# Patient Record
Sex: Male | Born: 1960
Health system: Southern US, Community
[De-identification: ages and names within clinical notes are randomized; demographics above are authoritative.]

## PROBLEM LIST (undated history)

## (undated) DIAGNOSIS — I10 Essential (primary) hypertension: Secondary | ICD-10-CM

## (undated) DIAGNOSIS — C4490 Unspecified malignant neoplasm of skin, unspecified: Secondary | ICD-10-CM

## (undated) DIAGNOSIS — E669 Obesity, unspecified: Secondary | ICD-10-CM

## (undated) DIAGNOSIS — J302 Other seasonal allergic rhinitis: Secondary | ICD-10-CM

## (undated) DIAGNOSIS — I519 Heart disease, unspecified: Secondary | ICD-10-CM

## (undated) DIAGNOSIS — I4819 Other persistent atrial fibrillation: Secondary | ICD-10-CM

## (undated) DIAGNOSIS — I209 Angina pectoris, unspecified: Secondary | ICD-10-CM

## (undated) DIAGNOSIS — F32A Depression, unspecified: Secondary | ICD-10-CM

## (undated) DIAGNOSIS — F329 Major depressive disorder, single episode, unspecified: Secondary | ICD-10-CM

## (undated) HISTORY — DX: Essential (primary) hypertension: I10

## (undated) HISTORY — DX: Obesity, unspecified: E66.9

## (undated) HISTORY — PX: WISDOM TOOTH EXTRACTION: SHX21

## (undated) HISTORY — DX: Depression, unspecified: F32.A

## (undated) HISTORY — PX: COLONOSCOPY: SHX174

## (undated) HISTORY — DX: Major depressive disorder, single episode, unspecified: F32.9

---

## 2011-07-20 ENCOUNTER — Encounter: Payer: Self-pay | Admitting: Internal Medicine

## 2011-07-21 ENCOUNTER — Encounter: Payer: Self-pay | Admitting: Family Medicine

## 2011-07-21 ENCOUNTER — Ambulatory Visit (INDEPENDENT_AMBULATORY_CARE_PROVIDER_SITE_OTHER): Payer: BC Managed Care – PPO | Admitting: Family Medicine

## 2011-07-21 VITALS — BP 124/88 | HR 76 | Temp 100.0°F | Wt 176.0 lb

## 2011-07-21 DIAGNOSIS — J069 Acute upper respiratory infection, unspecified: Secondary | ICD-10-CM

## 2011-07-21 DIAGNOSIS — Z8659 Personal history of other mental and behavioral disorders: Secondary | ICD-10-CM

## 2011-07-21 NOTE — Progress Notes (Signed)
  Subjective:    Patient ID: Daniel Romero, male    DOB: [birth year], 51 y.o.   MRN: [id number]  HPI He has a four-day history started with cough followed by sneezing, myalgias and fatigue. No sore throat, earache. He has a new job. He did take half day off of work. He can use on meds as listed in the chart. He is being followed by Dr. Emerson Monte. He does plan to start back into counseling. He recognizes the need to not just rely on medications to help deal with some of his life stresses. Review of Systems     Objective:   Physical Exam alert and in no distress. Tympanic membranes and canals are normal. Throat is clear. Tonsils are normal. Neck is supple without adenopathy or thyromegaly. Cardiac exam shows a regular sinus rhythm without murmurs or gallops. Lungs are clear to auscultation.        Assessment & Plan:   1. Acute URI   2. H/O dysthymia    supportive care for the URI. 50% of the time was spent discussing stress and stress management. He recognizes the need to be on medications and is now willing to put some effort into changing how he response to stressful situations

## 2011-07-21 NOTE — Patient Instructions (Signed)
Drink plenty of fluids. Treat your symptoms.Upper Respiratory Infection, Adult An upper respiratory infection (URI) is also known as the common cold. It is often caused by a type of germ (virus). Colds are easily spread (contagious). You can pass it to others by kissing, coughing, sneezing, or drinking out of the same glass. Usually, you get better in 1 or 2 weeks.  HOME CARE   Only take medicine as told by your doctor.   Use a warm mist humidifier or breathe in steam from a hot shower.   Drink enough water and fluids to keep your pee (urine) clear or pale yellow.   Get plenty of rest.   Return to work when your temperature is back to normal or as told by your doctor. You may use a face mask and wash your hands to stop your cold from spreading.  GET HELP RIGHT AWAY IF:   After the first few days, you feel you are getting worse.   You have questions about your medicine.   You have chills, shortness of breath, or brown or red spit (mucus).   You have yellow or brown snot (nasal discharge) or pain in the face, especially when you bend forward.   You have a fever, puffy (swollen) neck, pain when you swallow, or white spots in the back of your throat.   You have a bad headache, ear pain, sinus pain, or chest pain.   You have a high-pitched whistling sound when you breathe in and out (wheezing).   You have a lasting cough or cough up blood.   You have sore muscles or a stiff neck.  MAKE SURE YOU:   Understand these instructions.   Will watch your condition.   Will get help right away if you are not doing well or get worse.  Document Released: 08/24/2007 Document Revised: 02/24/2011 Document Reviewed: 07/12/2010 [facility] Patient Information 2012 [place], [place].

## 2012-04-11 ENCOUNTER — Encounter: Payer: BC Managed Care – PPO | Admitting: Family Medicine

## 2012-04-13 ENCOUNTER — Encounter: Payer: Self-pay | Admitting: Family Medicine

## 2012-04-13 ENCOUNTER — Ambulatory Visit (INDEPENDENT_AMBULATORY_CARE_PROVIDER_SITE_OTHER): Payer: BC Managed Care – PPO | Admitting: Family Medicine

## 2012-04-13 VITALS — BP 120/78 | HR 60 | Ht 71.0 in | Wt 213.0 lb

## 2012-04-13 DIAGNOSIS — F3289 Other specified depressive episodes: Secondary | ICD-10-CM

## 2012-04-13 DIAGNOSIS — F329 Major depressive disorder, single episode, unspecified: Secondary | ICD-10-CM

## 2012-04-13 DIAGNOSIS — F32A Depression, unspecified: Secondary | ICD-10-CM

## 2012-04-13 DIAGNOSIS — Z0000 Encounter for general adult medical examination without abnormal findings: Secondary | ICD-10-CM

## 2012-04-13 DIAGNOSIS — Z23 Encounter for immunization: Secondary | ICD-10-CM

## 2012-04-13 DIAGNOSIS — L989 Disorder of the skin and subcutaneous tissue, unspecified: Secondary | ICD-10-CM

## 2012-04-13 LAB — COMPREHENSIVE METABOLIC PANEL
ALT: 28 U/L (ref 0–53)
Albumin: 4.6 g/dL (ref 3.5–5.2)
Alkaline Phosphatase: 65 U/L (ref 39–117)
Glucose, Bld: 88 mg/dL (ref 70–99)
Potassium: 4.1 mEq/L (ref 3.5–5.3)
Sodium: 141 mEq/L (ref 135–145)
Total Bilirubin: 0.6 mg/dL (ref 0.3–1.2)
Total Protein: 6.8 g/dL (ref 6.0–8.3)

## 2012-04-13 LAB — CBC WITH DIFFERENTIAL/PLATELET
Basophils Relative: 0 % (ref 0–1)
Eosinophils Absolute: 0.3 10*3/uL (ref 0.0–0.7)
Hemoglobin: 16.3 g/dL (ref 13.0–17.0)
Lymphs Abs: 1.7 10*3/uL (ref 0.7–4.0)
MCHC: 35.3 g/dL (ref 30.0–36.0)
Monocytes Relative: 8 % (ref 3–12)
Neutro Abs: 4.1 10*3/uL (ref 1.7–7.7)
Neutrophils Relative %: 61 % (ref 43–77)
Platelets: 303 10*3/uL (ref 150–400)
RBC: 5.4 MIL/uL (ref 4.22–5.81)

## 2012-04-13 LAB — HEMOCCULT GUIAC POC 1CARD (OFFICE)

## 2012-04-13 LAB — LIPID PANEL
LDL Cholesterol: 121 mg/dL — ABNORMAL HIGH (ref 0–99)
Total CHOL/HDL Ratio: 3.7 Ratio
VLDL: 20 mg/dL (ref 0–40)

## 2012-04-13 NOTE — Progress Notes (Signed)
Subjective:    Patient ID: Daniel Romero, male    DOB: [birth year], 52 y.o.   MRN: [id number]  HPI He is here for complete examination. He does have a several year history of intermittent left medial knee pain that occurs monthly. No popping, locking, grinding or effusion. He also complains of a 5 year history of a pinching feeling in the left lower abdominal is worse with physical activity especially bending. He continues on Prozac and Neurontin and is followed by Dr. Loralie Champagne office the this seems to be going quite well. He also has several lesions that he would like me to evaluate. Otherwise he has no particular concerns or complaints. His work is going well. He apparently is dating someone on a regular basis. His family history is noncontributory.   Review of Systems  Constitutional: Negative.   HENT: Negative.   Eyes: Negative.   Respiratory: Negative.   Cardiovascular: Negative.   Gastrointestinal: Negative.   Genitourinary: Negative.   Musculoskeletal: Negative.   Skin: Negative.   Neurological: Negative.   Hematological: Negative.   Psychiatric/Behavioral: Negative.        Objective:   Physical Exam BP 120/78  Pulse 60  Ht 5\' 11"  (1.803 m)  Wt 213 lb (96.616 kg)  BMI 29.71 kg/m2  SpO2 98%  General Appearance:    Alert, cooperative, no distress, appears stated age  Head:    Normocephalic, without obvious abnormality, atraumatic  Eyes:    PERRL, conjunctiva/corneas clear, EOM's intact, fundi    benign  Ears:    Normal TM's and external ear canals  Nose:   Nares normal, mucosa normal, no drainage or sinus   tenderness  Throat:   Lips, mucosa, and tongue normal; teeth and gums normal  Neck:   Supple, no lymphadenopathy;  thyroid:  no   enlargement/tenderness/nodules; no carotid   bruit or JVD  Back:    Spine nontender, no curvature, ROM normal, no CVA     tenderness  Lungs:     Clear to auscultation bilaterally without wheezes, rales or     ronchi; respirations  unlabored  Chest Wall:    No tenderness or deformity   Heart:    Regular rate and rhythm, S1 and S2 normal, no murmur, rub   or gallop  Breast Exam:    No chest wall tenderness, masses or gynecomastia  Abdomen:     Soft, non-tender, nondistended, normoactive bowel sounds,    no masses, no hepatosplenomegaly  Genitalia:    Normal male external genitalia without lesions.  Testicles without masses.  No inguinal hernias.  Rectal:    Normal sphincter tone, no masses or tenderness; guaiac negative stool.  Prostate smooth, no nodules, not enlarged.  Extremities:   No clubbing, cyanosis or edema left knee exam shows negative McMurray's testing, anterior drawer. No tenderness over the medial joint line. No effusion noted.   Pulses:   2+ and symmetric all extremities  Skin:   Skin color, texture, turgor normal, no rashes, 0.5 cm lesion noted behind his right ear that is slightly raised.   Lymph nodes:   Cervical, supraclavicular, and axillary nodes normal  Neurologic:   CNII-XII intact, normal strength, sensation and gait; reflexes 2+ and symmetric throughout          Psych:   Normal mood, affect, hygiene and grooming.    Microscopic examination showed no red cells.       Assessment & Plan:   1. Routine general medical examination at a health care  facility  POCT Urinalysis Dipstick, Tdap vaccine greater than or equal to 7yo IM, Lipid panel, CBC with Differential, Comprehensive metabolic panel, Hemoccult - 1 Card (office)  2. Skin lesion    3. Depression      he will return here for lesion excision. Discussed the knee pain with him. Recommend conservative care since is causing him very little difficulty. He will continue to be followed by psychiatry.

## 2012-04-16 NOTE — Progress Notes (Signed)
Quick Note:  CALLED PT CELL # TO INFORM HIM LABS OK LEFT MESSAGE ______

## 2012-04-20 ENCOUNTER — Ambulatory Visit (INDEPENDENT_AMBULATORY_CARE_PROVIDER_SITE_OTHER): Payer: BC Managed Care – PPO | Admitting: Family Medicine

## 2012-04-20 DIAGNOSIS — L989 Disorder of the skin and subcutaneous tissue, unspecified: Secondary | ICD-10-CM

## 2012-04-20 NOTE — Progress Notes (Signed)
  Subjective:    Patient ID: Daniel Romero, male    DOB: 1960-07-11, 52 y.o.   MRN: [id number]  HPI He has a 0.5 cm raised lesions in the right posterior auricular area that he would like removed.   Review of Systems     Objective:   Physical Exam 0.5 cm raised dry a lesion is noted in the right auricular area. It was injected with Xylocaine and epinephrine. A 2 mm wedge biopsy was used and the lesion was removed without difficulty.       Assessment & Plan:   1. Skin lesion    the lesion was sent for pathologic evaluation.

## 2012-04-27 ENCOUNTER — Telehealth: Payer: Self-pay

## 2012-04-27 NOTE — Telephone Encounter (Signed)
PT INFORMED PLACE WAS PRE-CANCEROUS AND TO KEEP A WATCH ON ANY NEW PLACES TO POP UP PT VERBALIZED UNDERSTANDING

## 2012-05-02 ENCOUNTER — Encounter: Payer: Self-pay | Admitting: Family Medicine

## 2013-03-29 ENCOUNTER — Ambulatory Visit (INDEPENDENT_AMBULATORY_CARE_PROVIDER_SITE_OTHER): Payer: 59 | Admitting: Family Medicine

## 2013-03-29 ENCOUNTER — Encounter: Payer: Self-pay | Admitting: Family Medicine

## 2013-03-29 VITALS — BP 158/110 | HR 68 | Wt 203.0 lb

## 2013-03-29 DIAGNOSIS — J209 Acute bronchitis, unspecified: Secondary | ICD-10-CM

## 2013-03-29 DIAGNOSIS — IMO0001 Reserved for inherently not codable concepts without codable children: Secondary | ICD-10-CM

## 2013-03-29 DIAGNOSIS — R030 Elevated blood-pressure reading, without diagnosis of hypertension: Secondary | ICD-10-CM

## 2013-03-29 MED ORDER — AMOXICILLIN 875 MG PO TABS: 875.0000 mg | ORAL_TABLET | Freq: Two times a day (BID) | ORAL | Status: DC

## 2013-03-29 NOTE — Patient Instructions (Signed)
To call the antibiotic and if not totally back to normal when you finish the medical

## 2013-03-29 NOTE — Progress Notes (Signed)
   Subjective:    Patient ID: Daniel Romero, male    DOB: 1960-11-11, 53 y.o.   MRN: [id number]  HPI One month ago he developed difficulty with cough and chest congestion. Approximately 3 weeks ago he developed fever, chills, myalgias, headache and worsening cough and congestion. No sore throat, earache, change in appetite. Over the last week his cough and congestion have essentially remained unchanged. He does not smoke. He continues on medications listed in the chart.   Review of Systems     Objective:   Physical Exam alert and in no distress. Tympanic membranes and canals are normal. Throat is clear. Tonsils are normal. Neck is supple without adenopathy or thyromegaly. Cardiac exam shows a regular sinus rhythm without murmurs or gallops. Lungs shows scattered rhonchi. Review of his record indicates previous blood pressures have been normal.        Assessment & Plan:  Acute bronchitis - Plan: amoxicillin (AMOXIL) 875 MG tablet  Elevated blood pressure  he will take all the antibiotic and call me if not better. I will also have him return here in one month for blood pressure recheck.

## 2013-04-29 ENCOUNTER — Encounter: Payer: Self-pay | Admitting: Family Medicine

## 2013-04-29 ENCOUNTER — Ambulatory Visit (INDEPENDENT_AMBULATORY_CARE_PROVIDER_SITE_OTHER): Payer: 59 | Admitting: Family Medicine

## 2013-04-29 VITALS — BP 150/100 | HR 60 | Wt 209.0 lb

## 2013-04-29 DIAGNOSIS — I10 Essential (primary) hypertension: Secondary | ICD-10-CM

## 2013-04-29 DIAGNOSIS — F17200 Nicotine dependence, unspecified, uncomplicated: Secondary | ICD-10-CM

## 2013-04-29 MED ORDER — LISINOPRIL-HYDROCHLOROTHIAZIDE 10-12.5 MG PO TABS: 1.0000 | ORAL_TABLET | Freq: Every day | ORAL | Status: DC

## 2013-04-29 NOTE — Progress Notes (Signed)
   Subjective:    Patient ID: Daniel Romero, male    DOB: [birth year], 53 y.o.   MRN: [id number]  HPI He is here for recheck on his blood pressure. He also indicates that he smokes and has not mentioned this in the past.   Review of Systems     Objective:   Physical Exam Alert and in no distress. Blood pressure is recorded.       Assessment & Plan:  Hypertension - Plan: lisinopril-hydrochlorothiazide (PRINZIDE,ZESTORETIC) 10-12.5 MG per tablet  Current smoker  discussed treatment of his hypertension in regard to medications, diet, exercise, smoking, other medications. He will make an attempt to make diet and exercise changes. I will place him on blood pressure medication. Discussed the fact that if he does make lifestyle changes and gets his weight down and gets into a regular exercise program but we could consider stopping the medication. Very low, spent concerning his smoking. Recheck here one month.

## 2013-05-27 ENCOUNTER — Ambulatory Visit: Payer: 59 | Admitting: Medical

## 2013-06-03 ENCOUNTER — Ambulatory Visit: Payer: 59 | Admitting: Medical

## 2013-06-03 ENCOUNTER — Encounter: Payer: Self-pay | Admitting: Family Medicine

## 2013-06-03 ENCOUNTER — Ambulatory Visit (INDEPENDENT_AMBULATORY_CARE_PROVIDER_SITE_OTHER): Payer: 59 | Admitting: Family Medicine

## 2013-06-03 VITALS — BP 114/84 | HR 60 | Wt 211.0 lb

## 2013-06-03 DIAGNOSIS — Z87891 Personal history of nicotine dependence: Secondary | ICD-10-CM

## 2013-06-03 DIAGNOSIS — I10 Essential (primary) hypertension: Secondary | ICD-10-CM

## 2013-06-03 DIAGNOSIS — F1011 Alcohol abuse, in remission: Secondary | ICD-10-CM

## 2013-06-03 MED ORDER — LISINOPRIL-HYDROCHLOROTHIAZIDE 10-12.5 MG PO TABS: 1.0000 | ORAL_TABLET | Freq: Every day | ORAL | Status: DC

## 2013-06-03 NOTE — Progress Notes (Signed)
   Subjective:    Patient ID: Daniel Romero, male    DOB: 03/30/1960, 53 y.o.   MRN: [id number]  HPI He is here for a recheck after starting on lisinopril. He has no cough or swelling. Also of note is he recently quit smoking. He has also been taking Prozac and Neurontin given to him to Dr. Tomasita Crumble [facility]'s office. The gentleman he is seeing Dr. apparently has retired and he would like to potentially come off this medication. He was not involved in any kind of counseling while there. Apparently the depression was work related and he states that this is doing much better now. In the past he had been in counseling with Josph Macho May.   Review of Systems     Objective:   Physical Exam Alert and in no distress. Blood pressure is recorded.       Assessment & Plan:  Hypertension - Plan: lisinopril-hydrochlorothiazide (PRINZIDE,ZESTORETIC) 10-12.5 MG per tablet  History of alcohol abuse  Former smoker  his blood pressure medication will be renewed. Discussed counseling with him in regard to handling stress and stress management. We discussed how he is handling his alcohol and apparently he does go to [employer]. He also is using similar techniques on quitting smoking and finds them useful. I encouraged him to get back involved in counseling to help deal with other stress-related issues explaining that stress will, at some point down the Road and now sometimes when how to handle it better. May consider stopping his medications at a later date.

## 2013-06-03 NOTE — Patient Instructions (Signed)
Followup with Fred May

## 2014-06-16 ENCOUNTER — Other Ambulatory Visit: Payer: Self-pay | Admitting: Family Medicine

## 2014-07-04 ENCOUNTER — Encounter: Payer: Self-pay | Admitting: Medical

## 2014-07-04 ENCOUNTER — Ambulatory Visit (INDEPENDENT_AMBULATORY_CARE_PROVIDER_SITE_OTHER): Payer: 59 | Admitting: Medical

## 2014-07-04 VITALS — BP 110/80 | HR 52 | Temp 98.2°F | Resp 15 | Wt 190.0 lb

## 2014-07-04 DIAGNOSIS — S39012A Strain of muscle, fascia and tendon of lower back, initial encounter: Secondary | ICD-10-CM | POA: Diagnosis not present

## 2014-07-04 DIAGNOSIS — M62830 Muscle spasm of back: Secondary | ICD-10-CM

## 2014-07-04 MED ORDER — CYCLOBENZAPRINE HCL 10 MG PO TABS: 10.0000 mg | ORAL_TABLET | Freq: Every day | ORAL | Status: DC

## 2014-07-04 NOTE — Progress Notes (Signed)
  Subjective:    Daniel Romero is a 54 y.o. male who presents for evaluation of low back pain. The patient has had recurrent self limited episodes of low back pain in the past. Symptoms have been present for 1 week and are unchanged.  Onset was related to / precipitated by lifting a heavy object and was moving a 200 lb wasing machine on a hand truck down steps last week when he felt pain and knew he had pullsed his back. The pain is located in the right lumbar area and does not radiate. The pain is described as aching, soreness and stiffness and occurs all day.   Symptoms are exacerbated by exercise, extension, flexion, lifting and sitting. Symptoms are improved by NSAIDs. He has also tried nothing which provided no symptom relief. He has no other symptoms associated with the back pain. The patient has no "red flag" history indicative of complicated back pain.  The following portions of the patient's history were reviewed and updated as appropriate: allergies, current medications, past family history, past medical history, past social history, past surgical history and problem list.  Review of Systems As in subjective     Objective:    Filed Vitals:   07/04/14 1213  BP: 110/80  Pulse: 52  Temp: 98.2 F (36.8 C)  Resp: 15    General appearance: alert, no distress, WD/WN Neck: supple, no lymphadenopathy, no thyromegaly, no masses, normal ROM Abdomen: +bs, soft, non tender, non distended, no masses, no hepatomegaly, no splenomegaly, no bruits Back: tender right low back paraspinal with +spasm, flexion and extension limited due to pain, otherwise no scoliosis, no other tendnerss, -SLR MSK: UE nontender, normal ROM, LE nontender, normal ROM, no obvious deformity Extremities: no edema, no cyanosis, no clubbing Pulses: 2+ symmetric, upper and lower extremities     Assessment:   Encounter Diagnoses  Name Primary?  . Back strain, initial encounter Yes  . Muscle spasm of back      Plan:     Natural history and expected course discussed. Questions answered. Neurosurgeon distributed. Proper lifting, bending technique discussed. Stretching exercises discussed. Regular aerobic and trunk strengthening exercises discussed. Short (3-4 day) period of relative rest recommended until acute symptoms improve. Heat to affected area as needed for local pain relief.  Medications prescribed: NSAIDs - 2 Aleve OTC BID for the next 3-5 days Muscle relaxants per medication orders.  Note given for work for today  Follow up: prn

## 2014-12-11 ENCOUNTER — Ambulatory Visit (INDEPENDENT_AMBULATORY_CARE_PROVIDER_SITE_OTHER): Payer: 59 | Admitting: Family Medicine

## 2014-12-11 ENCOUNTER — Encounter: Payer: Self-pay | Admitting: Family Medicine

## 2014-12-11 VITALS — BP 112/60 | HR 55 | Ht 70.5 in | Wt 171.0 lb

## 2014-12-11 DIAGNOSIS — Z0000 Encounter for general adult medical examination without abnormal findings: Secondary | ICD-10-CM

## 2014-12-11 DIAGNOSIS — Z1211 Encounter for screening for malignant neoplasm of colon: Secondary | ICD-10-CM

## 2014-12-11 DIAGNOSIS — I10 Essential (primary) hypertension: Secondary | ICD-10-CM | POA: Diagnosis not present

## 2014-12-11 DIAGNOSIS — N509 Disorder of male genital organs, unspecified: Secondary | ICD-10-CM | POA: Diagnosis not present

## 2014-12-11 DIAGNOSIS — Z209 Contact with and (suspected) exposure to unspecified communicable disease: Secondary | ICD-10-CM

## 2014-12-11 DIAGNOSIS — Z87891 Personal history of nicotine dependence: Secondary | ICD-10-CM | POA: Diagnosis not present

## 2014-12-11 DIAGNOSIS — F1010 Alcohol abuse, uncomplicated: Secondary | ICD-10-CM | POA: Diagnosis not present

## 2014-12-11 DIAGNOSIS — F1011 Alcohol abuse, in remission: Secondary | ICD-10-CM

## 2014-12-11 DIAGNOSIS — M722 Plantar fascial fibromatosis: Secondary | ICD-10-CM | POA: Diagnosis not present

## 2014-12-11 LAB — COMPREHENSIVE METABOLIC PANEL
ALBUMIN: 4.7 g/dL (ref 3.6–5.1)
ALK PHOS: 53 U/L (ref 40–115)
ALT: 18 U/L (ref 9–46)
AST: 19 U/L (ref 10–35)
BILIRUBIN TOTAL: 0.8 mg/dL (ref 0.2–1.2)
BUN: 14 mg/dL (ref 7–25)
CO2: 25 mmol/L (ref 20–31)
Calcium: 9.3 mg/dL (ref 8.6–10.3)
Chloride: 101 mmol/L (ref 98–110)
Creat: 0.71 mg/dL (ref 0.70–1.33)
Glucose, Bld: 72 mg/dL (ref 65–99)
Potassium: 3.8 mmol/L (ref 3.5–5.3)
SODIUM: 140 mmol/L (ref 135–146)
TOTAL PROTEIN: 6.8 g/dL (ref 6.1–8.1)

## 2014-12-11 LAB — CBC WITH DIFFERENTIAL/PLATELET
BASOS PCT: 0 % (ref 0–1)
Basophils Absolute: 0 10*3/uL (ref 0.0–0.1)
Eosinophils Absolute: 0.3 10*3/uL (ref 0.0–0.7)
Eosinophils Relative: 3 % (ref 0–5)
HCT: 45.7 % (ref 39.0–52.0)
HEMOGLOBIN: 15.9 g/dL (ref 13.0–17.0)
Lymphocytes Relative: 20 % (ref 12–46)
Lymphs Abs: 1.7 10*3/uL (ref 0.7–4.0)
MCH: 30 pg (ref 26.0–34.0)
MCHC: 34.8 g/dL (ref 30.0–36.0)
MCV: 86.2 fL (ref 78.0–100.0)
MPV: 9.2 fL (ref 8.6–12.4)
Monocytes Absolute: 0.4 10*3/uL (ref 0.1–1.0)
Monocytes Relative: 5 % (ref 3–12)
NEUTROS ABS: 6.1 10*3/uL (ref 1.7–7.7)
NEUTROS PCT: 72 % (ref 43–77)
Platelets: 273 10*3/uL (ref 150–400)
RBC: 5.3 MIL/uL (ref 4.22–5.81)
RDW: 13.4 % (ref 11.5–15.5)
WBC: 8.5 10*3/uL (ref 4.0–10.5)

## 2014-12-11 LAB — POCT URINALYSIS DIPSTICK
BILIRUBIN UA: NEGATIVE
Blood, UA: POSITIVE
Glucose, UA: NEGATIVE
Ketones, UA: 0.5
LEUKOCYTES UA: NEGATIVE
Nitrite, UA: NEGATIVE
PROTEIN UA: NEGATIVE
Spec Grav, UA: >=1.03
Urobilinogen, UA: NEGATIVE
pH, UA: 6

## 2014-12-11 LAB — LIPID PANEL
CHOLESTEROL: 176 mg/dL (ref 125–200)
HDL: 76 mg/dL (ref >=40)
LDL Cholesterol: 86 mg/dL (ref <130)
TRIGLYCERIDES: 69 mg/dL (ref <150)
Total CHOL/HDL Ratio: 2.3 Ratio (ref <=5.0)
VLDL: 14 mg/dL (ref <30)

## 2014-12-11 NOTE — Progress Notes (Signed)
Subjective:    Patient ID: Daniel Romero, male    DOB: [birth year], 54 y.o.   MRN: [id number]  HPI He is here for a complete examination. He is getting involved in a relationship with a woman and would like to be tested for STD to be safe. He apparently has not been sexually active recently. He does have a history of hypertension and a previous history of smoking and alcohol abuse. He has not had any alcohol in over 5 years. Does complain of plantar fasciitis and has had this for over a year. He has been intermittently doing exercises for this. He has had no chest pain, shortness breath, GI issues. Family and social history as well as health maintenance and immunizations were reviewed.   Review of Systems  All other systems reviewed and are negative.      Objective:   Physical Exam BP 112/60 mmHg  Pulse 55  Ht 5' 10.5" (1.791 m)  Wt 171 lb (77.565 kg)  BMI 24.18 kg/m2  SpO2 99%  General Appearance:    Alert, cooperative, no distress, appears stated age  Head:    Normocephalic, without obvious abnormality, atraumatic  Eyes:    PERRL, conjunctiva/corneas clear, EOM's intact, fundi    benign  Ears:    Normal TM's and external ear canals  Nose:   Nares normal, mucosa normal, no drainage or sinus   tenderness  Throat:   Lips, mucosa, and tongue normal; teeth and gums normal  Neck:   Supple, no lymphadenopathy;  thyroid:  no   enlargement/tenderness/nodules; no carotid   bruit or JVD  Back:    Spine nontender, no curvature, ROM normal, no CVA     tenderness  Lungs:     Clear to auscultation bilaterally without wheezes, rales or     ronchi; respirations unlabored  Chest Wall:    No tenderness or deformity   Heart:    Regular rate and rhythm, S1 and S2 normal, no murmur, rub   or gallop  Breast Exam:    No chest wall tenderness, masses or gynecomastia  Abdomen:     Soft, non-tender, nondistended, normoactive bowel sounds,    no masses, no hepatosplenomegaly  Genitalia:    Normal male  external genitalia without lesions.  Testicles are normal however a round one and half centimeter movable nontranalucent  lesion is noted superior to the testing on the left.  No inguinal hernias.  Rectal:    Deferred  Extremities:   No clubbing, cyanosis or edema.Left foot exam shows good motion. Tender to palpation over the calcaneal spur.  Pulses:   2+ and symmetric all extremities  Skin:   Skin color, texture, turgor normal, no rashes or lesions  Lymph nodes:   Cervical, supraclavicular, and axillary nodes normal  Neurologic:   CNII-XII intact, normal strength, sensation and gait; reflexes 2+ and symmetric throughout          Psych:   Normal mood, affect, hygiene and grooming.          Assessment & Plan:  Routine general medical examination at a health care facility - Plan: POCT Urinalysis Dipstick, CBC with Differential/Platelet, Comprehensive metabolic panel, Lipid panel  Essential hypertension - Plan: CBC with Differential/Platelet, Comprehensive metabolic panel  History of alcohol abuse - Plan: CBC with Differential/Platelet, Comprehensive metabolic panel  Former smoker  Special screening for malignant neoplasms, colon - Plan: Ambulatory referral to Gastroenterology  Plantar fasciitis of left foot  Contact with or exposure to communicable disease -  Plan: HIV antibody, RPR  Scrotal lesion - Plan: US Scrotum Instructions given for proper care of the plantar fasciitis. Encouraged him to do this regularly for at least one month. Also discussed STD testing and again recommended condoms for any sexual activity.

## 2014-12-11 NOTE — Patient Instructions (Signed)
Plantar Fasciitis  Plantar fasciitis is a common condition that causes foot pain. It is soreness (inflammation) of the band of tough fibrous tissue on the bottom of the foot that runs from the heel bone (calcaneus) to the ball of the foot. The cause of this soreness may be from excessive standing, poor fitting shoes, running on hard surfaces, being overweight, having an abnormal walk, or overuse (this is common in runners) of the painful foot or feet. It is also common in aerobic exercise dancers and ballet dancers.  SYMPTOMS   Most people with plantar fasciitis complain of:   Severe pain in the morning on the bottom of their foot especially when taking the first steps out of bed. This pain recedes after a few minutes of walking.   Severe pain is experienced also during walking following a long period of inactivity.   Pain is worse when walking barefoot or up stairs  DIAGNOSIS    Your caregiver will diagnose this condition by examining and feeling your foot.   Special tests such as X-rays of your foot, are usually not needed.  PREVENTION    Consult a sports medicine professional before beginning a new exercise program.   Walking programs offer a good workout. With walking there is a lower chance of overuse injuries common to runners. There is less impact and less jarring of the joints.   Begin all new exercise programs slowly. If problems or pain develop, decrease the amount of time or distance until you are at a comfortable level.   Wear good shoes and replace them regularly.   Stretch your foot and the heel cords at the back of the ankle (Achilles tendon) both before and after exercise.   Run or exercise on even surfaces that are not hard. For example, asphalt is better than pavement.   Do not run barefoot on hard surfaces.   If using a treadmill, vary the incline.   Do not continue to workout if you have foot or joint problems. Seek professional help if they do not improve.  HOME CARE INSTRUCTIONS     Avoid activities that cause you pain until you recover.   Use ice or cold packs on the problem or painful areas after working out.   Only take over-the-counter or prescription medicines for pain, discomfort, or fever as directed by your caregiver.   Soft shoe inserts or athletic shoes with air or gel sole cushions may be helpful.   If problems continue or become more severe, consult a sports medicine caregiver or your own health care Duong Haydel. Cortisone is a potent anti-inflammatory medication that may be injected into the painful area. You can discuss this treatment with your caregiver.  MAKE SURE YOU:    Understand these instructions.   Will watch your condition.   Will get help right away if you are not doing well or get worse.  Document Released: 11/30/2000 Document Revised: 05/30/2011 Document Reviewed: 01/30/2008  [employer] Patient Information 2015 [employer]. This information is not intended to replace advice given to you by your health care Deneen Slager. Make sure you discuss any questions you have with your health care Raymir Frommelt.

## 2014-12-12 ENCOUNTER — Encounter: Payer: Self-pay | Admitting: Internal Medicine

## 2014-12-12 LAB — RPR

## 2014-12-12 LAB — HIV ANTIBODY (ROUTINE TESTING W REFLEX): HIV 1&2 Ab, 4th Generation: NONREACTIVE

## 2014-12-15 ENCOUNTER — Ambulatory Visit (HOSPITAL_COMMUNITY)
Admission: RE | Admit: 2014-12-15 | Discharge: 2014-12-15 | Disposition: A | Discharge status: Home / Self Care | Payer: 59 | Source: Ambulatory Visit | Attending: Family Medicine | Admitting: Family Medicine

## 2014-12-15 DIAGNOSIS — N509 Disorder of male genital organs, unspecified: Secondary | ICD-10-CM

## 2014-12-15 DIAGNOSIS — N503 Cyst of epididymis: Secondary | ICD-10-CM | POA: Insufficient documentation

## 2014-12-15 DIAGNOSIS — N492 Inflammatory disorders of scrotum: Secondary | ICD-10-CM | POA: Insufficient documentation

## 2014-12-15 DIAGNOSIS — N433 Hydrocele, unspecified: Secondary | ICD-10-CM | POA: Insufficient documentation

## 2014-12-15 DIAGNOSIS — I861 Scrotal varices: Secondary | ICD-10-CM | POA: Diagnosis not present

## 2015-01-22 ENCOUNTER — Ambulatory Visit (AMBULATORY_SURGERY_CENTER): Payer: Self-pay | Admitting: *Deleted

## 2015-01-22 VITALS — Ht 71.0 in | Wt 174.0 lb

## 2015-01-22 DIAGNOSIS — Z1211 Encounter for screening for malignant neoplasm of colon: Secondary | ICD-10-CM

## 2015-01-22 NOTE — Progress Notes (Signed)
No egg or soy allergy No  past sedation No diet pills No home 02 use   emmi video declined

## 2015-02-05 ENCOUNTER — Encounter: Payer: Self-pay | Admitting: Internal Medicine

## 2015-02-05 ENCOUNTER — Ambulatory Visit (AMBULATORY_SURGERY_CENTER): Payer: 59 | Admitting: Internal Medicine

## 2015-02-05 VITALS — BP 127/86 | HR 57 | Temp 97.8°F | Resp 27 | Ht 71.0 in | Wt 174.0 lb

## 2015-02-05 DIAGNOSIS — Z1211 Encounter for screening for malignant neoplasm of colon: Secondary | ICD-10-CM

## 2015-02-05 MED ORDER — SODIUM CHLORIDE 0.9 % IV SOLN: 500.0000 mL | INTRAVENOUS | Status: DC

## 2015-02-05 NOTE — Progress Notes (Signed)
Patient awakening,vss,report to rn 

## 2015-02-05 NOTE — Patient Instructions (Addendum)
   Your colonoscopy was normal!  Next routine colonoscopy/screening test in 10 years - 2026  I appreciate the opportunity to care for you. Gatha Mayer, MD, FACG  YOU HAD AN ENDOSCOPIC PROCEDURE TODAY AT [facility]:   Refer to the procedure report that was given to you for any specific questions about what was found during the examination.  If the procedure report does not answer your questions, please call your gastroenterologist to clarify.  If you requested that your care partner not be given the details of your procedure findings, then the procedure report has been included in a sealed envelope for you to review at your convenience later.  YOU SHOULD EXPECT: Some feelings of bloating in the abdomen. Passage of more gas than usual.  Walking can help get rid of the air that was put into your GI tract during the procedure and reduce the bloating. If you had a lower endoscopy (such as a colonoscopy or flexible sigmoidoscopy) you may notice spotting of blood in your stool or on the toilet paper. If you underwent a bowel prep for your procedure, you may not have a normal bowel movement for a few days.  Please Note:  You might notice some irritation and congestion in your nose or some drainage.  This is from the oxygen used during your procedure.  There is no need for concern and it should clear up in a day or so.  SYMPTOMS TO REPORT IMMEDIATELY:   Following lower endoscopy (colonoscopy or flexible sigmoidoscopy):  Excessive amounts of blood in the stool  Significant tenderness or worsening of abdominal pains  Swelling of the abdomen that is new, acute  Fever of 100F or higher  For urgent or emergent issues, a gastroenterologist can be reached at any hour by calling [phone].   DIET: Your first meal following the procedure should be a small meal and then it is ok to progress to your normal diet. Heavy or fried foods are harder to digest and may make you feel  nauseous or bloated.  Likewise, meals heavy in dairy and vegetables can increase bloating.  Drink plenty of fluids but you should avoid alcoholic beverages for 24 hours.  ACTIVITY:  You should plan to take it easy for the rest of today and you should NOT DRIVE or use heavy machinery until tomorrow (because of the sedation medicines used during the test).    FOLLOW UP: Our staff will call the number listed on your records the next business day following your procedure to check on you and address any questions or concerns that you may have regarding the information given to you following your procedure. If we do not reach you, we will leave a message.  However, if you are feeling well and you are not experiencing any problems, there is no need to return our call.  We will assume that you have returned to your regular daily activities without incident.  SIGNATURES/CONFIDENTIALITY: You and/or your care partner have signed paperwork which will be entered into your electronic medical record.  These signatures attest to the fact that that the information above on your After Visit Summary has been reviewed and is understood.  Full responsibility of the confidentiality of this discharge information lies with you and/or your care-partner.  Please continue your normal medications

## 2015-02-05 NOTE — Op Note (Signed)
[place]  [address] [place], [id number]   COLONOSCOPY PROCEDURE REPORT  PATIENT: Daniel Romero, Daniel Romero  MR#: [id number] BIRTHDATE: [birth year] , 30  yrs. old GENDER: male ENDOSCOPIST: Gatha Mayer, MD, [facility] PROCEDURE DATE:  02/05/2015 PROCEDURE:   Colonoscopy, screening First Screening Colonoscopy - Avg.  risk and is 50 yrs.  old or older Yes.  Prior Negative Screening - Now for repeat screening. N/A  History of Adenoma - Now for follow-up colonoscopy & has been > or = to 3 yrs.  N/A  Polyps removed today? No Recommend repeat exam, <10 yrs? No ASA CLASS:   Class II INDICATIONS:Screening for colonic neoplasia and Colorectal Neoplasm Risk Assessment for this procedure is average risk. MEDICATIONS: Propofol 300 mg IV and Monitored anesthesia care  DESCRIPTION OF PROCEDURE:   After the risks benefits and alternatives of the procedure were thoroughly explained, informed consent was obtained.  The digital rectal exam revealed no abnormalities of the rectum, revealed the prostate was not enlarged, and revealed no prostatic nodules.   The LB [id number]-H190 [id number]  endoscope was introduced through the anus and advanced to the cecum, which was identified by both the appendix and ileocecal valve. No adverse events experienced.   The quality of the prep was (MiraLax was used) good.  The instrument was then slowly withdrawn as the colon was fully examined. Estimated blood loss is zero unless otherwise noted in this procedure report.      COLON FINDINGS: A normal appearing cecum, ileocecal valve, and appendiceal orifice were identified.  The ascending, transverse, descending, sigmoid colon, and rectum appeared unremarkable. Retroflexed views revealed no abnormalities. The time to cecum = 3.3 Withdrawal time = 9.2   The scope was withdrawn and the procedure completed. COMPLICATIONS: There were no immediate complications.  ENDOSCOPIC IMPRESSION: Normal colonoscopy - good prep  - first screening  RECOMMENDATIONS: Repeat colonoscopy 10 years.  eSigned:  Gatha Mayer, MD, [facility] 02/05/2015 3:09 PM   cc: The Patient and Jill Alexanders, MD

## 2015-02-06 ENCOUNTER — Telehealth: Payer: Self-pay | Admitting: *Deleted

## 2015-02-06 NOTE — Telephone Encounter (Signed)
  Follow up Call-  Call back number 02/05/2015  Post procedure Call Back phone  # [phone] cell  Permission to leave phone message Yes     Patient questions:  Do you have a fever, pain , or abdominal swelling? No. Pain Score  0 *  Have you tolerated food without any problems? Yes.    Have you been able to return to your normal activities? Yes.    Do you have any questions about your discharge instructions: Diet   No. Medications  No. Follow up visit  No.  Do you have questions or concerns about your Care? No.  Actions: * If pain score is 4 or above: No action needed, pain <4.

## 2015-06-24 ENCOUNTER — Telehealth: Payer: Self-pay | Admitting: Family Medicine

## 2015-06-24 MED ORDER — LISINOPRIL-HYDROCHLOROTHIAZIDE 10-12.5 MG PO TABS: 1.0000 | ORAL_TABLET | Freq: Every day | ORAL | Status: DC

## 2015-06-24 NOTE — Telephone Encounter (Signed)
done

## 2015-06-24 NOTE — Telephone Encounter (Signed)
Rcvd refill request for Lisinopril 10-12.5mg  #90

## 2015-12-09 ENCOUNTER — Other Ambulatory Visit: Payer: Self-pay | Admitting: Family Medicine

## 2016-05-03 ENCOUNTER — Other Ambulatory Visit: Payer: Self-pay | Admitting: Family Medicine

## 2016-06-24 ENCOUNTER — Other Ambulatory Visit: Payer: Self-pay | Admitting: Family Medicine

## 2016-07-04 ENCOUNTER — Encounter: Payer: Self-pay | Admitting: Family Medicine

## 2016-07-07 ENCOUNTER — Encounter: Payer: Self-pay | Admitting: Family Medicine

## 2016-07-21 ENCOUNTER — Ambulatory Visit (INDEPENDENT_AMBULATORY_CARE_PROVIDER_SITE_OTHER): Payer: 59 | Admitting: Family Medicine

## 2016-07-21 ENCOUNTER — Encounter: Payer: Self-pay | Admitting: Family Medicine

## 2016-07-21 VITALS — BP 128/80 | HR 76 | Ht 70.5 in | Wt 214.0 lb

## 2016-07-21 DIAGNOSIS — Z125 Encounter for screening for malignant neoplasm of prostate: Secondary | ICD-10-CM

## 2016-07-21 DIAGNOSIS — Z87898 Personal history of other specified conditions: Secondary | ICD-10-CM | POA: Diagnosis not present

## 2016-07-21 DIAGNOSIS — Z1159 Encounter for screening for other viral diseases: Secondary | ICD-10-CM | POA: Diagnosis not present

## 2016-07-21 DIAGNOSIS — I10 Essential (primary) hypertension: Secondary | ICD-10-CM | POA: Diagnosis not present

## 2016-07-21 DIAGNOSIS — Z0000 Encounter for general adult medical examination without abnormal findings: Secondary | ICD-10-CM | POA: Diagnosis not present

## 2016-07-21 DIAGNOSIS — F1011 Alcohol abuse, in remission: Secondary | ICD-10-CM

## 2016-07-21 DIAGNOSIS — Z87891 Personal history of nicotine dependence: Secondary | ICD-10-CM

## 2016-07-21 LAB — COMPREHENSIVE METABOLIC PANEL
ALT: 25 U/L (ref 9–46)
AST: 20 U/L (ref 10–35)
Albumin: 4.7 g/dL (ref 3.6–5.1)
Alkaline Phosphatase: 55 U/L (ref 40–115)
BILIRUBIN TOTAL: 0.7 mg/dL (ref 0.2–1.2)
BUN: 14 mg/dL (ref 7–25)
CHLORIDE: 103 mmol/L (ref 98–110)
CO2: 22 mmol/L (ref 20–31)
CREATININE: 1.02 mg/dL (ref 0.70–1.33)
Calcium: 9.7 mg/dL (ref 8.6–10.3)
GLUCOSE: 96 mg/dL (ref 65–99)
Potassium: 3.8 mmol/L (ref 3.5–5.3)
SODIUM: 139 mmol/L (ref 135–146)
Total Protein: 7.2 g/dL (ref 6.1–8.1)

## 2016-07-21 LAB — CBC WITH DIFFERENTIAL/PLATELET
Basophils Absolute: 0 cells/uL (ref 0–200)
Basophils Relative: 0 %
EOS PCT: 3 %
Eosinophils Absolute: 303 cells/uL (ref 15–500)
HCT: 48.1 % (ref 38.5–50.0)
Hemoglobin: 16.6 g/dL (ref 13.2–17.1)
LYMPHS ABS: 2424 {cells}/uL (ref 850–3900)
LYMPHS PCT: 24 %
MCH: 30.2 pg (ref 27.0–33.0)
MCHC: 34.5 g/dL (ref 32.0–36.0)
MCV: 87.6 fL (ref 80.0–100.0)
MONOS PCT: 6 %
MPV: 9.4 fL (ref 7.5–12.5)
Monocytes Absolute: 606 cells/uL (ref 200–950)
NEUTROS PCT: 67 %
Neutro Abs: 6767 cells/uL (ref 1500–7800)
PLATELETS: 311 10*3/uL (ref 140–400)
RBC: 5.49 MIL/uL (ref 4.20–5.80)
RDW: 13.4 % (ref 11.0–15.0)
WBC: 10.1 10*3/uL (ref 4.0–10.5)

## 2016-07-21 LAB — LIPID PANEL
CHOL/HDL RATIO: 3.5 ratio (ref <5.0)
Cholesterol: 205 mg/dL — ABNORMAL HIGH (ref <200)
HDL: 59 mg/dL (ref >40)
LDL CALC: 129 mg/dL — AB (ref <100)
TRIGLYCERIDES: 87 mg/dL (ref <150)
VLDL: 17 mg/dL (ref <30)

## 2016-07-21 MED ORDER — LISINOPRIL-HYDROCHLOROTHIAZIDE 10-12.5 MG PO TABS: ORAL_TABLET | ORAL | 3 refills | Status: DC

## 2016-07-21 NOTE — Progress Notes (Signed)
   Subjective:    Patient ID: Daniel Romero, male    DOB: 01/08/1961, 56 y.o.   MRN: [id number]  HPI He is here for complete examination. He continues on lisinopril/HCTZ and having no trouble with that. He has gained some weight since his last visit and does blame this on dietary changes. He was doing a modified Atkins diet but is no longer doing that. He has a previous history of alcohol abuse and smoking and has stayed free of these 2 problems quite well. He does go to [employer]. He is sexually active but is not married. Does complain of some slight left ankle discomfort. Family and social history as well as health maintenance and immunizations were reviewed.   Review of Systems  All other systems reviewed and are negative.      Objective:   Physical Exam BP 128/80   Pulse 76   Ht 5' 10.5" (1.791 m)   Wt 214 lb (97.1 kg)   SpO2 98%   BMI 30.27 kg/m   General Appearance:    Alert, cooperative, no distress, appears stated age  Head:    Normocephalic, without obvious abnormality, atraumatic  Eyes:    PERRL, conjunctiva/corneas clear, EOM's intact, fundi    benign  Ears:    Normal TM's and external ear canals  Nose:   Nares normal, mucosa normal, no drainage or sinus   tenderness  Throat:   Lips, mucosa, and tongue normal; teeth and gums normal  Neck:   Supple, no lymphadenopathy;  thyroid:  no   enlargement/tenderness/nodules; no carotid   bruit or JVD     Lungs:     Clear to auscultation bilaterally without wheezes, rales or     ronchi; respirations unlabored      Heart:    Regular rate and rhythm, S1 and S2 normal, no murmur, rub   or gallop     Abdomen:     Soft, non-tender, nondistended, normoactive bowel sounds,    no masses, no hepatosplenomegaly  Genitalia:    Normal male external genitalia without lesions.  Testicles without masses.  No inguinal hernias.  Rectal:   Deferred   Extremities:   No clubbing, cyanosis or edema  Pulses:   2+ and symmetric all extremities  Skin:    Skin color, texture, turgor normal, no rashes or lesions  Lymph nodes:   Cervical, supraclavicular, and axillary nodes normal  Neurologic:   CNII-XII intact, normal strength, sensation and gait; reflexes 2+ and symmetric throughout          Psych:   Normal mood, affect, hygiene and grooming.          Assessment & Plan:  Routine general medical examination at a health care facility - Plan: CBC with Differential/Platelet, Comprehensive metabolic panel, Lipid panel  Essential hypertension - Plan: lisinopril-hydrochlorothiazide (PRINZIDE,ZESTORETIC) 10-12.5 MG tablet, CBC with Differential/Platelet, Comprehensive metabolic panel  History of alcohol abuse  Former smoker  Need for hepatitis C screening test - Plan: Hepatitis C antibody He is stable on the above medications and diagnoses. I did recommend that he get back on his diet especially regard to cutting back on carbohydrates to get his weight down to a more appropriate level.

## 2016-07-22 LAB — HEPATITIS C ANTIBODY: HCV Ab: NEGATIVE

## 2016-07-22 LAB — PSA: PSA: 0.8 ng/mL (ref <=4.0)

## 2016-11-08 DIAGNOSIS — H35363 Drusen (degenerative) of macula, bilateral: Secondary | ICD-10-CM | POA: Diagnosis not present

## 2017-05-30 IMAGING — US US SCROTUM
1 series · 13 of 25 positions shown · non-contrast
Comparison: None in PACs

CLINICAL DATA: Left scrotal nodule

EXAM:
SCROTAL ULTRASOUND
DOPPLER ULTRASOUND OF THE TESTICLES
TECHNIQUE: Complete ultrasound examination of the testicles, epididymis, and
other scrotal structures was performed. Color and spectral Doppler
ultrasound were also utilized to evaluate blood flow to the
testicles.

[Series 1: us scrotum · 0.06mm/px · 67 acquisitions, 13 frames shown]
[im 1/67]
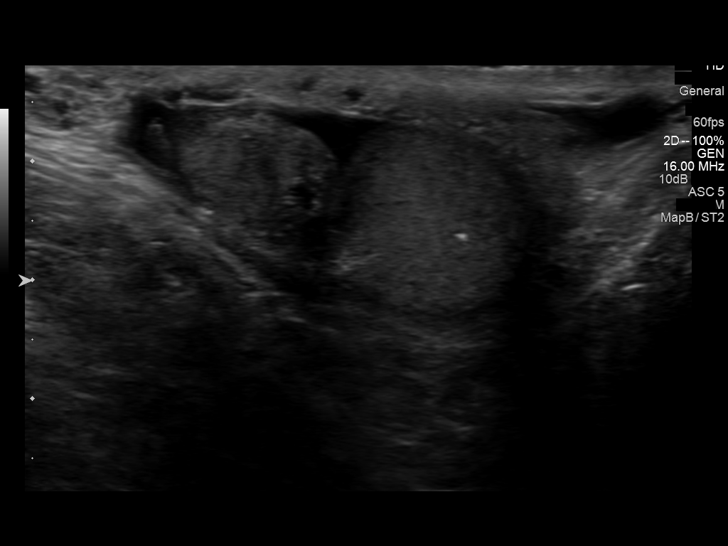
[im 6/67]
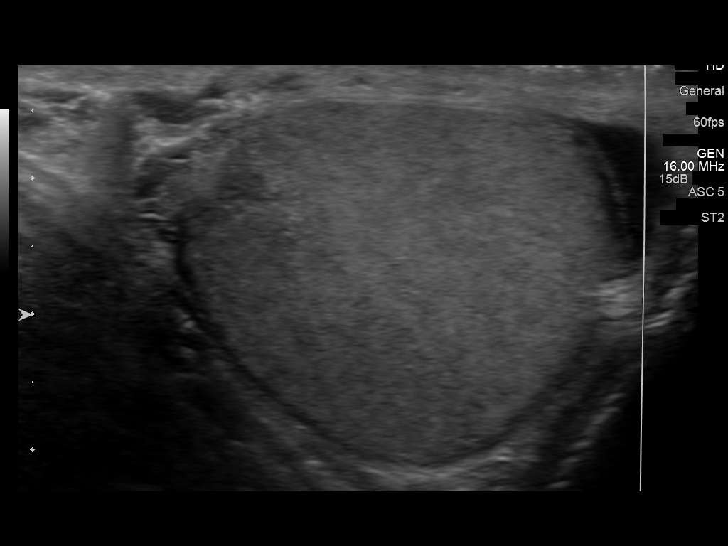
[im 12/67]
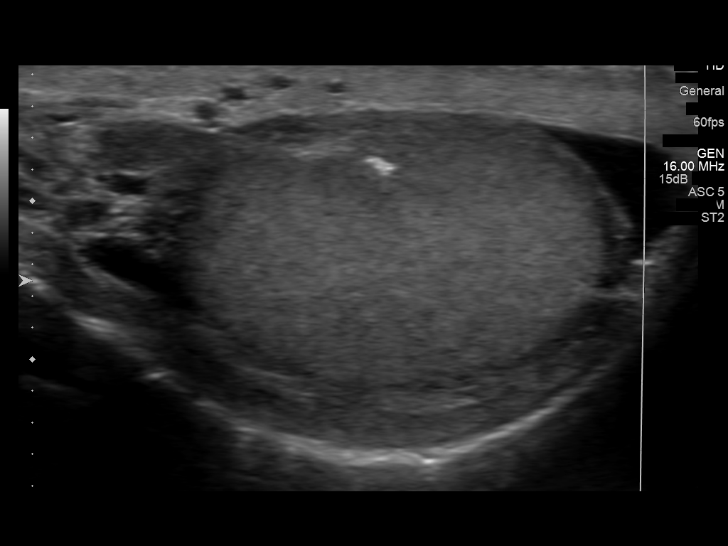
[im 17/67]
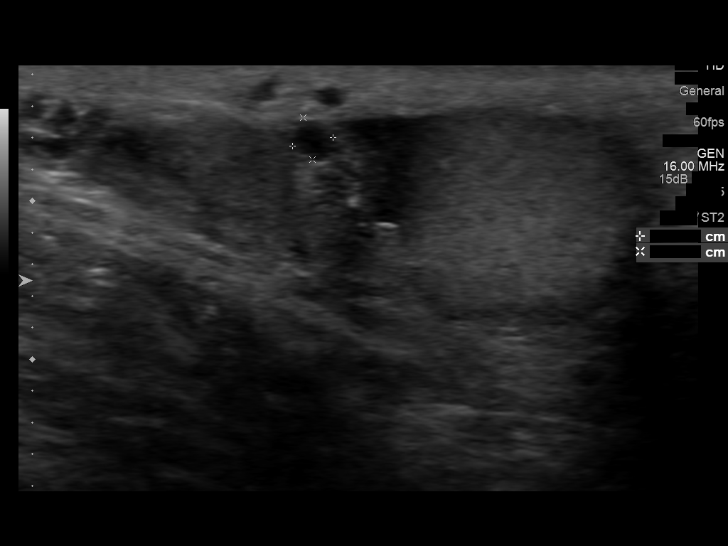
[im 23/67]
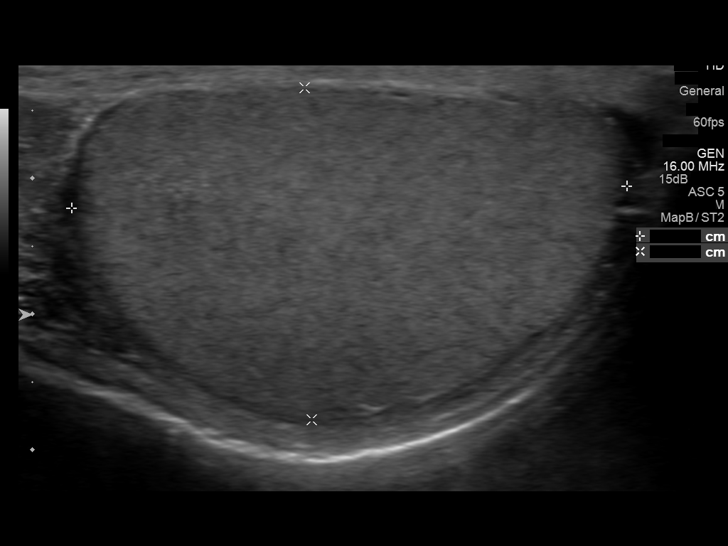
[im 28/67]
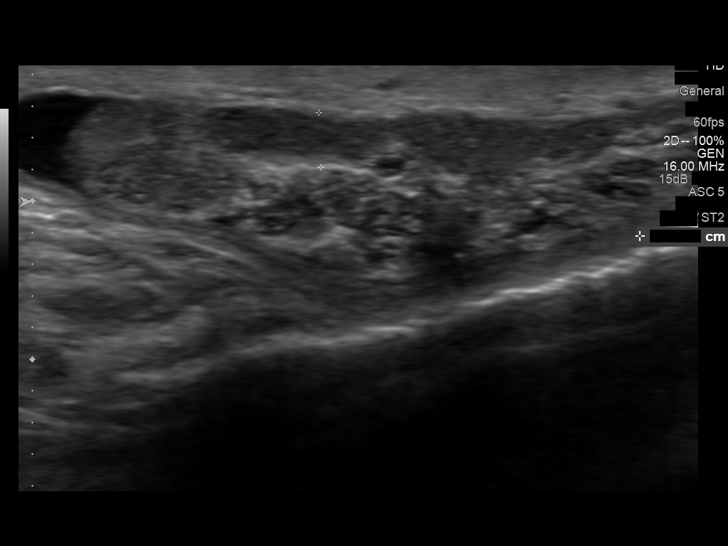
[im 34/67]
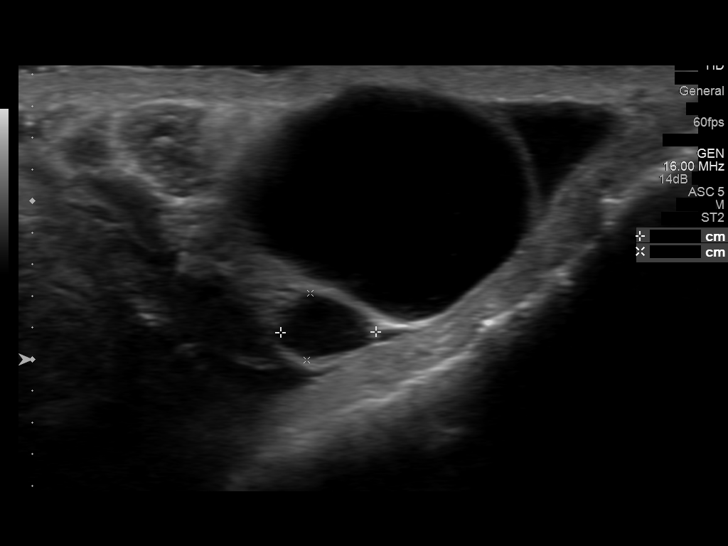
[im 39/67]
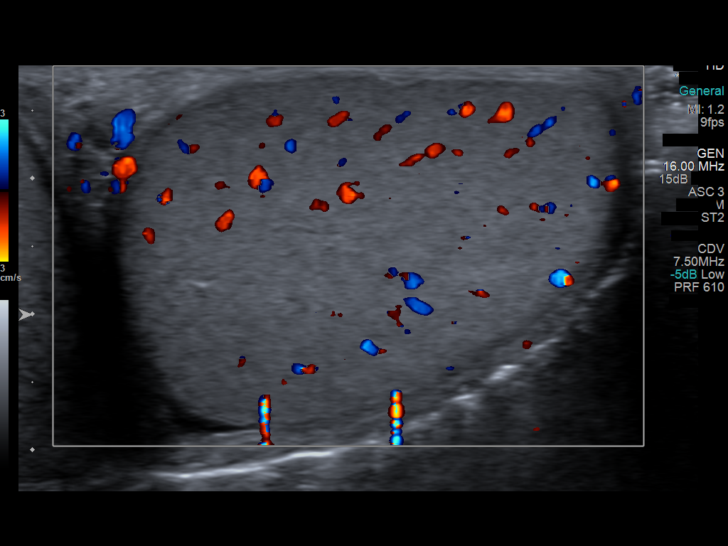
[im 45/67]
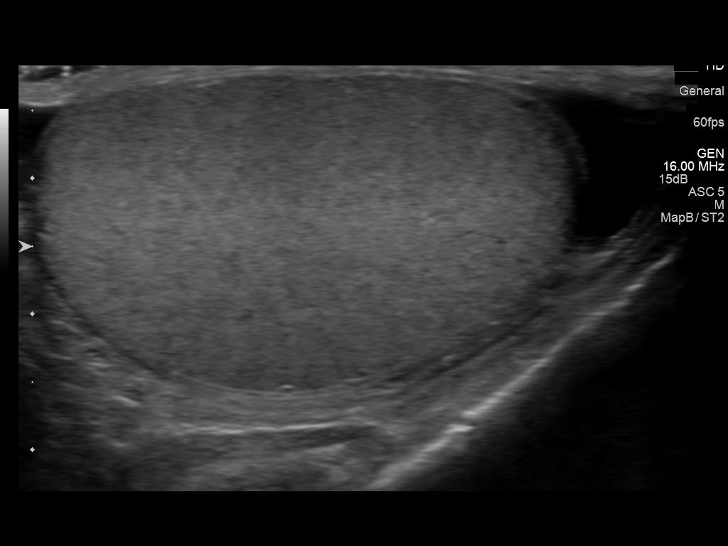
[im 50/67]
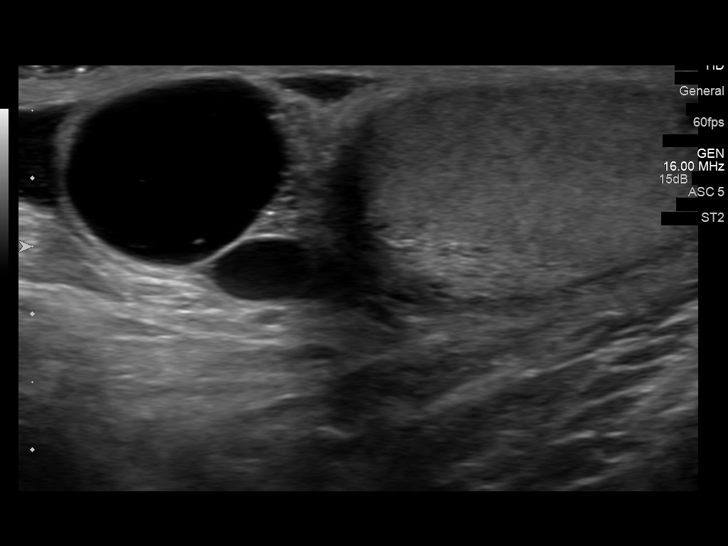
[im 56/67]
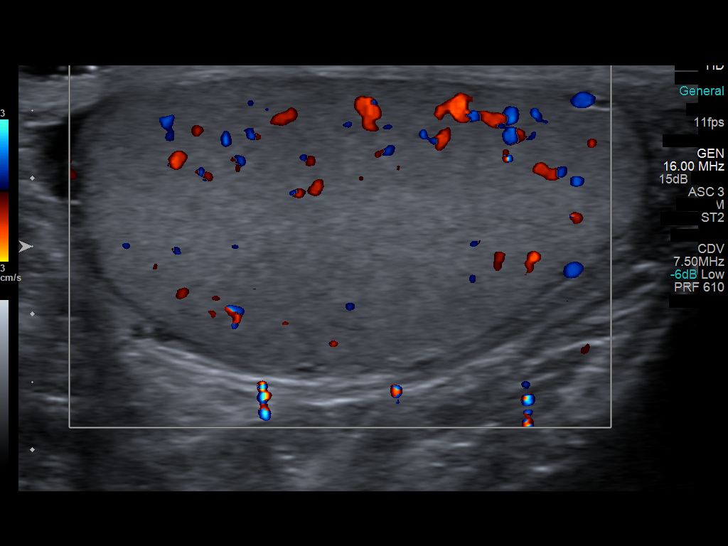
[im 61/67]
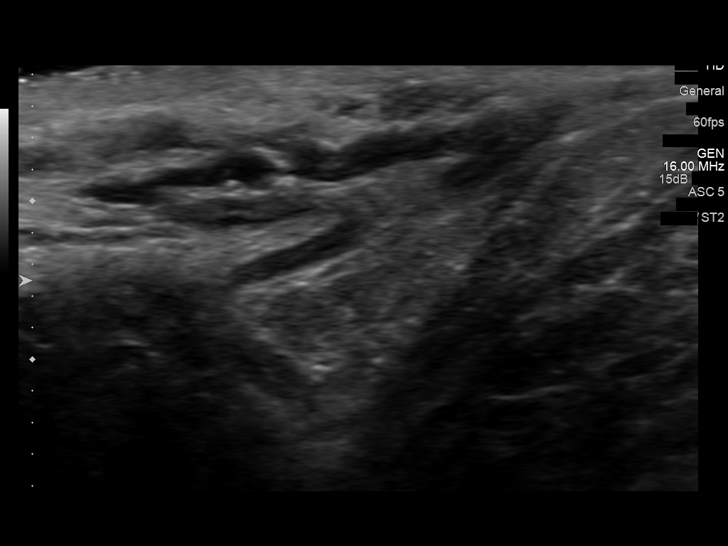
[im 67/67]
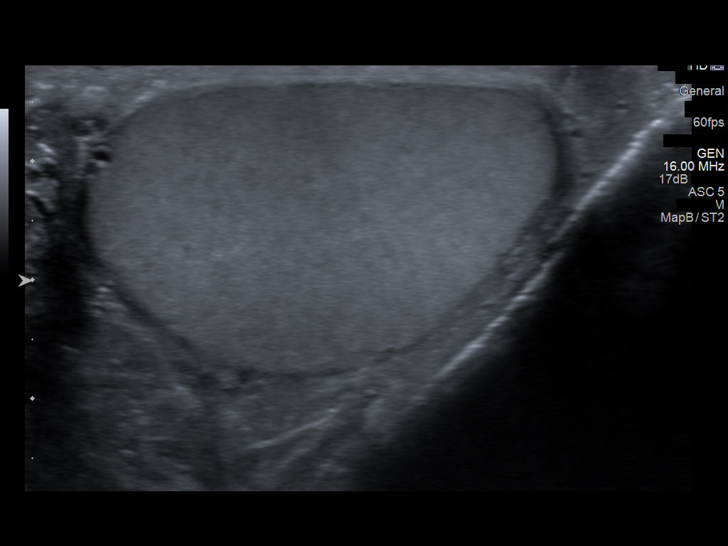

[13 of 25 positions shown; findings below may reference images not displayed]

FINDINGS: Right testicle

Measurements: 4.1 x 2.5 x 3.1 cm.. No mass visualized. There is
microlithiasis.

Left testicle

Measurements: 4.3 x 2.4 x 3.6 cm. No mass or microlithiasis
visualized.

Right epididymis: There is a 3 mm diameter anechoic focus in the
right epididymis. This is most compatible with a cyst. Vascularity
of the epididymal structures is normal.

Left epididymis: Dominant anechoic structure in the left epididymis
measuring 1.7 cm in diameter. A smaller 0.8 cm similar appearing
structure is demonstrated.

Hydrocele: There are hydroceles. There are a few internal echoes on
the left.

Varicocele:  There is a left-sided varicocele.

Pulsed Doppler interrogation of both testes demonstrates normal low
resistance arterial and venous waveforms bilaterally.
IMPRESSION: 1. There are no testicular masses. Vascularity of the testes is
normal.
2. Bilateral epididymal cysts greater on the left than on the right.
There is no evidence of acute epididymitis.
3. Bilateral hydroceles and left-sided varicocele.

## 2017-08-02 ENCOUNTER — Other Ambulatory Visit: Payer: Self-pay | Admitting: Family Medicine

## 2017-08-02 DIAGNOSIS — I10 Essential (primary) hypertension: Secondary | ICD-10-CM

## 2017-10-13 ENCOUNTER — Ambulatory Visit (INDEPENDENT_AMBULATORY_CARE_PROVIDER_SITE_OTHER): Payer: 59 | Admitting: Family Medicine

## 2017-10-13 ENCOUNTER — Encounter: Payer: Self-pay | Admitting: Family Medicine

## 2017-10-13 VITALS — BP 124/80 | HR 66 | Temp 98.0°F | Ht 69.5 in | Wt 222.4 lb

## 2017-10-13 DIAGNOSIS — Z0000 Encounter for general adult medical examination without abnormal findings: Secondary | ICD-10-CM | POA: Diagnosis not present

## 2017-10-13 DIAGNOSIS — L989 Disorder of the skin and subcutaneous tissue, unspecified: Secondary | ICD-10-CM

## 2017-10-13 DIAGNOSIS — Z87898 Personal history of other specified conditions: Secondary | ICD-10-CM

## 2017-10-13 DIAGNOSIS — Z87891 Personal history of nicotine dependence: Secondary | ICD-10-CM

## 2017-10-13 DIAGNOSIS — I4891 Unspecified atrial fibrillation: Secondary | ICD-10-CM

## 2017-10-13 DIAGNOSIS — I10 Essential (primary) hypertension: Secondary | ICD-10-CM | POA: Diagnosis not present

## 2017-10-13 DIAGNOSIS — F1011 Alcohol abuse, in remission: Secondary | ICD-10-CM

## 2017-10-13 LAB — POCT URINALYSIS DIP (PROADVANTAGE DEVICE)
Bilirubin, UA: NEGATIVE
Glucose, UA: NEGATIVE mg/dL
Ketones, POC UA: NEGATIVE mg/dL
Nitrite, UA: NEGATIVE
Protein Ur, POC: NEGATIVE mg/dL
Specific Gravity, Urine: 1.01
Urobilinogen, Ur: 3.5
pH, UA: 6.5 (ref 5.0–8.0)

## 2017-10-13 MED ORDER — RIVAROXABAN 20 MG PO TABS: 20.0000 mg | ORAL_TABLET | Freq: Every day | ORAL | 3 refills | Status: DC

## 2017-10-13 MED ORDER — LISINOPRIL-HYDROCHLOROTHIAZIDE 10-12.5 MG PO TABS: 1.0000 | ORAL_TABLET | Freq: Every day | ORAL | 3 refills | Status: DC

## 2017-10-13 NOTE — Patient Instructions (Signed)
20 minutes of something physical daily 450 minutes a week of something physical

## 2017-10-13 NOTE — Progress Notes (Signed)
   Subjective:    Patient ID: Daniel Romero, male    DOB: 06/08/1960, 57 y.o.   MRN: [id number]  HPI He is here for a complete examination.  He does have lesions present on his face that he would like further evaluated.  Does have hypertension and is now on lisinopril/HCTZ.  He is also taking one aspirin per day.  He has been alcohol free for several years now.  He also no longer smokes.  He has had no chest pain, shortness of breath, irregular heartbeat, PND.  Family and social history as well as health maintenance and immunizations was reviewed   Review of Systems  All other systems reviewed and are negative.      Objective:   Physical Exam BP 124/80 (BP Location: Left Arm, Patient Position: Sitting)   Pulse 66   Temp 98 F (36.7 C)   Ht 5' 9.5" (1.765 m)   Wt 222 lb 6.4 oz (100.9 kg)   SpO2 97%   BMI 32.37 kg/m   General Appearance:    Alert, cooperative, no distress, appears stated age  Head:    Normocephalic, without obvious abnormality, atraumatic  Eyes:    PERRL, conjunctiva/corneas clear, EOM's intact, fundi    benign  Ears:    Normal TM's and external ear canals  Nose:   Nares normal, mucosa normal, no drainage or sinus   tenderness  Throat:   Lips, mucosa, and tongue normal; teeth and gums normal  Neck:   Supple, no lymphadenopathy;  thyroid:  no   enlargement/tenderness/nodules; no carotid   bruit or JVD  Back:    Spine nontender, no curvature, ROM normal, no CVA     tenderness  Lungs:     Clear to auscultation bilaterally without wheezes, rales or     ronchi; respirations unlabored      Heart:    irregular rate and rhythm, S1 and S2 normal, no murmur, rub   or gallop     Abdomen:     Soft, non-tender, nondistended, normoactive bowel sounds,    no masses, no hepatosplenomegaly  Genitalia:   Deferred.  Rectal:   Deferred.  Extremities:   No clubbing, cyanosis or edema  Pulses:   2+ and symmetric all extremities  Skin:   Skin color, texture, turgor normal, flat  slightly pigmented well-demarcated lesions approximately 1/2 to 1 cm in size are noted on the cheeks.  Lymph nodes:   Cervical, supraclavicular, and axillary nodes normal  Neurologic:   CNII-XII intact, normal strength, sensation and gait; reflexes 2+ and symmetric throughout          Psych:   Normal mood, affect, hygiene and grooming.   EKG shows atrial fibrillation Urine microscopic was negative     Assessment & Plan:  Essential hypertension - Plan: CBC with Differential/Platelet, Comprehensive metabolic panel, Ambulatory referral to Cardiology, lisinopril-hydrochlorothiazide (PRINZIDE,ZESTORETIC) 10-12.5 MG tablet  History of alcohol abuse  Former smoker  Facial skin lesion - Plan: Ambulatory referral to Dermatology  Routine general medical examination at a health care facility - Plan: CBC with Differential/Platelet, Comprehensive metabolic panel, Lipid panel, EKG 12-Lead, POCT Urinalysis DIP (Proadvantage Device)  Atrial fibrillation, unspecified type ([place]) I will start him on Xarelto and refer to cardiology.  Difficult to say when exactly he started having difficulty with the atrial fib since he has had no clinical signs or symptoms.  Explained that his medications might be changed around depending upon what the cardiologist finds.

## 2017-10-14 LAB — CBC WITH DIFFERENTIAL/PLATELET
Basophils Absolute: 0 10*3/uL (ref 0.0–0.2)
Basos: 0 %
EOS (ABSOLUTE): 0.2 10*3/uL (ref 0.0–0.4)
Eos: 3 %
Hematocrit: 46.3 % (ref 37.5–51.0)
Hemoglobin: 16 g/dL (ref 13.0–17.7)
IMMATURE GRANS (ABS): 0 10*3/uL (ref 0.0–0.1)
IMMATURE GRANULOCYTES: 1 %
LYMPHS: 22 %
Lymphocytes Absolute: 1.9 10*3/uL (ref 0.7–3.1)
MCH: 30.8 pg (ref 26.6–33.0)
MCHC: 34.6 g/dL (ref 31.5–35.7)
MCV: 89 fL (ref 79–97)
MONOS ABS: 0.4 10*3/uL (ref 0.1–0.9)
Monocytes: 5 %
NEUTROS PCT: 69 %
Neutrophils Absolute: 5.8 10*3/uL (ref 1.4–7.0)
PLATELETS: 305 10*3/uL (ref 150–450)
RBC: 5.19 x10E6/uL (ref 4.14–5.80)
RDW: 13.5 % (ref 12.3–15.4)
WBC: 8.4 10*3/uL (ref 3.4–10.8)

## 2017-10-14 LAB — COMPREHENSIVE METABOLIC PANEL
A/G RATIO: 1.8 (ref 1.2–2.2)
ALT: 51 IU/L — AB (ref 0–44)
AST: 30 IU/L (ref 0–40)
Albumin: 4.6 g/dL (ref 3.5–5.5)
Alkaline Phosphatase: 63 IU/L (ref 39–117)
BILIRUBIN TOTAL: 0.6 mg/dL (ref 0.0–1.2)
BUN/Creatinine Ratio: 11 (ref 9–20)
BUN: 11 mg/dL (ref 6–24)
CALCIUM: 9.3 mg/dL (ref 8.7–10.2)
CHLORIDE: 100 mmol/L (ref 96–106)
CO2: 24 mmol/L (ref 20–29)
Creatinine, Ser: 1.04 mg/dL (ref 0.76–1.27)
GFR, EST AFRICAN AMERICAN: 94 mL/min/{1.73_m2} (ref >59)
GFR, EST NON AFRICAN AMERICAN: 81 mL/min/{1.73_m2} (ref >59)
GLUCOSE: 86 mg/dL (ref 65–99)
Globulin, Total: 2.5 g/dL (ref 1.5–4.5)
POTASSIUM: 4.3 mmol/L (ref 3.5–5.2)
Sodium: 139 mmol/L (ref 134–144)
TOTAL PROTEIN: 7.1 g/dL (ref 6.0–8.5)

## 2017-10-14 LAB — LIPID PANEL
Chol/HDL Ratio: 3.3 ratio (ref 0.0–5.0)
Cholesterol, Total: 193 mg/dL (ref 100–199)
HDL: 58 mg/dL (ref >39)
LDL Calculated: 112 mg/dL — ABNORMAL HIGH (ref 0–99)
TRIGLYCERIDES: 114 mg/dL (ref 0–149)
VLDL CHOLESTEROL CAL: 23 mg/dL (ref 5–40)

## 2017-11-08 ENCOUNTER — Other Ambulatory Visit: Payer: Self-pay | Admitting: *Deleted

## 2017-11-08 ENCOUNTER — Encounter: Payer: Self-pay | Admitting: Cardiovascular Disease

## 2017-11-08 ENCOUNTER — Ambulatory Visit: Payer: 59 | Admitting: Cardiovascular Disease

## 2017-11-08 VITALS — BP 102/74 | HR 106 | Ht 69.5 in | Wt 224.0 lb

## 2017-11-08 DIAGNOSIS — I10 Essential (primary) hypertension: Secondary | ICD-10-CM

## 2017-11-08 DIAGNOSIS — Z8679 Personal history of other diseases of the circulatory system: Secondary | ICD-10-CM | POA: Insufficient documentation

## 2017-11-08 DIAGNOSIS — I4819 Other persistent atrial fibrillation: Secondary | ICD-10-CM

## 2017-11-08 DIAGNOSIS — I481 Persistent atrial fibrillation: Secondary | ICD-10-CM | POA: Diagnosis not present

## 2017-11-08 NOTE — H&P (View-Only) (Signed)
11/08/2017 Daniel Romero   [birth year]  [id number]  Primary Physician Denita Lung, MD Primary Cardiologist: Lorretta Harp MD Lupe Carney, [place]  HPI:  Daniel Romero is a 57 y.o. 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  He was referred by Dr. Redmond School for cardiovascular evaluation because of newly recognized A. fib.  He does have a history of treated hypertension.  He smoked remotely and drank remotely as well stopped 7 years ago.  He is never had a heart attack or stroke.  There is no family history.  He is not aware that he is in A. fib.  He was begun on Xarelto.   Current Meds  Medication Sig  . lisinopril-hydrochlorothiazide (PRINZIDE,ZESTORETIC) 10-12.5 MG tablet Take 1 tablet by mouth daily.  . Multiple Vitamins-Minerals (EYE VITAMINS PO) Take 2 capsules by mouth.  . rivaroxaban (XARELTO) 20 MG TABS tablet Take 1 tablet (20 mg total) by mouth daily with supper.     No Known Allergies  Social History   Socioeconomic History  . Marital status: Single    Spouse name: Not on file  . Number of children: Not on file  . Years of education: Not on file  . Highest education level: Not on file  Occupational History  . Not on file  Social Needs  . Financial resource strain: Not on file  . Food insecurity:    Worry: Not on file    Inability: Not on file  . Transportation needs:    Medical: Not on file    Non-medical: Not on file  Tobacco Use  . Smoking status: Former Smoker    Last attempt to quit: 05/20/2013    Years since quitting: 4.4  . Smokeless tobacco: Never Used  Substance and Sexual Activity  . Alcohol use: No  . Drug use: No  . Sexual activity: Yes  Lifestyle  . Physical activity:    Days per week: Not on file    Minutes per session: Not on file  . Stress: Not on file  Relationships  . Social connections:    Talks on phone: Not on file    Gets together: Not on file    Attends religious service:  Not on file    Active member of club or organization: Not on file    Attends meetings of clubs or organizations: Not on file    Relationship status: Not on file  . Intimate partner violence:    Fear of current or ex partner: Not on file    Emotionally abused: Not on file    Physically abused: Not on file    Forced sexual activity: Not on file  Other Topics Concern  . Not on file  Social History Narrative  . Not on file     Review of Systems: General: negative for chills, fever, night sweats or weight changes.  Cardiovascular: negative for chest pain, dyspnea on exertion, edema, orthopnea, palpitations, paroxysmal nocturnal dyspnea or shortness of breath Dermatological: negative for rash Respiratory: negative for cough or wheezing Urologic: negative for hematuria Abdominal: negative for nausea, vomiting, diarrhea, bright red blood per rectum, melena, or hematemesis Neurologic: negative for visual changes, syncope, or dizziness All other systems reviewed and are otherwise negative except as noted above.    Blood pressure 102/74, pulse (!) 106, height 5' 9.5" (1.765 m), weight 224 lb (101.6 kg).  General appearance: alert and no distress Neck: no adenopathy, no carotid bruit,  no JVD, supple, symmetrical, trachea midline and thyroid not enlarged, symmetric, no tenderness/mass/nodules Lungs: clear to auscultation bilaterally Heart: irregularly irregular rhythm Extremities: extremities normal, atraumatic, no cyanosis or edema Pulses: 2+ and symmetric Skin: Skin color, texture, turgor normal. No rashes or lesions Neurologic: Alert and oriented X 3, normal strength and tone. Normal symmetric reflexes. Normal coordination and gait  EKG atrial fibrillation with a ventricular response of 106.  Personally reviewed this EKG.  ASSESSMENT AND PLAN:   Hypertension History of essential hypertension her blood pressure measured at 102/74.  He is on lisinopril and  hydrochlorothiazide.  Persistent atrial fibrillation [facility]) Mr. Cumba has recently recognized A. fib 3 weeks ago and was begun on Xarelto. The CHA2DSVASC2 score is 1 for hypertension.[name]  He is relatively asymptomatic.  I am going to get a 2D echocardiogram and arrange for him to undergo outpatient cardioversion sometime the next 2 or 3 weeks.  Does not hold sinus rhythm we will settle for controlled ventricular response.      Lorretta Harp MD FACP,FACC,FAHA, [facility] 11/08/2017 2:50 PM

## 2017-11-08 NOTE — Assessment & Plan Note (Signed)
Daniel Romero has recently recognized A. fib 3 weeks ago and was begun on Xarelto. The CHA2DSVASC2 score is 1 for hypertension.[name]  He is relatively asymptomatic.  I am going to get a 2D echocardiogram and arrange for him to undergo outpatient cardioversion sometime the next 2 or 3 weeks.  Does not hold sinus rhythm we will settle for controlled ventricular response.

## 2017-11-08 NOTE — Assessment & Plan Note (Signed)
History of essential hypertension her blood pressure measured at 102/74.  He is on lisinopril and hydrochlorothiazide.

## 2017-11-08 NOTE — Progress Notes (Signed)
11/08/2017 Daniel Romero   Feb 13, 1961  [id number]  Primary Physician Denita Lung, MD Primary Cardiologist: Lorretta Harp MD Daniel Romero, [place]  HPI:  Daniel Romero is a 57 y.o. 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  He was referred by Dr. Redmond School for cardiovascular evaluation because of newly recognized A. fib.  He does have a history of treated hypertension.  He smoked remotely and drank remotely as well stopped 7 years ago.  He is never had a heart attack or stroke.  There is no family history.  He is not aware that he is in A. fib.  He was begun on Xarelto.   Current Meds  Medication Sig  . lisinopril-hydrochlorothiazide (PRINZIDE,ZESTORETIC) 10-12.5 MG tablet Take 1 tablet by mouth daily.  . Multiple Vitamins-Minerals (EYE VITAMINS PO) Take 2 capsules by mouth.  . rivaroxaban (XARELTO) 20 MG TABS tablet Take 1 tablet (20 mg total) by mouth daily with supper.     No Known Allergies  Social History   Socioeconomic History  . Marital status: Single    Spouse name: Not on file  . Number of children: Not on file  . Years of education: Not on file  . Highest education level: Not on file  Occupational History  . Not on file  Social Needs  . Financial resource strain: Not on file  . Food insecurity:    Worry: Not on file    Inability: Not on file  . Transportation needs:    Medical: Not on file    Non-medical: Not on file  Tobacco Use  . Smoking status: Former Smoker    Last attempt to quit: 05/20/2013    Years since quitting: 4.4  . Smokeless tobacco: Never Used  Substance and Sexual Activity  . Alcohol use: No  . Drug use: No  . Sexual activity: Yes  Lifestyle  . Physical activity:    Days per week: Not on file    Minutes per session: Not on file  . Stress: Not on file  Relationships  . Social connections:    Talks on phone: Not on file    Gets together: Not on file    Attends religious service:  Not on file    Active member of club or organization: Not on file    Attends meetings of clubs or organizations: Not on file    Relationship status: Not on file  . Intimate partner violence:    Fear of current or ex partner: Not on file    Emotionally abused: Not on file    Physically abused: Not on file    Forced sexual activity: Not on file  Other Topics Concern  . Not on file  Social History Narrative  . Not on file     Review of Systems: General: negative for chills, fever, night sweats or weight changes.  Cardiovascular: negative for chest pain, dyspnea on exertion, edema, orthopnea, palpitations, paroxysmal nocturnal dyspnea or shortness of breath Dermatological: negative for rash Respiratory: negative for cough or wheezing Urologic: negative for hematuria Abdominal: negative for nausea, vomiting, diarrhea, bright red blood per rectum, melena, or hematemesis Neurologic: negative for visual changes, syncope, or dizziness All other systems reviewed and are otherwise negative except as noted above.    Blood pressure 102/74, pulse (!) 106, height 5' 9.5" (1.765 m), weight 224 lb (101.6 kg).  General appearance: alert and no distress Neck: no adenopathy, no carotid bruit,  no JVD, supple, symmetrical, trachea midline and thyroid not enlarged, symmetric, no tenderness/mass/nodules Lungs: clear to auscultation bilaterally Heart: irregularly irregular rhythm Extremities: extremities normal, atraumatic, no cyanosis or edema Pulses: 2+ and symmetric Skin: Skin color, texture, turgor normal. No rashes or lesions Neurologic: Alert and oriented X 3, normal strength and tone. Normal symmetric reflexes. Normal coordination and gait  EKG atrial fibrillation with a ventricular response of 106.  Personally reviewed this EKG.  ASSESSMENT AND PLAN:   Hypertension History of essential hypertension her blood pressure measured at 102/74.  He is on lisinopril and  hydrochlorothiazide.  Persistent atrial fibrillation [facility]) [name] has recently recognized A. fib 3 weeks ago and was begun on Xarelto. The CHA2DSVASC2 score is 1 for hypertension.[name]  He is relatively asymptomatic.  I am going to get a 2D echocardiogram and arrange for him to undergo outpatient cardioversion sometime the next 2 or 3 weeks.  Does not hold sinus rhythm we will settle for controlled ventricular response.      Lorretta Harp MD FACP,FACC,FAHA, [facility] 11/08/2017 2:50 PM

## 2017-11-08 NOTE — Patient Instructions (Addendum)
Medication Instructions:   NO CHANGE  Testing/Procedures:  Your physician has recommended that you have a Cardioversion (DCCV). Electrical Cardioversion uses a jolt of electricity to your heart either through paddles or wired patches attached to your chest. This is a controlled, usually prescheduled, procedure. Defibrillation is done under light anesthesia in the hospital, and you usually go home the day of the procedure. This is done to get your heart back into a normal rhythm. You are not awake for the procedure. Please see the instruction sheet given to you today.   Your physician has requested that you have an echocardiogram. Echocardiography is a painless test that uses sound waves to create images of your heart. It provides your doctor with information about the size and shape of your heart and how well your heart's chambers and valves are working. This procedure takes approximately one hour. There are no restrictions for this procedure.    Follow-Up:  Your physician recommends that you schedule a follow-up appointment in: 2 [place] are scheduled for a Cardioversion on Friday 12-01-17 with Dr. Aundra Romero.  Please arrive at the [facility] (Main Entrance A) at [facility]: [address] [place], [place] [address] at 8 am (1 hour prior to procedure unless lab work is needed; if lab work is needed arrive 1.5 hours ahead)  DIET: Nothing to eat or drink after midnight except a sip of water with medications (see medication instructions below)  Medication Instructions:  Continue your anticoagulant: Alveda Reasons  You must have a responsible person to drive you home and stay in the waiting area during your procedure. Failure to do so could result in cancellation.  Bring your insurance cards.  *Special Note: Every effort is made to have your procedure done on time. Occasionally there are emergencies that occur at the hospital that may cause delays. Please be patient if a  delay does occur.

## 2017-11-13 NOTE — Addendum Note (Signed)
Addended by: Venetia Maxon on: 11/13/2017 05:26 PM   Modules accepted: Orders

## 2017-11-15 ENCOUNTER — Other Ambulatory Visit: Payer: Self-pay

## 2017-11-15 ENCOUNTER — Ambulatory Visit (HOSPITAL_COMMUNITY): Payer: 59 | Attending: Cardiology

## 2017-11-15 DIAGNOSIS — I4819 Other persistent atrial fibrillation: Secondary | ICD-10-CM

## 2017-11-15 DIAGNOSIS — I119 Hypertensive heart disease without heart failure: Secondary | ICD-10-CM | POA: Diagnosis not present

## 2017-11-15 DIAGNOSIS — I481 Persistent atrial fibrillation: Secondary | ICD-10-CM | POA: Diagnosis not present

## 2017-11-15 MED ORDER — PERFLUTREN LIPID MICROSPHERE
1.0000 mL | INTRAVENOUS | Status: AC | PRN
  Administered 2017-11-15: 2 mL via INTRAVENOUS

## 2017-12-01 ENCOUNTER — Other Ambulatory Visit: Payer: Self-pay

## 2017-12-01 ENCOUNTER — Ambulatory Visit (HOSPITAL_COMMUNITY): Payer: 59 | Admitting: Certified Registered"

## 2017-12-01 ENCOUNTER — Encounter (HOSPITAL_COMMUNITY)
Admission: RE | Disposition: A | Discharge status: Home / Self Care | Payer: Self-pay | Source: Ambulatory Visit | Attending: Cardiology

## 2017-12-01 ENCOUNTER — Ambulatory Visit (HOSPITAL_COMMUNITY)
Admission: RE | Admit: 2017-12-01 | Discharge: 2017-12-01 | Disposition: A | Discharge status: Home / Self Care | Payer: 59 | Source: Ambulatory Visit | Attending: Cardiology | Admitting: Cardiology

## 2017-12-01 ENCOUNTER — Encounter (HOSPITAL_COMMUNITY): Payer: Self-pay | Admitting: *Deleted

## 2017-12-01 DIAGNOSIS — Z87891 Personal history of nicotine dependence: Secondary | ICD-10-CM | POA: Diagnosis not present

## 2017-12-01 DIAGNOSIS — I4819 Other persistent atrial fibrillation: Secondary | ICD-10-CM

## 2017-12-01 DIAGNOSIS — I4891 Unspecified atrial fibrillation: Secondary | ICD-10-CM

## 2017-12-01 DIAGNOSIS — Z7901 Long term (current) use of anticoagulants: Secondary | ICD-10-CM | POA: Insufficient documentation

## 2017-12-01 DIAGNOSIS — I10 Essential (primary) hypertension: Secondary | ICD-10-CM | POA: Insufficient documentation

## 2017-12-01 DIAGNOSIS — I481 Persistent atrial fibrillation: Secondary | ICD-10-CM | POA: Diagnosis not present

## 2017-12-01 HISTORY — PX: CARDIOVERSION: SHX1299

## 2017-12-01 LAB — POCT I-STAT 4, (NA,K, GLUC, HGB,HCT)
Glucose, Bld: 100 mg/dL — ABNORMAL HIGH (ref 70–99)
HCT: 43 % (ref 39.0–52.0)
Hemoglobin: 14.6 g/dL (ref 13.0–17.0)
Potassium: 3.6 mmol/L (ref 3.5–5.1)
Sodium: 140 mmol/L (ref 135–145)

## 2017-12-01 SURGERY — CARDIOVERSION
Anesthesia: General

## 2017-12-01 MED ORDER — SODIUM CHLORIDE 0.9 % IV SOLN: 250.0000 mL | INTRAVENOUS | Status: DC

## 2017-12-01 MED ORDER — SODIUM CHLORIDE 0.9% FLUSH: 3.0000 mL | INTRAVENOUS | Status: DC | PRN

## 2017-12-01 MED ORDER — LIDOCAINE 2% (20 MG/ML) 5 ML SYRINGE
INTRAMUSCULAR | Status: DC | PRN
  Administered 2017-12-01: 60 mg via INTRAVENOUS

## 2017-12-01 MED ORDER — SODIUM CHLORIDE 0.9% FLUSH: 3.0000 mL | Freq: Two times a day (BID) | INTRAVENOUS | Status: DC

## 2017-12-01 MED ORDER — PROPOFOL 10 MG/ML IV BOLUS
INTRAVENOUS | Status: DC | PRN
  Administered 2017-12-01: 90 mg via INTRAVENOUS

## 2017-12-01 MED ORDER — SODIUM CHLORIDE 0.9 % IV SOLN
INTRAVENOUS | Status: DC
  Administered 2017-12-01: 08:00:00 via INTRAVENOUS

## 2017-12-01 NOTE — Procedures (Signed)
Electrical Cardioversion Procedure Note Daniel Romero [id number] 1960-09-03  Procedure: Electrical Cardioversion Indications:  Atrial Fibrillation  Procedure Details Consent: Risks of procedure as well as the alternatives and risks of each were explained to the (patient/caregiver).  Consent for procedure obtained. Time Out: Verified patient identification, verified procedure, site/side was marked, verified correct patient position, special equipment/implants available, medications/allergies/relevent history reviewed, required imaging and test results available.  Performed  Patient placed on cardiac monitor, pulse oximetry, supplemental oxygen as necessary.  Sedation given: Propofol per anesthesiology Pacer pads placed anterior and posterior chest.  Cardioverted 1 time(s).  Cardioverted at [place].  Evaluation Findings: Post procedure EKG shows: NSR Complications: None Patient did tolerate procedure well.   Loralie Champagne 12/01/2017, 9:29 AM

## 2017-12-01 NOTE — Anesthesia Postprocedure Evaluation (Signed)
Anesthesia Post Note  Patient: Daniel Romero  Procedure(s) Performed: CARDIOVERSION (N/A )     Patient location during evaluation: Endoscopy Anesthesia Type: General Level of consciousness: awake Pain management: pain level controlled Vital Signs Assessment: post-procedure vital signs reviewed and stable Respiratory status: spontaneous breathing Cardiovascular status: stable Postop Assessment: no apparent nausea or vomiting Anesthetic complications: no    Last Vitals:  Vitals:   12/01/17 0930 12/01/17 0940  BP: 133/87 128/82  Pulse: 82 63  Resp: (!) 23 (!) 22  Temp: 36.7 C   SpO2: 98% 98%    Last Pain:  Vitals:   12/01/17 0940  TempSrc:   PainSc: 0-No pain                 Ruthell Feigenbaum

## 2017-12-01 NOTE — Anesthesia Procedure Notes (Addendum)
Procedure Name: MAC Date/Time: 12/01/2017 9:22 AM Performed by: Marsa Aris, CRNA Pre-anesthesia Checklist: Patient identified, Emergency Drugs available, Suction available, Patient being monitored and Timeout performed Patient Re-evaluated:Patient Re-evaluated prior to induction Oxygen Delivery Method: Ambu bag Preoxygenation: Pre-oxygenation with 100% oxygen Induction Type: IV induction Dental Injury: Teeth and Oropharynx as per pre-operative assessment

## 2017-12-01 NOTE — Interval H&P Note (Signed)
History and Physical Interval Note:  12/01/2017 9:22 AM  Daniel Romero  has presented today for surgery, with the diagnosis of A-FIB  The various methods of treatment have been discussed with the patient and family. After consideration of risks, benefits and other options for treatment, the patient has consented to  Procedure(s): CARDIOVERSION (N/A) as a surgical intervention .  The patient's history has been reviewed, patient examined, no change in status, stable for surgery.  I have reviewed the patient's chart and labs.  Questions were answered to the patient's satisfaction.     Shanette Tamargo [employer]

## 2017-12-01 NOTE — Anesthesia Preprocedure Evaluation (Addendum)
Anesthesia Evaluation  Patient identified by MRN, date of birth, ID band Patient awake    Reviewed: Allergy & Precautions, NPO status , Patient's Chart, lab work & pertinent test results  Airway Mallampati: II  TM Distance: >3 FB     Dental   Pulmonary former smoker,    breath sounds clear to auscultation       Cardiovascular hypertension,  Rhythm:Regular Rate:Normal     Neuro/Psych    GI/Hepatic Neg liver ROS, GI history noted CG   Endo/Other  negative endocrine ROS  Renal/GU negative Renal ROS     Musculoskeletal   Abdominal   Peds  Hematology negative hematology ROS (+)   Anesthesia Other Findings   Reproductive/Obstetrics                           Anesthesia Physical Anesthesia Plan  ASA: III  Anesthesia Plan: General   Post-op Pain Management:    Induction: Intravenous  PONV Risk Score and Plan: Treatment may vary due to age or medical condition  Airway Management Planned: Mask, Simple Face Mask and Nasal Cannula  Additional Equipment:   Intra-op Plan:   Post-operative Plan:   Informed Consent: I have reviewed the patients History and Physical, chart, labs and discussed the procedure including the risks, benefits and alternatives for the proposed anesthesia with the patient or authorized representative who has indicated his/her understanding and acceptance.   Dental advisory given  Plan Discussed with: CRNA and Anesthesiologist  Anesthesia Plan Comments:         Anesthesia Quick Evaluation

## 2017-12-01 NOTE — Discharge Instructions (Signed)
Electrical Cardioversion, Care After °This sheet gives you information about how to care for yourself after your procedure. Your health care provider may also give you more specific instructions. If you have problems or questions, contact your health care provider. °What can I expect after the procedure? °After the procedure, it is common to have: °· Some redness on the skin where the shocks were given. ° °Follow these instructions at home: °· Do not drive for 24 hours if you were given a medicine to help you relax (sedative). °· Take over-the-counter and prescription medicines only as told by your health care provider. °· Ask your health care provider how to check your pulse. Check it often. °· Rest for 48 hours after the procedure or as told by your health care provider. °· Avoid or limit your caffeine use as told by your health care provider. °Contact a health care provider if: °· You feel like your heart is beating too quickly or your pulse is not regular. °· You have a serious muscle cramp that does not go away. °Get help right away if: °· You have discomfort in your chest. °· You are dizzy or you feel faint. °· You have trouble breathing or you are short of breath. °· Your speech is slurred. °· You have trouble moving an arm or leg on one side of your body. °· Your fingers or toes turn cold or blue. °This information is not intended to replace advice given to you by your health care provider. Make sure you discuss any questions you have with your health care provider. °Document Released: 12/26/2012 Document Revised: 10/09/2015 Document Reviewed: 09/11/2015 °Elsevier Interactive Patient Education © 2018 [employer] ° °

## 2017-12-01 NOTE — Transfer of Care (Signed)
Immediate Anesthesia Transfer of Care Note  Patient: Daniel Romero  Procedure(s) Performed: CARDIOVERSION (N/A )  Patient Location: Endoscopy Unit  Anesthesia Type:MAC  Level of Consciousness: awake, alert  and oriented  Airway & Oxygen Therapy: Patient Spontanous Breathing  Post-op Assessment: Report given to RN and Post -op Vital signs reviewed and stable  Post vital signs: Reviewed and stable  Last Vitals:  Vitals Value Taken Time  BP 130/96 12/01/2017  9:28 AM  Temp    Pulse 78 12/01/2017  9:29 AM  Resp 17 12/01/2017  9:29 AM  SpO2 95 % 12/01/2017  9:29 AM    Last Pain:  Vitals:   12/01/17 0805  TempSrc: Oral  PainSc: 0-No pain         Complications: No apparent anesthesia complications

## 2017-12-03 ENCOUNTER — Encounter (HOSPITAL_COMMUNITY): Payer: Self-pay | Admitting: Cardiology

## 2018-01-10 ENCOUNTER — Ambulatory Visit: Payer: 59 | Admitting: Cardiovascular Disease

## 2018-01-10 ENCOUNTER — Encounter: Payer: Self-pay | Admitting: Cardiovascular Disease

## 2018-01-10 DIAGNOSIS — I4819 Other persistent atrial fibrillation: Secondary | ICD-10-CM | POA: Diagnosis not present

## 2018-01-10 DIAGNOSIS — I519 Heart disease, unspecified: Secondary | ICD-10-CM

## 2018-01-10 DIAGNOSIS — I10 Essential (primary) hypertension: Secondary | ICD-10-CM

## 2018-01-10 NOTE — Addendum Note (Signed)
Addended by: Newt Minion on: 01/10/2018 04:22 PM   Modules accepted: Orders

## 2018-01-10 NOTE — Assessment & Plan Note (Signed)
History of left ventricle dysfunction by 2D echo performed 11/07/2017 EF of 40 to 45% mildly dilated left atrium.  I suspect his LV dysfunction is related to the A. fib.

## 2018-01-10 NOTE — Assessment & Plan Note (Signed)
History of recently recognized A. fib status post successful DC cardioversion by Dr. Algernon Huxley 12/01/2017 to sinus rhythm.  Unfortunately, I think he is back in A. fib today.  He is on Xarelto oral anticoagulation.  I am going to refer him to the A. fib clinic for further evaluation.  He really is asymptomatic however.

## 2018-01-10 NOTE — Assessment & Plan Note (Signed)
History of essential hypertension her blood pressure measured at 114/70.  He is on lisinopril and hydrochlorothiazide.

## 2018-01-10 NOTE — Progress Notes (Signed)
01/10/2018 Daniel Romero   07-30-1960  [id number]  Primary Physician Denita Lung, MD Primary Cardiologist: Lorretta Harp MD Lupe Carney, [place]  HPI:  Daniel Romero is a 57 y.o.  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  He was referred by Dr. Redmond School for cardiovascular evaluation because of newly recognized A. fib.    I last saw him in the office 11/08/2017.  He does have a history of treated hypertension.  He smoked remotely and drank remotely as well stopped 7 years ago.  He is never had a heart attack or stroke.  There is no family history.  He is not aware that he is in A. fib.  He was begun on Xarelto.  I arrange for him to undergo outpatient cardioversion performed by Dr. Aundra Dubin successfully 12/01/2017.  He really cannot tell a difference although I suspect he is back in A. fib today.  He did have a 2D echocardiogram performed 11/07/2017 revealing an EF of 40 to 45% with mild left atrial dilatation.   Current Meds  Medication Sig  . lisinopril-hydrochlorothiazide (PRINZIDE,ZESTORETIC) 10-12.5 MG tablet Take 1 tablet by mouth daily.  . Multiple Vitamins-Minerals (EYE VITAMINS PO) Take 2 capsules by mouth daily.   . rivaroxaban (XARELTO) 20 MG TABS tablet Take 1 tablet (20 mg total) by mouth daily with supper.     No Known Allergies  Social History   Socioeconomic History  . Marital status: Single    Spouse name: Not on file  . Number of children: Not on file  . Years of education: Not on file  . Highest education level: Not on file  Occupational History  . Not on file  Social Needs  . Financial resource strain: Not on file  . Food insecurity:    Worry: Not on file    Inability: Not on file  . Transportation needs:    Medical: Not on file    Non-medical: Not on file  Tobacco Use  . Smoking status: Former Smoker    Last attempt to quit: 05/20/2013    Years since quitting: 4.6  . Smokeless tobacco: Never  Used  Substance and Sexual Activity  . Alcohol use: No  . Drug use: No  . Sexual activity: Yes  Lifestyle  . Physical activity:    Days per week: Not on file    Minutes per session: Not on file  . Stress: Not on file  Relationships  . Social connections:    Talks on phone: Not on file    Gets together: Not on file    Attends religious service: Not on file    Active member of club or organization: Not on file    Attends meetings of clubs or organizations: Not on file    Relationship status: Not on file  . Intimate partner violence:    Fear of current or ex partner: Not on file    Emotionally abused: Not on file    Physically abused: Not on file    Forced sexual activity: Not on file  Other Topics Concern  . Not on file  Social History Narrative  . Not on file     Review of Systems: General: negative for chills, fever, night sweats or weight changes.  Cardiovascular: negative for chest pain, dyspnea on exertion, edema, orthopnea, palpitations, paroxysmal nocturnal dyspnea or shortness of breath Dermatological: negative for rash Respiratory: negative for cough or wheezing Urologic: negative for  hematuria Abdominal: negative for nausea, vomiting, diarrhea, bright red blood per rectum, melena, or hematemesis Neurologic: negative for visual changes, syncope, or dizziness All other systems reviewed and are otherwise negative except as noted above.    Blood pressure 114/70, pulse 68, height 5\' 10"  (1.778 m), weight 223 lb (101.2 kg).  General appearance: alert and no distress Neck: no adenopathy, no carotid bruit, no JVD, supple, symmetrical, trachea midline and thyroid not enlarged, symmetric, no tenderness/mass/nodules Lungs: clear to auscultation bilaterally Heart: irregularly irregular rhythm Extremities: extremities normal, atraumatic, no cyanosis or edema Pulses: 2+ and symmetric Skin: Skin color, texture, turgor normal. No rashes or lesions Neurologic: Alert and  oriented X 3, normal strength and tone. Normal symmetric reflexes. Normal coordination and gait  EKG atrial fibrillation ventricular response of 96.  I personally reviewed this EKG.  ASSESSMENT AND PLAN:   Hypertension History of essential hypertension her blood pressure measured at 114/70.  He is on lisinopril and hydrochlorothiazide.  Persistent atrial fibrillation ([place]) History of recently recognized A. fib status post successful DC cardioversion by Dr. Algernon Huxley 12/01/2017 to sinus rhythm.  Unfortunately, I think he is back in A. fib today.  He is on Xarelto oral anticoagulation.  I am going to refer him to the A. fib clinic for further evaluation.  He really is asymptomatic however.  Left ventricular dysfunction History of left ventricle dysfunction by 2D echo performed 11/07/2017 EF of 40 to 45% mildly dilated left atrium.  I suspect his LV dysfunction is related to the A. fib.      Lorretta Harp MD FACP,FACC,FAHA, [facility] 01/10/2018 4:13 PM

## 2018-01-10 NOTE — Patient Instructions (Addendum)
Medication Instructions:  Your physician recommends that you continue on your current medications as directed. Please refer to the Current Medication list given to you today.  If you need a refill on your cardiac medications before your next appointment, please call your pharmacy.   Lab work: none If you have labs (blood work) drawn today and your tests are completely normal, you will receive your results only by: [name] MyChart Message (if you have MyChart) OR . A paper copy in the mail If you have any lab test that is abnormal or we need to change your treatment, we will call you to review the results.  Testing/Procedures: none  Follow-Up: At [facility], you and your health needs are our priority.  As part of our continuing mission to provide you with exceptional heart care, we have created designated Provider Care Teams.  These Care Teams include your primary Cardiologist (physician) and Advanced Practice Providers (APPs -  Physician Assistants and Nurse Practitioners) who all work together to provide you with the care you need, when you need it. We request that you follow-up in: 6 months with an extender and in 12 months with Dr Gwenlyn Found.  You will receive a reminder letter in the mail two months in advance. If you don't receive a letter, please call our office to schedule the follow-up appointment.    Please call our office 2 months in advance to schedule this appointment.  You may see or one of the following Advanced Practice Providers on your designated Care Team:   Kerin Ransom, PA-C Roby Lofts, [place] . Sande Rives, PA-C  You have been referred to A. Fib Clinic with Ceasar Lund.  Any Other Special Instructions Will Be Listed Below (If Applicable).

## 2018-01-15 ENCOUNTER — Ambulatory Visit: Payer: Self-pay | Admitting: Psychiatry

## 2018-01-15 NOTE — Addendum Note (Signed)
Addended by: Cristopher Estimable on: 01/15/2018 07:33 AM   Modules accepted: Orders

## 2018-01-22 ENCOUNTER — Ambulatory Visit (HOSPITAL_COMMUNITY)
Admission: RE | Admit: 2018-01-22 | Discharge: 2018-01-22 | Disposition: A | Discharge status: Home / Self Care | Payer: 59 | Source: Ambulatory Visit | Attending: Nurse Practitioner | Admitting: Nurse Practitioner

## 2018-01-22 ENCOUNTER — Encounter (HOSPITAL_COMMUNITY): Payer: Self-pay | Admitting: Nurse Practitioner

## 2018-01-22 VITALS — BP 112/74 | HR 104 | Ht 70.0 in | Wt 227.0 lb

## 2018-01-22 DIAGNOSIS — E669 Obesity, unspecified: Secondary | ICD-10-CM | POA: Insufficient documentation

## 2018-01-22 DIAGNOSIS — F329 Major depressive disorder, single episode, unspecified: Secondary | ICD-10-CM | POA: Insufficient documentation

## 2018-01-22 DIAGNOSIS — Z87891 Personal history of nicotine dependence: Secondary | ICD-10-CM | POA: Insufficient documentation

## 2018-01-22 DIAGNOSIS — I10 Essential (primary) hypertension: Secondary | ICD-10-CM | POA: Insufficient documentation

## 2018-01-22 DIAGNOSIS — I4819 Other persistent atrial fibrillation: Secondary | ICD-10-CM | POA: Insufficient documentation

## 2018-01-22 DIAGNOSIS — Z7901 Long term (current) use of anticoagulants: Secondary | ICD-10-CM | POA: Insufficient documentation

## 2018-01-22 DIAGNOSIS — Z6832 Body mass index (BMI) 32.0-32.9, adult: Secondary | ICD-10-CM | POA: Diagnosis not present

## 2018-01-22 NOTE — Progress Notes (Signed)
Primary Care Physician: Denita Lung, MD Referring Physician: Dr. Inez Catalina is a 57 y.o. male with a h/o HTN, asymptomatic, newly diagnosed afib in August of this year being referred to Dr. Gwenlyn Found at that time. He was cardioverted,12/01/17, after sufficient time of being anticoagulanted. This was successful but he was back in afib on f/u with Dr. Gwenlyn Found.Echo showed an EF of 40-45%. It was discussed with pt even thought he is asymptomatic his reduced EF may be 2/2 to  [facility] and it would be beneficial to try to restore SR going forward.  He currently does not drink or smoke. Mod caffeine use. Denies a snoring history.   Today, he denies symptoms of palpitations, chest pain, shortness of breath, orthopnea, PND, lower extremity edema, dizziness, presyncope, syncope, or neurologic sequela. The patient is tolerating medications without difficulties and is otherwise without complaint today.   Past Medical History:  Diagnosis Date  . Allergy    seasonal  . Depression   . Hypertension   . Obesity    Past Surgical History:  Procedure Laterality Date  . CARDIOVERSION N/A 12/01/2017   Procedure: CARDIOVERSION;  Surgeon: Larey Dresser, MD;  Location: [facility] ENDOSCOPY;  Service: Cardiovascular;  Laterality: N/A;  . NO PAST SURGERIES    . WISDOM TOOTH EXTRACTION      Current Outpatient Medications  Medication Sig Dispense Refill  . lisinopril-hydrochlorothiazide (PRINZIDE,ZESTORETIC) 10-12.5 MG tablet Take 1 tablet by mouth daily. 90 tablet 3  . Multiple Vitamins-Minerals (EYE VITAMINS PO) Take 2 capsules by mouth daily.     . rivaroxaban (XARELTO) 20 MG TABS tablet Take 1 tablet (20 mg total) by mouth daily with supper. 90 tablet 3   No current facility-administered medications for this encounter.     No Known Allergies  Social History   Socioeconomic History  . Marital status: Single    Spouse name: Not on file  . Number of children: Not on file  . Years of education: Not on  file  . Highest education level: Not on file  Occupational History  . Not on file  Social Needs  . Financial resource strain: Not on file  . Food insecurity:    Worry: Not on file    Inability: Not on file  . Transportation needs:    Medical: Not on file    Non-medical: Not on file  Tobacco Use  . Smoking status: Former Smoker    Last attempt to quit: 05/20/2013    Years since quitting: 4.6  . Smokeless tobacco: Never Used  Substance and Sexual Activity  . Alcohol use: No  . Drug use: No  . Sexual activity: Yes  Lifestyle  . Physical activity:    Days per week: Not on file    Minutes per session: Not on file  . Stress: Not on file  Relationships  . Social connections:    Talks on phone: Not on file    Gets together: Not on file    Attends religious service: Not on file    Active member of club or organization: Not on file    Attends meetings of clubs or organizations: Not on file    Relationship status: Not on file  . Intimate partner violence:    Fear of current or ex partner: Not on file    Emotionally abused: Not on file    Physically abused: Not on file    Forced sexual activity: Not on file  Other Topics Concern  .  Not on file  Social History Narrative  . Not on file    Family History  Problem Relation Age of Onset  . Stroke Father   . Heart disease Father   . Colon cancer Neg Hx   . Esophageal cancer Neg Hx   . Stomach cancer Neg Hx   . Rectal cancer Neg Hx     ROS- All systems are reviewed and negative except as per the HPI above  Physical Exam: Vitals:   01/22/18 1454  BP: 112/74  Pulse: (!) 104  Weight: 103 kg  Height: 5\' 10"  (1.778 m)   Wt Readings from Last 3 Encounters:  01/22/18 103 kg  01/10/18 101.2 kg  12/01/17 99.8 kg    Labs: Lab Results  Component Value Date   NA 140 12/01/2017   K 3.6 12/01/2017   CL 100 10/13/2017   CO2 24 10/13/2017   GLUCOSE 100 (H) 12/01/2017   BUN 11 10/13/2017   CREATININE 1.04 10/13/2017    CALCIUM 9.3 10/13/2017   No results found for: INR Lab Results  Component Value Date   CHOL 193 10/13/2017   HDL 58 10/13/2017   LDLCALC 112 (H) 10/13/2017   TRIG 114 10/13/2017     GEN- The patient is well appearing, alert and oriented x 3 today.   Head- normocephalic, atraumatic Eyes-  Sclera clear, conjunctiva pink Ears- hearing intact Oropharynx- clear Neck- supple, no JVP Lymph- no cervical lymphadenopathy Lungs- Clear to ausculation bilaterally, normal work of breathing Heart- Regular rate and rhythm, no murmurs, rubs or gallops, PMI not laterally displaced GI- soft, NT, ND, + BS Extremities- no clubbing, cyanosis, or edema MS- no significant deformity or atrophy Skin- no rash or lesion Psych- euthymic mood, full affect Neuro- strength and sensation are intact  EKG- afib at 104 bpm, qrs int 88 ms, qtc 428 ms Echo-Study Conclusions  - Left ventricle: The cavity size was normal. Wall thickness was   increased in a pattern of mild LVH. Systolic function was mildly   to moderately reduced. The estimated ejection fraction was in the   range of 40% to 45%. Diffuse hypokinesis. There was no evidence   of elevated ventricular filling pressure by Doppler parameters. - Left atrium: The atrium was mildly dilated. Volume/bsa, ES,   (1-plane Simpson&'s, A2C): 35.3 ml/m^2. Left atrium 41 mm  Assessment and Plan: 1. Persistent  afib Seems to be present since this past summer Had successful cardioversion but ERAF He is rate controlled and is not on BB He is on xarelto20 mg for a chadsvasc score of 2(htyn,lv dysfunction) Options to restore SR would be Tikosyn or sotalol He is not symptomatic but would be best to restore SR for his LV dysfunction which may worsen over time He is on young side for amiodarone and multaq/flecainde are not options 2/2 for LV dysfunction He is not an ablation candidate at this time for lack of trying an antiarrythmic  HCTZ will have to be stopped if  sotalol or Tikosyn is used 3 days prior to hospitalization   These options were discussed in detail and pt will think about his options and call back if he wants to get scheduled for admission   Butch Penny C. Tranice Laduke, [facility] [address] [place], [place] [address] [phone]

## 2018-01-29 ENCOUNTER — Telehealth: Payer: Self-pay | Admitting: Pharmacist

## 2018-01-29 NOTE — Telephone Encounter (Signed)
Medication list reviewed in anticipation of upcoming Tikosyn initiation. Patient is taking HCTZ which is contraindicated with Tikosyn - will need to be discontinued 3 days prior to admission.  Patient is anticoagulated on Xarelto 20mg  daily on the appropriate dose. Please ensure that patient has not missed any anticoagulation doses in the 3 weeks prior to Tikosyn initiation.   K 3.6 on labs scanned in 9/17 - this will likely need to be repleted prior to [place] admission.  Patient will need to be counseled to avoid use of Benadryl while on Tikosyn and in the 2-3 days prior to Tikosyn initiation.

## 2018-01-29 NOTE — Telephone Encounter (Signed)
Patient notified to stop HCTZ 3 days prior to admission. Pt verbalized understanding.

## 2018-02-27 ENCOUNTER — Ambulatory Visit (HOSPITAL_COMMUNITY)
Admission: RE | Admit: 2018-02-27 | Discharge: 2018-02-27 | Disposition: A | Discharge status: Home / Self Care | Payer: 59 | Source: Ambulatory Visit | Attending: Nurse Practitioner | Admitting: Nurse Practitioner

## 2018-02-27 ENCOUNTER — Other Ambulatory Visit: Payer: Self-pay

## 2018-02-27 ENCOUNTER — Encounter (HOSPITAL_COMMUNITY): Payer: Self-pay | Admitting: Nurse Practitioner

## 2018-02-27 ENCOUNTER — Encounter (HOSPITAL_COMMUNITY): Payer: Self-pay | Admitting: Internal Medicine

## 2018-02-27 ENCOUNTER — Inpatient Hospital Stay (HOSPITAL_COMMUNITY)
Admission: RE | Admit: 2018-02-27 | Discharge: 2018-03-02 | DRG: 310 | Disposition: A | Discharge status: Home / Self Care | Payer: 59 | Source: Ambulatory Visit | Attending: Internal Medicine | Admitting: Internal Medicine

## 2018-02-27 VITALS — BP 142/86 | HR 104 | Ht 70.0 in | Wt 229.0 lb

## 2018-02-27 DIAGNOSIS — I10 Essential (primary) hypertension: Secondary | ICD-10-CM | POA: Diagnosis not present

## 2018-02-27 DIAGNOSIS — Z79899 Other long term (current) drug therapy: Secondary | ICD-10-CM

## 2018-02-27 DIAGNOSIS — I4819 Other persistent atrial fibrillation: Secondary | ICD-10-CM

## 2018-02-27 DIAGNOSIS — Z87891 Personal history of nicotine dependence: Secondary | ICD-10-CM | POA: Diagnosis not present

## 2018-02-27 DIAGNOSIS — Z8679 Personal history of other diseases of the circulatory system: Secondary | ICD-10-CM | POA: Diagnosis present

## 2018-02-27 DIAGNOSIS — I429 Cardiomyopathy, unspecified: Secondary | ICD-10-CM | POA: Diagnosis present

## 2018-02-27 HISTORY — DX: Other persistent atrial fibrillation: I48.19

## 2018-02-27 HISTORY — DX: Other seasonal allergic rhinitis: J30.2

## 2018-02-27 HISTORY — DX: Heart disease, unspecified: I51.9

## 2018-02-27 LAB — MAGNESIUM: Magnesium: 2 mg/dL (ref 1.7–2.4)

## 2018-02-27 LAB — BASIC METABOLIC PANEL
Anion gap: 8 (ref 5–15)
BUN: 13 mg/dL (ref 6–20)
CO2: 23 mmol/L (ref 22–32)
Calcium: 8.9 mg/dL (ref 8.9–10.3)
Chloride: 109 mmol/L (ref 98–111)
Creatinine, Ser: 0.95 mg/dL (ref 0.61–1.24)
GFR calc Af Amer: >60 mL/min (ref >60)
Glucose, Bld: 101 mg/dL — ABNORMAL HIGH (ref 70–99)
Potassium: 4.1 mmol/L (ref 3.5–5.1)
Sodium: 140 mmol/L (ref 135–145)

## 2018-02-27 MED ORDER — SODIUM CHLORIDE 0.9 % IV SOLN
250.0000 mL | INTRAVENOUS | Status: DC | PRN
  Administered 2018-02-27: 250 mL via INTRAVENOUS
  Administered 2018-03-01: 13:00:00 via INTRAVENOUS

## 2018-02-27 MED ORDER — SODIUM CHLORIDE 0.9% FLUSH: 3.0000 mL | INTRAVENOUS | Status: DC | PRN

## 2018-02-27 MED ORDER — MAGNESIUM SULFATE 2 GM/50ML IV SOLN
2.0000 g | Freq: Once | INTRAVENOUS | Status: AC
  Administered 2018-02-27: 2 g via INTRAVENOUS
  Filled 2018-02-27: qty 50

## 2018-02-27 MED ORDER — SODIUM CHLORIDE 0.9% FLUSH
3.0000 mL | Freq: Two times a day (BID) | INTRAVENOUS | Status: DC
  Administered 2018-02-28 (×2): 3 mL via INTRAVENOUS

## 2018-02-27 MED ORDER — DOFETILIDE 500 MCG PO CAPS
500.0000 ug | ORAL_CAPSULE | Freq: Two times a day (BID) | ORAL | Status: DC
  Administered 2018-02-27 – 2018-03-02 (×6): 500 ug via ORAL
  Filled 2018-02-27 (×6): qty 1

## 2018-02-27 MED ORDER — INFLUENZA VAC SPLIT QUAD 0.5 ML IM SUSY: 0.5000 mL | PREFILLED_SYRINGE | INTRAMUSCULAR | Status: DC

## 2018-02-27 MED ORDER — RIVAROXABAN 20 MG PO TABS
20.0000 mg | ORAL_TABLET | Freq: Every day | ORAL | Status: DC
  Administered 2018-02-28 – 2018-03-02 (×3): 20 mg via ORAL
  Filled 2018-02-27 (×3): qty 1

## 2018-02-27 NOTE — Progress Notes (Addendum)
Pharmacy Consult for [place] Electrolyte Replacement  Pharmacy consulted to assist in monitoring and replacing electrolytes in this 57 y.o. male admitted on 02/27/2018 undergoing dofetilide initiation. First dofetilide dose: 500mg   Labs:    Component Value Date/Time   K 4.1 02/27/2018 1038   MG 2.0 02/27/2018 1038     Plan:  Potassium: K >/= 4: Appropriate to initiate Tikosyn    Magnesium: Mg 1.8-2: Mg 2 gm IV x1 to prevent Mg from dropping below 1.8 - do not need to recheck Mg    Thank you for allowing pharmacy to participate in this patient's care   Hildred Laser, PharmD Clinical Pharmacist **Pharmacist phone directory can now be found on [email or web address] ([email or web address]).  Listed under [place].

## 2018-02-27 NOTE — Progress Notes (Signed)
Primary Care Physician: Denita Lung, MD Referring Physician: Dr. Inez Romero is a 57 y.o. male with a h/o HTN, asymptomatic, newly diagnosed afib in August of this year being referred to Dr. Gwenlyn Found at that time. He was cardioverted,12/01/17, after sufficient time of being anticoagulanted. This was successful but he was back in afib on f/u with Dr. Gwenlyn Found.Echo showed an EF of 40-45%. It was discussed with pt even thought he is asymptomatic his reduced EF may be 2/2 to  [facility] and it would be beneficial to try to restore SR going forward. He currently does not drink or smoke. Mod caffeine use. Denies a snoring history.   F/u in afib clinic, 12/10. He is here for Tikosyn admit. He stopped lisinopril/hctz last Thursday. He is aware of cost of drug and can afford.   Today, he denies symptoms of palpitations, chest pain, shortness of breath, orthopnea, PND, lower extremity edema, dizziness, presyncope, syncope, or neurologic sequela. The patient is tolerating medications without difficulties and is otherwise without complaint today.   Past Medical History:  Diagnosis Date  . Allergy    seasonal  . Depression   . Hypertension   . Obesity    Past Surgical History:  Procedure Laterality Date  . CARDIOVERSION N/A 12/01/2017   Procedure: CARDIOVERSION;  Surgeon: Larey Dresser, MD;  Location[facility] END[facility]COPY;  Service: Cardiovascular;  Laterality: N/A;  . NO PAST SURGERIES    . WISDOM TOOTH EXTRACTION      Current Outpatient Medications  Medication Sig Dispense Refill  . lisinopril-hydrochlorothiazide (PRINZIDE,ZESTORETIC) 10-12.5 MG tablet Take 1 tablet by mouth daily. 90 tablet 3  . Multiple Vitamins-Minerals (EYE VITAMINS PO) Take 2 capsules by mouth daily.     . rivaroxaban (XARELTO) 20 MG TABS tablet Take 1 tablet (20 mg total) by mouth daily with supper. 90 tablet 3  . acetaminophen (TYLENOL) 500 MG tablet Take 500 mg by mouth as needed.     No current facility-administered  medications for this encounter.     No Known Allergies  Social History   Socioeconomic History  . Marital status: Single    Spouse name: Not on file  . Number of children: Not on file  . Years of education: Not on file  . Highest education level: Not on file  Occupational History  . Not on file  Social Needs  . Financial resource strain: Not on file  . Food insecurity:    Worry: Not on file    Inability: Not on file  . Transportation needs:    Medical: Not on file    Non-medical: Not on file  Tobacco Use  . Smoking status: Former Smoker    Last attempt to quit: 05/20/2013    Years since quitting: 4.7  . Smokeless tobacco: Never Used  Substance and Sexual Activity  . Alcohol use: No  . Drug use: No  . Sexual activity: Yes  Lifestyle  . Physical activity:    Days per week: Not on file    Minutes per session: Not on file  . Stress: Not on file  Relationships  . Social connections:    Talks on phone: Not on file    Gets together: Not on file    Attends religious service: Not on file    Active member of club or organization: Not on file    Attends meetings of clubs or organizations: Not on file    Relationship status: Not on file  . Intimate partner  violence:    Fear of current or ex partner: Not on file    Emotionally abused: Not on file    Physically abused: Not on file    Forced sexual activity: Not on file  Other Topics Concern  . Not on file  Social History Narrative  . Not on file    Family History  Problem Relation Age of Onset  . Stroke Father   . Heart disease Father   . Colon cancer Neg Hx   . Esophageal cancer Neg Hx   . Stomach cancer Neg Hx   . Rectal cancer Neg Hx     ROS- All systems are reviewed and negative except as per the HPI above  Physical Exam: Vitals:   02/27/18 1042  BP: (!) 142/86  Pulse: (!) 104  Weight: 103.9 kg  Height: 5\' 10"  (1.778 m)   Wt Readings from Last 3 Encounters:  02/27/18 103.9 kg  01/22/18 103 kg    01/10/18 101.2 kg    Labs: Lab Results  Component Value Date   NA 140 12/01/2017   K 3.6 12/01/2017   CL 100 10/13/2017   CO2 24 10/13/2017   GLUCOSE 100 (H) 12/01/2017   BUN 11 10/13/2017   CREATININE 1.04 10/13/2017   CALCIUM 9.3 10/13/2017   No results found for: INR Lab Results  Component Value Date   CHOL 193 10/13/2017   HDL 58 10/13/2017   LDLCALC 112 (H) 10/13/2017   TRIG 114 10/13/2017     GEN- The patient is well appearing, alert and oriented x 3 today.   Head- normocephalic, atraumatic Eyes-  Sclera clear, conjunctiva pink Ears- hearing intact Oropharynx- clear Neck- supple, no JVP Lymph- no cervical lymphadenopathy Lungs- Clear to ausculation bilaterally, normal work of breathing Heart- irregular rate and rhythm, no murmurs, rubs or gallops, PMI not laterally displaced GI- soft, NT, ND, + BS Extremities- no clubbing, cyanosis, or edema MS- no significant deformity or atrophy Skin- no rash or lesion Psych- euthymic mood, full affect Neuro- strength and sensation are intact  EKG- afib at 104 bpm, qrs int 86 ms, qtc 457 ms (QTc 435 in SR) Echo-Study Conclusions  - Left ventricle: The cavity size was normal. Wall thickness was   increased in a pattern of mild LVH. Systolic function was mildly   to moderately reduced. The estimated ejection fraction was in the   range of 40% to 45%. Diffuse hypokinesis. There was no evidence   of elevated ventricular filling pressure by Doppler parameters. - Left atrium: The atrium was mildly dilated. Volume/bsa, ES,   (1-plane Simpson&'s, A2C): 35.3 ml/m^2. Left atrium 41 mm  Assessment and Plan: 1. Persistent  afib Seems to be present since this past summer Had successful cardioversion but ERAF He has been  rate controlled and has not on BB He is on xarelto20 mg for a chadsvasc score of 2(htn,lv dysfunction) states no missed anticoagulation x 3 weeks No benadryl  PharmD screened drugs  While he is in  the  hospital, BP needs to be followed and restart ACE as necessary He is not symptomatic but would be best to restore SR for his LV dysfunction which may worsen over time bmet/mag  Awaiting bed availability for admission later today  Butch Penny C. Deborra Phegley, [facility] [address] [place], [place] [address] [phone]

## 2018-02-27 NOTE — Progress Notes (Signed)
Pharmacy Review for Dofetilide (Tikosyn) Initiation  Admit Complaint: 57 y.o. male admitted 02/27/2018 with atrial fibrillation to be initiated on dofetilide.   Assessment:  Patient Exclusion Criteria: If any screening criteria checked as "Yes", then  patient  should NOT receive dofetilide until criteria item is corrected. If "Yes" please indicate correction plan.  YES  NO Patient  Exclusion Criteria Correction Plan  []  []  Baseline QTc interval is greater than or equal to 440 msec. IF above YES box checked dofetilide contraindicated unless patient has ICD; then may proceed if QTc 500-550 msec or with known ventricular conduction abnormalities may proceed with QTc 550-600 msec. QTc =  435 per MD note   []  [x]  Magnesium level is less than 1.8 mEq/l : Last magnesium:  Lab Results  Component Value Date   MG 2.0 02/27/2018         []  [x]  Potassium level is less than 4 mEq/l : Last potassium:  Lab Results  Component Value Date   K 4.1 02/27/2018         []  [x]  Patient is known or suspected to have a digoxin level greater than 2 ng/ml: No results found for: DIGOXIN    []  [x]  Creatinine clearance less than 20 ml/min (calculated using Cockcroft-Gault, actual body weight and serum creatinine): Estimated Creatinine Clearance: 104.6 mL/min (by C-G formula based on SCr of 0.95 mg/dL).    []  [x]  Patient has received drugs known to prolong the QT intervals within the last 48 hours (phenothiazines, tricyclics or tetracyclic antidepressants, erythromycin, H-1 antihistamines, cisapride, fluoroquinolones, azithromycin). Drugs not listed above may have an, as yet, undetected potential to prolong the QT interval, updated information on QT prolonging agents is available at this website:QT prolonging agents   []  [x]  Patient received a dose of hydrochlorothiazide (Oretic) alone or in any combination including triamterene (Dyazide, Maxzide) in the last 48 hours. Hydrochlorothiazide was on hold prior to  admission  []  [x]  Patient received a medication known to increase dofetilide plasma concentrations prior to initial dofetilide dose:  . Trimethoprim (Primsol, Proloprim) in the last 36 hours . Verapamil (Calan, Verelan) in the last 36 hours or a sustained release dose in the last 72 hours . Megestrol (Megace) in the last 5 days  . Cimetidine (Tagamet) in the last 6 hours . Ketoconazole (Nizoral) in the last 24 hours . Itraconazole (Sporanox) in the last 48 hours  . Prochlorperazine (Compazine) in the last 36 hours    []  [x]  Patient is known to have a history of torsades de pointes; congenital or acquired long QT syndromes.   []  [x]  Patient has received a Class 1 antiarrhythmic with less than 2 half-lives since last dose. (Disopyramide, Quinidine, Procainamide, Lidocaine, Mexiletine, Flecainide, Propafenone)   []  [x]  Patient has received amiodarone therapy in the past 3 months or amiodarone level is greater than 0.3 ng/ml.    Patient has been appropriately anticoagulated with Xarelto.  Ordering provider was confirmed at [email or web address] if they are not listed on the [place] Prescribers list.  Goal of Therapy: Follow renal function, electrolytes, potential drug interactions, and dose adjustment. Provide education and 1 week supply at discharge.  Plan:  [x]   Physician selected initial dose within range recommended for patients level of renal function - will monitor for response.  []   Physician selected initial dose outside of range recommended for patients level of renal function - will discuss if the dose should be altered at this time.   Select One Calculated CrCl  Dose  q12h  [x]  > 60 ml/min 500 mcg  []  40-60 ml/min 250 mcg  []  20-40 ml/min 125 mcg   2. Follow up QTc after the first 5 doses, renal function, electrolytes (K & Mg) daily x 3     days, dose adjustment, success of initiation and facilitate 1 week discharge supply as     clinically indicated.  3. Initiate  Tikosyn education video (Call [phone] and ask for Tikosyn Video # [phone]).  Hildred Laser, PharmD Clinical Pharmacist **Pharmacist phone directory can now be found on [email or web address] ([email or web address]).  Listed under [place].

## 2018-02-27 NOTE — Progress Notes (Signed)
Primary Care Physician: Denita Lung, MD Referring Physician: Dr. Inez Catalina is a 57 y.o. male with a h/o HTN, asymptomatic, newly diagnosed afib in August of this year being referred to Dr. Gwenlyn Found at that time. He was cardioverted,12/01/17, after sufficient time of being anticoagulanted. This was successful but he was back in afib on f/u with Dr. Gwenlyn Found.Echo showed an EF of 40-45%. It was discussed with pt even thought he is asymptomatic his reduced EF may be 2/2 to  [facility] and it would be beneficial to try to restore SR going forward. He currently does not drink or smoke. Mod caffeine use. Denies a snoring history.   F/u in afib clinic, 12/10. He is here for Tikosyn admit. He stopped lisinopril/hctz last Thursday. He is aware of cost of drug and can afford.   Today, he denies symptoms of palpitations, chest pain, shortness of breath, orthopnea, PND, lower extremity edema, dizziness, presyncope, syncope, or neurologic sequela. The patient is tolerating medications without difficulties and is otherwise without complaint today.   Past Medical History:  Diagnosis Date  . Allergy    seasonal  . Depression   . Hypertension   . Obesity    Past Surgical History:  Procedure Laterality Date  . CARDIOVERSION N/A 12/01/2017   Procedure: CARDIOVERSION;  Surgeon: Larey Dresser, MD;  Location: [facility] ENDOSCOPY;  Service: Cardiovascular;  Laterality: N/A;  . NO PAST SURGERIES    . WISDOM TOOTH EXTRACTION     Home medicines reviewed  No Known Allergies  Social History   Socioeconomic History  . Marital status: Single    Spouse name: Not on file  . Number of children: Not on file  . Years of education: Not on file  . Highest education level: Not on file  Occupational History  . Not on file  Social Needs  . Financial resource strain: Not on file  . Food insecurity:    Worry: Not on file    Inability: Not on file  . Transportation needs:    Medical: Not on file    Non-medical: Not  on file  Tobacco Use  . Smoking status: Former Smoker    Last attempt to quit: 05/20/2013    Years since quitting: 4.7  . Smokeless tobacco: Never Used  Substance and Sexual Activity  . Alcohol use: No  . Drug use: No  . Sexual activity: Yes  Lifestyle  . Physical activity:    Days per week: Not on file    Minutes per session: Not on file  . Stress: Not on file  Relationships  . Social connections:    Talks on phone: Not on file    Gets together: Not on file    Attends religious service: Not on file    Active member of club or organization: Not on file    Attends meetings of clubs or organizations: Not on file    Relationship status: Not on file  . Intimate partner violence:    Fear of current or ex partner: Not on file    Emotionally abused: Not on file    Physically abused: Not on file    Forced sexual activity: Not on file  Other Topics Concern  . Not on file  Social History Narrative  . Not on file    Family History  Problem Relation Age of Onset  . Stroke Father   . Heart disease Father   . Colon cancer Neg Hx   . Esophageal  cancer Neg Hx   . Stomach cancer Neg Hx   . Rectal cancer Neg Hx     ROS- All systems are reviewed and negative except as per the HPI above  Physical Exam: Vitals reviewed in epic Labs: Lab Results  Component Value Date   NA 140 02/27/2018   K 4.1 02/27/2018   CL 109 02/27/2018   CO2 23 02/27/2018   GLUCOSE 101 (H) 02/27/2018   BUN 13 02/27/2018   CREATININE 0.95 02/27/2018   CALCIUM 8.9 02/27/2018   MG 2.0 02/27/2018   No results found for: INR Lab Results  Component Value Date   CHOL 193 10/13/2017   HDL 58 10/13/2017   LDLCALC 112 (H) 10/13/2017   TRIG 114 10/13/2017    GEN- The patient is well appearing, alert and oriented x 3 today.   Head- normocephalic, atraumatic Eyes-  Sclera clear, conjunctiva pink Ears- hearing intact Oropharynx- clear Neck- supple,   Lungs- Clear to ausculation bilaterally, normal work of  breathing Heart- irregular rate and rhythm, no murmurs, rubs or gallops, PMI not laterally displaced GI- soft, NT, ND, + BS Extremities- no clubbing, cyanosis, or edema MS- no significant deformity or atrophy Skin- no rash or lesion Psych- euthymic mood, full affect Neuro- strength and sensation are intact  EKG- afib at 104 bpm, qrs int 86 ms, qtc 457 ms (QTc 435 in SR) Echo-Study Conclusions  - Left ventricle: The cavity size was normal. Wall thickness was   increased in a pattern of mild LVH. Systolic function was mildly   to moderately reduced. The estimated ejection fraction was in the   range of 40% to 45%. Diffuse hypokinesis. There was no evidence   of elevated ventricular filling pressure by Doppler parameters. - Left atrium: The atrium was mildly dilated. Volume/bsa, ES,   (1-plane Simpson&'s, A2C): 35.3 ml/m^2. Left atrium 41 mm  Assessment and Plan: 1. Persistent  afib Seems to be present since this past summer Had successful cardioversion but ERAF He has been  rate controlled and has not on BB He is on xarelto20 mg for a chadsvasc score of 2(htn,lv dysfunction) states no missed anticoagulation x 3 weeks No benadryl  PharmD screened drugs  While he is in  the hospital, BP needs to be followed and restart ACE as necessary He is not symptomatic but would be best to restore SR for his LV dysfunction which may worsen over time bmet/mag  Awaiting bed availability for admission later today  Butch Penny C. Carroll, [facility] [address] [place], [place] [address] [phone]   I have seen, examined the patient, and reviewed the above assessment and plan.  Changes to above are made where necessary.  On exam, iRRR.  He reports compliance with xarelto without interruption.  QT is reviewed and stable to receive first dose of tikosyn.  Will optimize CHF medicines while here.    Co Sign: Thompson Grayer, MD 02/27/2018 5:22 PM

## 2018-02-28 LAB — BASIC METABOLIC PANEL
Anion gap: 7 (ref 5–15)
BUN: 11 mg/dL (ref 6–20)
CALCIUM: 8.4 mg/dL — AB (ref 8.9–10.3)
CO2: 25 mmol/L (ref 22–32)
Chloride: 107 mmol/L (ref 98–111)
Creatinine, Ser: 0.96 mg/dL (ref 0.61–1.24)
GFR calc Af Amer: >60 mL/min (ref >60)
GFR calc non Af Amer: >60 mL/min (ref >60)
GLUCOSE: 103 mg/dL — AB (ref 70–99)
Potassium: 3.8 mmol/L (ref 3.5–5.1)
Sodium: 139 mmol/L (ref 135–145)

## 2018-02-28 LAB — MAGNESIUM: Magnesium: 2.2 mg/dL (ref 1.7–2.4)

## 2018-02-28 LAB — HIV ANTIBODY (ROUTINE TESTING W REFLEX): HIV Screen 4th Generation wRfx: NONREACTIVE

## 2018-02-28 MED ORDER — SODIUM CHLORIDE 0.9 % IV SOLN: 250.0000 mL | INTRAVENOUS | Status: DC

## 2018-02-28 MED ORDER — SODIUM CHLORIDE 0.9% FLUSH: 3.0000 mL | INTRAVENOUS | Status: DC | PRN

## 2018-02-28 MED ORDER — SODIUM CHLORIDE 0.9% FLUSH
3.0000 mL | Freq: Two times a day (BID) | INTRAVENOUS | Status: DC
  Administered 2018-02-28 – 2018-03-02 (×4): 3 mL via INTRAVENOUS

## 2018-02-28 MED ORDER — POTASSIUM CHLORIDE CRYS ER 20 MEQ PO TBCR
40.0000 meq | EXTENDED_RELEASE_TABLET | Freq: Once | ORAL | Status: AC
  Administered 2018-02-28: 40 meq via ORAL
  Filled 2018-02-28: qty 2

## 2018-02-28 MED ORDER — HYDROCORTISONE 1 % EX CREA
1.0000 "application " | TOPICAL_CREAM | Freq: Three times a day (TID) | CUTANEOUS | Status: DC | PRN
  Filled 2018-02-28: qty 28

## 2018-02-28 MED ORDER — LISINOPRIL 10 MG PO TABS
10.0000 mg | ORAL_TABLET | Freq: Every day | ORAL | Status: DC
  Administered 2018-02-28 – 2018-03-02 (×3): 10 mg via ORAL
  Filled 2018-02-28 (×3): qty 1

## 2018-02-28 NOTE — Discharge Instructions (Addendum)
You have an appointment set up with the [facility].  Multiple studies have shown that being followed by a dedicated atrial fibrillation clinic in addition to the standard care you receive from your other physicians improves health. We believe that enrollment in the atrial fibrillation clinic will allow [place] to better care for you.   The phone number to the [facility] is [phone]. The clinic is staffed Monday through Friday from 8:30am to 5pm.  Parking Directions: The clinic is located in the Heart and Vascular Building connected to [facility]. 1)From [address] turn on to [address] and go to the 3rd entrance  (Heart and Vascular entrance) on the right. 2)Look to the right for Heart &Vascular Parking Garage. 3)A code for the entrance is required please call the clinic to receive this.   4)Take the elevators to the 1st floor. Registration is in the room with the glass walls at the end of the hallway.  If you have any trouble parking or locating the clinic, please dont hesitate to call [phone].  Information on my medicine - XARELTO (Rivaroxaban)  Why was Xarelto prescribed for you? Xarelto was prescribed for you to reduce the risk of a blood clot forming that can cause a stroke if you have a medical condition called atrial fibrillation (a type of irregular heartbeat).  What do you need to know about xarelto ? Take your Xarelto ONCE DAILY at the same time every day with your evening meal. If you have difficulty swallowing the tablet whole, you may crush it and mix in applesauce just prior to taking your dose.  Take Xarelto exactly as prescribed by your doctor and DO NOT stop taking Xarelto without talking to the doctor who prescribed the medication.  Stopping without other stroke prevention medication to take the place of Xarelto may increase your risk of developing a clot that causes a stroke.  Refill your prescription before you  run out.  After discharge, you should have regular check-up appointments with your healthcare provider that is prescribing your Xarelto.  In the future your dose may need to be changed if your kidney function or weight changes by a significant amount.  What do you do if you miss a dose? If you are taking Xarelto ONCE DAILY and you miss a dose, take it as soon as you remember on the same day then continue your regularly scheduled once daily regimen the next day. Do not take two doses of Xarelto at the same time or on the same day.   Important Safety Information A possible side effect of Xarelto is bleeding. You should call your healthcare provider right away if you experience any of the following: ? Bleeding from an injury or your nose that does not stop. ? Unusual colored urine (red or dark brown) or unusual colored stools (red or black). ? Unusual bruising for unknown reasons. ? A serious fall or if you hit your head (even if there is no bleeding).  Some medicines may interact with Xarelto and might increase your risk of bleeding while on Xarelto. To help avoid this, consult your healthcare provider or pharmacist prior to using any new prescription or non-prescription medications, including herbals, vitamins, non-steroidal anti-inflammatory drugs (NSAIDs) and supplements.  This website has more information on Xarelto: [email or web address]

## 2018-02-28 NOTE — Progress Notes (Addendum)
Progress Note  Patient Name: Daniel Romero Date of Encounter: 02/28/2018  Primary Cardiologist: Dr. Gwenlyn Found  Subjective   No complaints, worries the medicine won't work  Inpatient Medications    Scheduled Meds: . dofetilide  500 mcg Oral BID  . Influenza vac split quadrivalent PF  0.5 mL Intramuscular Tomorrow-1000  . rivaroxaban  20 mg Oral Q breakfast  . sodium chloride flush  3 mL Intravenous Q12H   Continuous Infusions: . sodium chloride Stopped (02/28/18 0109)   PRN Meds: sodium chloride, sodium chloride flush   Vital Signs    Vitals:   02/27/18 1726 02/27/18 1928 02/28/18 0418  BP: (!) 149/118 (!) 155/110 (!) 123/95  Pulse: 78 77   Temp: 97.7 F (36.5 C)    TempSrc: Oral    SpO2: 99% 99%   Weight: 101.1 kg  101.1 kg  Height: 5\' 10"  (1.778 m)  5\' 10"  (1.778 m)    Intake/Output Summary (Last 24 hours) at 02/28/2018 0852 Last data filed at 02/28/2018 0200 Gross per 24 hour  Intake 67.12 ml  Output -  Net 67.12 ml   Filed Weights   02/27/18 1726 02/28/18 0418  Weight: 101.1 kg 101.1 kg    Telemetry    AFib generally 90's.  - Personally Reviewed  ECG     Afib, QTc is OK- Personally Reviewed  Physical Exam   GEN: No acute distress.   Neck: No JVD Cardiac: irreg-irreg, no murmurs, rubs, or gallops.  Respiratory: CTA b/l. GI: Soft, nontender, non-distended  MS: No edema; No deformity. Neuro:  Nonfocal  Psych: Normal affect   Labs    Chemistry Recent Labs  Lab 02/27/18 1038 02/28/18 0424  NA 140 139  K 4.1 3.8  CL 109 107  CO2 23 25  GLUCOSE 101* 103*  BUN 13 11  CREATININE 0.95 0.96  CALCIUM 8.9 8.4*  GFRNONAA >60 >60  GFRAA >60 >60  ANIONGAP 8 7     HematologyNo results for input(s): WBC, RBC, HGB, HCT, MCV, MCH, MCHC, RDW, PLT in the last 168 hours.  Cardiac EnzymesNo results for input(s): TROPONINI in the last 168 hours. No results for input(s): TROPIPOC in the last 168 hours.   BNPNo results for input(s): BNP, PROBNP in  the last 168 hours.   DDimer No results for input(s): DDIMER in the last 168 hours.   Radiology    No results found.  Cardiac Studies   11/15/17: TTE Study Conclusions - Left ventricle: The cavity size was normal. Wall thickness was   increased in a pattern of mild LVH. Systolic function was mildly   to moderately reduced. The estimated ejection fraction was in the   range of 40% to 45%. Diffuse hypokinesis. There was no evidence   of elevated ventricular filling pressure by Doppler parameters. - Left atrium: The atrium was mildly dilated. Volume/bsa, ES,   (1-plane Simpson&'s, A2C): 35.3 ml/m^2.  Patient Profile     57 y.o. male with PMHx of HTN, depression and fairly new AFib, admitted for Tikosyn initiation.  Assessment & Plan    1. Persistent AFib     CHA2DS2Vasc is 2, on Xarelto, appropriately dosed     K+ 3.8 (replacement ordered)     Mag 2.2     Creat 0.96 (stable)     QT looks OK  DCCV tomorrow if not in SR  2. CM     Hopefully will improve with SR     Does not appear fluid OL  3.  HTN     Resume his ACE today  For questions or updates, please contact [place] Please consult [email or web address] for contact info under        Signed, Baldwin [place], PA-C  02/28/2018, 8:52 AM     I have seen, examined the patient, and reviewed the above assessment and plan.  Changes to above are made where necessary.  On exam, iRRR.  Continue current strategy.  Add ace inhibitor.  NPO after midnight.  Co Sign: Thompson Grayer, MD 02/28/2018 11:27 AM

## 2018-02-28 NOTE — H&P (View-Only) (Signed)
Progress Note  Patient Name: Daniel Romero Date of Encounter: 02/28/2018  Primary Cardiologist: Dr. Gwenlyn Found  Subjective   No complaints, worries the medicine won't work  Inpatient Medications    Scheduled Meds: . dofetilide  500 mcg Oral BID  . Influenza vac split quadrivalent PF  0.5 mL Intramuscular Tomorrow-1000  . rivaroxaban  20 mg Oral Q breakfast  . sodium chloride flush  3 mL Intravenous Q12H   Continuous Infusions: . sodium chloride Stopped (02/28/18 0109)   PRN Meds: sodium chloride, sodium chloride flush   Vital Signs    Vitals:   02/27/18 1726 02/27/18 1928 02/28/18 0418  BP: (!) 149/118 (!) 155/110 (!) 123/95  Pulse: 78 77   Temp: 97.7 F (36.5 C)    TempSrc: Oral    SpO2: 99% 99%   Weight: 101.1 kg  101.1 kg  Height: 5\' 10"  (1.778 m)  5\' 10"  (1.778 m)    Intake/Output Summary (Last 24 hours) at 02/28/2018 0852 Last data filed at 02/28/2018 0200 Gross per 24 hour  Intake 67.12 ml  Output -  Net 67.12 ml   Filed Weights   02/27/18 1726 02/28/18 0418  Weight: 101.1 kg 101.1 kg    Telemetry    AFib generally 90's.  - Personally Reviewed  ECG     Afib, QTc is OK- Personally Reviewed  Physical Exam   GEN: No acute distress.   Neck: No JVD Cardiac: irreg-irreg, no murmurs, rubs, or gallops.  Respiratory: CTA b/l. GI: Soft, nontender, non-distended  MS: No edema; No deformity. Neuro:  Nonfocal  Psych: Normal affect   Labs    Chemistry Recent Labs  Lab 02/27/18 1038 02/28/18 0424  NA 140 139  K 4.1 3.8  CL 109 107  CO2 23 25  GLUCOSE 101* 103*  BUN 13 11  CREATININE 0.95 0.96  CALCIUM 8.9 8.4*  GFRNONAA >60 >60  GFRAA >60 >60  ANIONGAP 8 7     HematologyNo results for input(s): WBC, RBC, HGB, HCT, MCV, MCH, MCHC, RDW, PLT in the last 168 hours.  Cardiac EnzymesNo results for input(s): TROPONINI in the last 168 hours. No results for input(s): TROPIPOC in the last 168 hours.   BNPNo results for input(s): BNP, PROBNP in  the last 168 hours.   DDimer No results for input(s): DDIMER in the last 168 hours.   Radiology    No results found.  Cardiac Studies   11/15/17: TTE Study Conclusions - Left ventricle: The cavity size was normal. Wall thickness was   increased in a pattern of mild LVH. Systolic function was mildly   to moderately reduced. The estimated ejection fraction was in the   range of 40% to 45%. Diffuse hypokinesis. There was no evidence   of elevated ventricular filling pressure by Doppler parameters. - Left atrium: The atrium was mildly dilated. Volume/bsa, ES,   (1-plane Simpson&'s, A2C): 35.3 ml/m^2.  Patient Profile     57 y.o. male with PMHx of HTN, depression and fairly new AFib, admitted for Tikosyn initiation.  Assessment & Plan    1. Persistent AFib     CHA2DS2Vasc is 2, on Xarelto, appropriately dosed     K+ 3.8 (replacement ordered)     Mag 2.2     Creat 0.96 (stable)     QT looks OK  DCCV tomorrow if not in SR  2. CM     Hopefully will improve with SR     Does not appear fluid OL  3.  HTN     Resume his ACE today  For questions or updates, please contact [place] Please consult [email or web address] for contact info under        Signed, Baldwin Jamaica, PA-C  02/28/2018, 8:52 AM     I have seen, examined the patient, and reviewed the above assessment and plan.  Changes to above are made where necessary.  On exam, iRRR.  Continue current strategy.  Add ace inhibitor.  NPO after midnight.  Co Sign: Thompson Grayer, MD 02/28/2018 11:27 AM

## 2018-02-28 NOTE — Progress Notes (Signed)
Pharmacy Consult for [place] Electrolyte Replacement  Pharmacy consulted to assist in monitoring and replacing electrolytes in this 57 y.o. male admitted on 02/27/2018 undergoing dofetilide initiation. First dofetilide dose: 500 mcg  Labs:    Component Value Date/Time   K 3.8 02/28/2018 0424   MG 2.2 02/28/2018 0424     Plan: Potassium: K 3.8-3.9:  Give KCl 40 mEq po x1 then give Tikosyn at least 2hr after KCl dose - do not need to recheck K   Already had received 40 mEq this morning per EP - agree with replacement plan.  Magnesium: Mg >1.8: No additional supplementation needed  Thank you for allowing pharmacy to participate in this patient's care   Antonietta Jewel, PharmD, [place] Clinical Pharmacist  Pager: [phone] Phone: [phone] 02/28/2018  8:41 AM

## 2018-02-28 NOTE — Progress Notes (Signed)
QTc 517 post 3rd dose of Tikosyn.

## 2018-02-28 NOTE — Plan of Care (Signed)
  Problem: Education: Goal: Knowledge of disease or condition will improve Outcome: Completed/Met   Problem: Activity: Goal: Ability to tolerate increased activity will improve Outcome: Completed/Met

## 2018-02-28 NOTE — Care Management (Addendum)
#    3.  S/W   BREANNA  @  [facility] #  [id number]   TIKOSYN  375 MCG : NONE FORMULARY  1. TIKOSYN   125  MCG     250 MCG   500 MCG  BID COVER- YES CO-PAY- $ 80.00  FOR EACH PRESCRIPTION TIER- 2 DRUG PRIOR APPROVAL- NO   DOFETILIDE  375 MC G : NONE FORMULARY  2. DOFETILIDE  125 MCG  250 MCG    500 MCG  BID COVER- YES CO-PAY- $ 40.00  FOR EACH PRESCRIPTION TIER - 2 DRUG PRIOR APPROVAL- NO  NO DEDUCTIBLE   PREFERRED PHARMACY : YES [facility] AND  [facility]

## 2018-03-01 ENCOUNTER — Inpatient Hospital Stay (HOSPITAL_COMMUNITY): Payer: 59 | Admitting: Anesthesiology

## 2018-03-01 ENCOUNTER — Encounter (HOSPITAL_COMMUNITY): Payer: Self-pay | Admitting: *Deleted

## 2018-03-01 ENCOUNTER — Encounter (HOSPITAL_COMMUNITY)
Admission: RE | Disposition: A | Discharge status: Home / Self Care | Payer: Self-pay | Source: Ambulatory Visit | Attending: Internal Medicine

## 2018-03-01 DIAGNOSIS — I10 Essential (primary) hypertension: Secondary | ICD-10-CM

## 2018-03-01 HISTORY — PX: CARDIOVERSION: SHX1299

## 2018-03-01 LAB — BASIC METABOLIC PANEL
Anion gap: 6 (ref 5–15)
BUN: 11 mg/dL (ref 6–20)
CO2: 25 mmol/L (ref 22–32)
Calcium: 8.6 mg/dL — ABNORMAL LOW (ref 8.9–10.3)
Chloride: 108 mmol/L (ref 98–111)
Creatinine, Ser: 1.09 mg/dL (ref 0.61–1.24)
GFR calc Af Amer: >60 mL/min (ref >60)
GFR calc non Af Amer: >60 mL/min (ref >60)
Glucose, Bld: 106 mg/dL — ABNORMAL HIGH (ref 70–99)
Potassium: 4.2 mmol/L (ref 3.5–5.1)
Sodium: 139 mmol/L (ref 135–145)

## 2018-03-01 LAB — MAGNESIUM: Magnesium: 2 mg/dL (ref 1.7–2.4)

## 2018-03-01 SURGERY — CARDIOVERSION
Anesthesia: General

## 2018-03-01 MED ORDER — PROPOFOL 10 MG/ML IV BOLUS
INTRAVENOUS | Status: DC | PRN
  Administered 2018-03-01 (×4): 50 mg via INTRAVENOUS

## 2018-03-01 MED ORDER — LIDOCAINE 2% (20 MG/ML) 5 ML SYRINGE
INTRAMUSCULAR | Status: DC | PRN
  Administered 2018-03-01: 60 mg via INTRAVENOUS
  Administered 2018-03-01: 50 mg via INTRAVENOUS

## 2018-03-01 MED ORDER — SODIUM CHLORIDE 0.9 % IV SOLN
INTRAVENOUS | Status: DC
  Administered 2018-03-01: 13:00:00 via INTRAVENOUS

## 2018-03-01 NOTE — Progress Notes (Signed)
Pharmacy Consult for [place] Electrolyte Replacement  Pharmacy consulted to assist in monitoring and replacing electrolytes in this 57 y.o. male admitted on 02/27/2018 undergoing dofetilide initiation. First dofetilide dose: 500 mcg  Labs:    Component Value Date/Time   K 4.2 03/01/2018 0344   MG 2.0 03/01/2018 0344     Plan: Potassium: K >/= 4: No additional supplementation required  Already had received 40 mEq this morning per EP - agree with replacement plan.  Magnesium: Mg >1.8: No additional supplementation needed  Thank you for allowing pharmacy to participate in this patient's care   Erin Hearing PharmD., BCPS Clinical Pharmacist 03/01/2018 3:17 PM

## 2018-03-01 NOTE — CV Procedure (Signed)
Procedure: Electrical Cardioversion Indications:  Atrial Fibrillation  Procedure Details:  Consent: Risks of procedure as well as the alternatives and risks of each were explained to the (patient/caregiver).  Consent for procedure obtained.  Time Out: Verified patient identification, verified procedure, site/side was marked, verified correct patient position, special equipment/implants available, medications/allergies/relevent history reviewed, required imaging and test results available. PERFORMED.  Patient placed on cardiac monitor, pulse oximetry, supplemental oxygen as necessary.  Sedation given: per anesthesia Pacer pads placed anterior and posterior chest.  Cardioverted 3 time(s).  Cardioversion with synchronized biphasic 120 J, 200 J, 200 J shock.  Evaluation:  With sedation the patient spontaneous converted to sinus rhythm, however approximately 1 to 2 minutes later, he returned to atrial fibrillation.  Therefore we proceeded with attempt at DC cardioversion.  Findings: Post procedure EKG shows: Atrial Fibrillation Complications: None Patient did tolerate procedure well.  Time Spent Directly with the Patient:  30 minutes   Elouise Munroe 03/01/2018, 7:27 PM

## 2018-03-01 NOTE — Interval H&P Note (Signed)
History and Physical Interval Note:  03/01/2018 1:01 PM  Daniel Romero  has presented today for surgery, with the diagnosis of afib  The various methods of treatment have been discussed with the patient and family. After consideration of risks, benefits and other options for treatment, the patient has consented to  Procedure(s): CARDIOVERSION as a surgical intervention .  The patient's history has been reviewed, patient examined, no change in status, stable for surgery.  I have reviewed the patient's chart and labs.  Questions were answered to the patient's satisfaction.     Elouise Munroe

## 2018-03-01 NOTE — Anesthesia Preprocedure Evaluation (Signed)
Anesthesia Evaluation  Patient identified by MRN, date of birth, ID band Patient awake    Reviewed: Allergy & Precautions, NPO status , Patient's Chart, lab work & pertinent test results  Airway Mallampati: II  TM Distance: >3 FB Neck ROM: Full    Dental  (+) Teeth Intact, Dental Advisory Given   Pulmonary former smoker,    Pulmonary exam normal breath sounds clear to auscultation       Cardiovascular hypertension, Pt. on medications + dysrhythmias Atrial Fibrillation  Rhythm:Irregular Rate:Abnormal     Neuro/Psych PSYCHIATRIC DISORDERS Depression negative neurological ROS     GI/Hepatic negative GI ROS, Neg liver ROS,   Endo/Other  negative endocrine ROSObesity   Renal/GU negative Renal ROS     Musculoskeletal negative musculoskeletal ROS (+)   Abdominal   Peds  Hematology  (+) Blood dyscrasia (Xarelto), ,   Anesthesia Other Findings Day of surgery medications reviewed with the patient.  Reproductive/Obstetrics                             Anesthesia Physical Anesthesia Plan  ASA: III  Anesthesia Plan: General   Post-op Pain Management:    Induction: Intravenous  PONV Risk Score and Plan: 2 and Treatment may vary due to age or medical condition  Airway Management Planned: Mask  Additional Equipment:   Intra-op Plan:   Post-operative Plan:   Informed Consent: I have reviewed the patients History and Physical, chart, labs and discussed the procedure including the risks, benefits and alternatives for the proposed anesthesia with the patient or authorized representative who has indicated his/her understanding and acceptance.   Dental advisory given  Plan Discussed with: CRNA  Anesthesia Plan Comments:         Anesthesia Quick Evaluation

## 2018-03-01 NOTE — Care Management Note (Addendum)
Case Management Note  Patient Details  Name: Daniel Romero MRN: [id number] Date of Birth: 08-Nov-1960  Subjective/Objective:                    Action/Plan:  Spoke w Olivia Mackie in [place] [phone]. She will change listed pharmacy to [facility] so 7 day Tikosyn and DC meds can be filled there by Friday at 5:00pm.   MD please send Tikosyn 30 day script to [facility] on [address], spoke to pharmacist who states that it will be in by tomorrow evening.  Expected Discharge Date:                  Expected Discharge Plan:     In-House Referral:     Discharge planning Services     Post Acute Care Choice:    Choice offered to:     DME Arranged:    DME Agency:     HH Arranged:    HH Agency:     Status of Service:     If discussed at [employer], dates discussed:    Additional Comments:  Carles Collet, RN 03/01/2018, 10:42 AM

## 2018-03-01 NOTE — Transfer of Care (Signed)
Immediate Anesthesia Transfer of Care Note  Patient: Daniel Romero  Procedure(s) Performed: CARDIOVERSION (N/A )  Patient Location: Endoscopy Unit  Anesthesia Type:General  Level of Consciousness: awake, alert  and oriented  Airway & Oxygen Therapy: Patient Spontanous Breathing and Patient connected to face mask  Post-op Assessment: Report given to RN and Post -op Vital signs reviewed and stable  Post vital signs: Reviewed and stable  Last Vitals:  Vitals Value Taken Time  BP 138/98   Temp    Pulse 118   Resp 16   SpO2 100     Last Pain:  Vitals:   03/01/18 1245  TempSrc: Oral  PainSc: 0-No pain         Complications: No apparent anesthesia complications

## 2018-03-01 NOTE — Plan of Care (Signed)
  Problem: Education: Goal: Understanding of medication regimen will improve Outcome: Progressing   Problem: Health Behavior/Discharge Planning: Goal: Ability to safely manage health-related needs after discharge will improve Outcome: Progressing   Problem: Education: Goal: Knowledge of General Education information will improve Description Including pain rating scale, medication(s)/side effects and non-pharmacologic comfort measures Outcome: Progressing   Problem: Clinical Measurements: Goal: Respiratory complications will improve Outcome: Completed/Met   Problem: Activity: Goal: Risk for activity intolerance will decrease Outcome: Completed/Met

## 2018-03-01 NOTE — Progress Notes (Addendum)
Electrophysiology Rounding Note  Patient Name: Daniel Romero Date of Encounter: 03/01/2018  Primary Cardiologist: Dr Daniel Romero Electrophysiologist: Dr Daniel Romero   Subjective   The patient is doing well today. Remains in afib, plan for DCCV today. At this time, the patient denies chest pain, shortness of breath, or any new concerns. He has not eaten anything this AM.  Inpatient Medications    Scheduled Meds: . dofetilide  500 mcg Oral BID  . Influenza vac split quadrivalent PF  0.5 mL Intramuscular Tomorrow-1000  . lisinopril  10 mg Oral Daily  . rivaroxaban  20 mg Oral Q breakfast  . sodium chloride flush  3 mL Intravenous Q12H  . sodium chloride flush  3 mL Intravenous Q12H   Continuous Infusions: . sodium chloride Stopped (02/28/18 0109)  . sodium chloride     PRN Meds: sodium chloride, hydrocortisone cream, sodium chloride flush, sodium chloride flush   Vital Signs    Vitals:   02/28/18 1500 02/28/18 1932 03/01/18 0504 03/01/18 0808  BP: 118/86 (!) 132/99 (!) 124/96 (!) 119/108  Pulse: 88 86 71   Resp: 14 16 17    Temp: 98 F (36.7 C) 98.4 F (36.9 C) 98 F (36.7 C)   TempSrc: Oral Oral Oral   SpO2: 98% 98% 99%   Weight:   100.5 kg   Height:        Intake/Output Summary (Last 24 hours) at 03/01/2018 0810 Last data filed at 02/28/2018 2212 Gross per 24 hour  Intake 480 ml  Output -  Net 480 ml   Filed Weights   02/27/18 1726 02/28/18 0418 03/01/18 0504  Weight: 101.1 kg 101.1 kg 100.5 kg    Physical Exam    GEN- The patient is well appearing, alert and oriented x 3 today.   Head- normocephalic, atraumatic Eyes-  Sclera clear, conjunctiva pink Ears- hearing intact Oropharynx- clear Neck- supple Lungs- Clear to ausculation bilaterally, normal work of breathing Heart- irregularly irregular rate and rhythm, no murmurs, rubs or gallops Extremities- no clubbing, cyanosis, or edema Skin- no rash or lesion Psych- euthymic mood, full affect Neuro- strength  and sensation are intact  Labs    CBC No results for input(s): WBC, NEUTROABS, HGB, HCT, MCV, PLT in the last 72 hours. Basic Metabolic Panel Recent Labs    02/28/18 0424 03/01/18 0344  NA 139 139  K 3.8 4.2  CL 107 108  CO2 25 25  GLUCOSE 103* 106*  BUN 11 11  CREATININE 0.96 1.09  CALCIUM 8.4* 8.6*  MG 2.2 2.0    Telemetry    Atrial fibrillation with RVR at times (personally reviewed) ECG - rate controlled afib. QTc 476 by manual calculation  Radiology    No results Romero.   Patient Profile     Daniel Romero is a 57 y.o. male with a past medical history significant for HTN and persistent afib.  He was admitted for dofetilide loading.   Assessment & Plan    1. Persistent atrial fibrillation CHADS2VASC score is 2, on Xarelto K+4.2 Mag 2.0 Cr 1.09 QTc 476 by manual calculation, difficult to assess QT while in afib. Will reassess once patient is back in SR.  Continue dofetilide 525mcg BID DCCV today if not in SR  2. Cardiomyopathy Hopefully will improve with return to SR No signs of fluid overload  3. HTN On lisinopril/HCTZ 10-12.5mg  daily prior to admission Lisinopril 10mg  started yesterday BP this AM 119/108 May need to continue to titrate ACE   For questions  or updates, please contact [place] Please consult [email or web address] for contact info under Cardiology/STEMI.  Signed, Oliver Barre, PA  03/01/2018, 8:11 AM    I have seen, examined the patient, and reviewed the above assessment and plan.  Changes to above are made where necessary.  On exam, iRRR.  Planned for cardioversion today.  BP is controlled.  No changes currently.  Co Sign: Thompson Grayer, MD 03/01/2018 5:33 PM

## 2018-03-02 ENCOUNTER — Inpatient Hospital Stay (HOSPITAL_COMMUNITY): Payer: 59

## 2018-03-02 LAB — HEPATIC FUNCTION PANEL
ALK PHOS: 53 U/L (ref 38–126)
ALT: 28 U/L (ref 0–44)
AST: 23 U/L (ref 15–41)
Albumin: 4 g/dL (ref 3.5–5.0)
BILIRUBIN DIRECT: 0.1 mg/dL (ref 0.0–0.2)
Indirect Bilirubin: 0.7 mg/dL (ref 0.3–0.9)
Total Bilirubin: 0.8 mg/dL (ref 0.3–1.2)
Total Protein: 7.2 g/dL (ref 6.5–8.1)

## 2018-03-02 LAB — BASIC METABOLIC PANEL
Anion gap: 10 (ref 5–15)
BUN: 10 mg/dL (ref 6–20)
CO2: 22 mmol/L (ref 22–32)
Calcium: 8.7 mg/dL — ABNORMAL LOW (ref 8.9–10.3)
Chloride: 106 mmol/L (ref 98–111)
Creatinine, Ser: 0.83 mg/dL (ref 0.61–1.24)
GFR calc Af Amer: >60 mL/min (ref >60)
GFR calc non Af Amer: >60 mL/min (ref >60)
GLUCOSE: 106 mg/dL — AB (ref 70–99)
Potassium: 4.2 mmol/L (ref 3.5–5.1)
Sodium: 138 mmol/L (ref 135–145)

## 2018-03-02 LAB — TSH: TSH: 2.128 u[IU]/mL (ref 0.350–4.500)

## 2018-03-02 LAB — MAGNESIUM: Magnesium: 2 mg/dL (ref 1.7–2.4)

## 2018-03-02 MED ORDER — CARVEDILOL 6.25 MG PO TABS
6.2500 mg | ORAL_TABLET | Freq: Two times a day (BID) | ORAL | Status: DC
  Administered 2018-03-02: 6.25 mg via ORAL
  Filled 2018-03-02: qty 1

## 2018-03-02 MED ORDER — CARVEDILOL 6.25 MG PO TABS: 6.2500 mg | ORAL_TABLET | Freq: Two times a day (BID) | ORAL | 6 refills | Status: DC

## 2018-03-02 MED ORDER — AMIODARONE HCL 200 MG PO TABS: 200.0000 mg | ORAL_TABLET | Freq: Two times a day (BID) | ORAL | 3 refills | Status: DC

## 2018-03-02 MED FILL — CARVEDILOL 6.25 MG TABLET: 6.25 | 30 days supply | Qty: 60 | Fill #0 | Status: TO

## 2018-03-02 MED FILL — AMIODARONE HCL 200 MG TABS: 200 | 30 days supply | Qty: 60 | Fill #0 | Status: TO

## 2018-03-02 NOTE — Discharge Summary (Addendum)
ELECTROPHYSIOLOGY PROCEDURE DISCHARGE SUMMARY    Patient ID: Daniel Romero,  MRN: [id number], DOB/AGE: March 12, 1961 57 y.o.  Admit date: 02/27/2018 Discharge date: 03/02/2018  Primary Care Physician: Denita Lung, MD  Primary Cardiologist: Dr. Gwenlyn Found Electrophysiologist: new this admission, Dr. Rayann Heman  Primary Discharge Diagnosis:  1.  Persistent atrial fibrillation status post Tikosyn loading this admission      CHA2DS2Vasc is 2, on Xarelto, appropriately dosed  Secondary Discharge Diagnosis:  1. CM (presumed 2/2 AF) 2. HTN  No Known Allergies   Procedures This Admission:  1.  Tikosyn loading 2.  Direct current cardioversion on 03/01/18 by Dr Margaretann Loveless which was unsuccessful in restoring SR.  There were no early apparent complications.   Brief HPI: Daniel Romero is a 57 y.o. male with a past medical history as noted above.  He was referred to the AFib clinic in the outpatient setting for treatment options of atrial fibrillation.  Risks, benefits, and alternatives to Tikosyn were reviewed with the patient who wished to proceed.    Hospital Course:  The patient was admitted and Tikosyn was initiated.  Renal function and electrolytes were followed during the hospitalization.  His QTc remained stable.  On 03/01/18 he underwent direct current cardioversion.  Interestingly with anesthesia he converted to SR briefly, though back in AF quickly, was shocked x2 unable to restore SR.  It was decided to continue his Tikosyn, though despite 5th dose he remains in AFib this morning. Generally his rates fairly well controlled 80's-100's, though periods of RVR also observed to 130's.  Will ads coreg to his lisinopril.  Dr. Rayann Heman had a lengthy discussion with the patient regarding challenges of managing AFib, rate vs rhythm control strategy.  He has mild CM, and rhythm control strategy is favored for him.  Discussed amiodarone and ablation as next steps.  Dr. Rayann Heman discussed potential  risks/benefits of both, the patient would like to avoid procedures.  Baseline TSH, LFTs, and CXR obtained to follow up results at his Afib clinic visit.    On the day of discharge, the patient feels well, he was examined by Dr Rayann Heman who considered him stable for discharge to home.  Follow-up has been arranged with the AFib clinic.   HR currently at rest low 100, he is quite anxious and as we talk and  He relaxes rates into the 90's-100 even, with exertion 110-120, most recent BP is 126/87.  The patient feels well, will proceed with discharge.  Physical Exam: Vitals:   03/01/18 1340 03/01/18 1535 03/01/18 2034 03/02/18 1054  BP: 122/90 (!) 114/91 (!) 132/102 (!) 128/102  Pulse: 81 88 76   Resp: 11  18   Temp:  99.1 F (37.3 C)    TempSrc:  Oral    SpO2: 95% 97%    Weight:      Height:        GEN- The patient is well appearing, alert and oriented x 3 today.   HEENT: normocephalic, atraumatic; sclera clear, conjunctiva pink; hearing intact; oropharynx clear; neck supple, no JVP Lymph- no cervical lymphadenopathy Lungs- CTA b/l, normal work of breathing.  No wheezes, rales, rhonchi Heart- irreg-irreg, no murmurs, rubs or gallops, PMI not laterally displaced GI- soft, non-tender, non-distendedy Extremities- no clubbing, cyanosis, or edema MS- no significant deformity or atrophy Skin- warm and dry, no rash or lesion Psych- euthymic mood, full affect Neuro- strength and sensation are intact   Labs:   Lab Results  Component Value Date  WBC 8.4 10/13/2017   HGB 14.6 12/01/2017   HCT 43.0 12/01/2017   MCV 89 10/13/2017   PLT 305 10/13/2017    Recent Labs  Lab 03/02/18 0613 03/02/18 0956  NA 138  --   K 4.2  --   CL 106  --   CO2 22  --   BUN 10  --   CREATININE 0.83  --   CALCIUM 8.7*  --   PROT  --  7.2  BILITOT  --  0.8  ALKPHOS  --  53  ALT  --  28  AST  --  23  GLUCOSE 106*  --      Discharge Medications:  Allergies as of 03/02/2018   No Known Allergies      Medication List    TAKE these medications   amiodarone 200 MG tablet Commonly known as:  PACERONE Take 1 tablet (200 mg total) by mouth 2 (two) times daily. Notes to patient:  Do not start until Monday, 03/05/18   carvedilol 6.25 MG tablet Commonly known as:  COREG Take 1 tablet (6.25 mg total) by mouth 2 (two) times daily with a meal.   lisinopril-hydrochlorothiazide 10-12.5 MG tablet Commonly known as:  PRINZIDE,ZESTORETIC Take 1 tablet by mouth daily. Notes to patient:  Resume this medicine as prescibed   rivaroxaban 20 MG Tabs tablet Commonly known as:  XARELTO Take 1 tablet (20 mg total) by mouth daily with supper.       Disposition: Home Discharge Instructions    Diet - low sodium heart healthy   Complete by:  As directed    Increase activity slowly   Complete by:  As directed      Follow-up Information    [facility].   Specialty:  Cardiology Why:  03/16/18 @ 9:00AM Contact information: [address] [phone] [place] [phone]          Duration of Discharge Encounter: Greater than 30 minutes including physician time.  Signed, Tommye Standard, PA-C 03/02/2018 12:36 PM   I have seen, examined the patient, and reviewed the above assessment and plan.  Changes to above are made where necessary.  On exam, iRRR.  Failed medical therapy with tikosyn.  We have a lengthy discussion today about treatment options.  I have offered rate control, amiodarone, or ablation.  Risks and benefits to all three options were discussed at length.  At this time, he is very clear that he would prefer to try outpatient initiation of amiodarone.   Risks and benefits of Amiodarone including liver, lung, thyroid, occular toxicity and even death were discussed at length.  He accepts risks and wishes to proceed. Stop tikosyn Start amiodarone 200mg  BID on Monday.   Follow-up in AF clinic in 2 weeks. Plan  cardioversion in 4 weeks Will need close follow-up in the AF clinic.  Co Sign: Thompson Grayer, MD 03/02/2018 9:41 PM

## 2018-03-02 NOTE — Anesthesia Postprocedure Evaluation (Signed)
Anesthesia Post Note  Patient: Calem Cocozza  Procedure(s) Performed: CARDIOVERSION (N/A )     Patient location during evaluation: PACU Anesthesia Type: General Level of consciousness: awake and alert and awake Pain management: pain level controlled Vital Signs Assessment: post-procedure vital signs reviewed and stable Respiratory status: spontaneous breathing, nonlabored ventilation and respiratory function stable Cardiovascular status: blood pressure returned to baseline and stable Postop Assessment: no apparent nausea or vomiting Anesthetic complications: no    Last Vitals:  Vitals:   03/01/18 2034 03/02/18 1054  BP: (!) 132/102 (!) 128/102  Pulse: 76   Resp: 18   Temp:    SpO2:      Last Pain:  Vitals:   03/02/18 0800  TempSrc:   PainSc: 0-No pain                 Catalina Gravel

## 2018-03-02 NOTE — Progress Notes (Signed)
Pharmacy Consult for [place] Electrolyte Replacement  Pharmacy consulted to assist in monitoring and replacing electrolytes in this 57 y.o. male admitted on 02/27/2018 undergoing dofetilide initiation. First dofetilide dose: 500 mcg  Labs:    Component Value Date/Time   K 4.2 03/02/2018 0613   MG 2.0 03/02/2018 8727     Plan: Potassium: K >/= 4: No additional supplementation required   Magnesium: Mg >1.8: No additional supplementation needed   Patient received 40 mEq of potassium replacement once and potassium has been WNL since, do not recommend discharging patient with potassium supplementation.   Thank you for allowing pharmacy to participate in this patient's care   Claiborne Billings, PharmD PGY2 Cardiology Pharmacy Resident Phone [phone] Please check AMION for all Pharmacist numbers by unit 03/02/2018 7:32 AM

## 2018-03-03 ENCOUNTER — Encounter (HOSPITAL_COMMUNITY): Payer: Self-pay | Admitting: Internal Medicine

## 2018-03-16 ENCOUNTER — Ambulatory Visit (HOSPITAL_COMMUNITY): Payer: PRIVATE HEALTH INSURANCE | Admitting: Nurse Practitioner

## 2018-03-19 ENCOUNTER — Ambulatory Visit (HOSPITAL_COMMUNITY)
Admission: RE | Admit: 2018-03-19 | Discharge: 2018-03-19 | Disposition: A | Discharge status: Home / Self Care | Payer: 59 | Source: Ambulatory Visit | Attending: Nurse Practitioner | Admitting: Nurse Practitioner

## 2018-03-19 VITALS — BP 122/84 | HR 89 | Ht 70.0 in | Wt 231.0 lb

## 2018-03-19 DIAGNOSIS — Z8489 Family history of other specified conditions: Secondary | ICD-10-CM | POA: Insufficient documentation

## 2018-03-19 DIAGNOSIS — Z87891 Personal history of nicotine dependence: Secondary | ICD-10-CM | POA: Insufficient documentation

## 2018-03-19 DIAGNOSIS — Z7901 Long term (current) use of anticoagulants: Secondary | ICD-10-CM | POA: Diagnosis not present

## 2018-03-19 DIAGNOSIS — I119 Hypertensive heart disease without heart failure: Secondary | ICD-10-CM | POA: Insufficient documentation

## 2018-03-19 DIAGNOSIS — I4819 Other persistent atrial fibrillation: Secondary | ICD-10-CM | POA: Diagnosis not present

## 2018-03-19 DIAGNOSIS — Z79899 Other long term (current) drug therapy: Secondary | ICD-10-CM | POA: Diagnosis not present

## 2018-03-19 DIAGNOSIS — I4891 Unspecified atrial fibrillation: Secondary | ICD-10-CM | POA: Diagnosis present

## 2018-03-19 NOTE — H&P (View-Only) (Signed)
Primary Care Physician: Denita Lung, MD Referring Physician: Dr. Inez Catalina is a 56 y.o. male with a h/o HTN, asymptomatic, newly diagnosed afib in August of this year being referred to Dr. Gwenlyn Found at that time. He was cardioverted,12/01/17, after sufficient time of being anticoagulanted. This was successful but he was back in afib on f/u with Dr. Gwenlyn Found.Echo showed an EF of 40-45%. It was discussed with pt even thought he is asymptomatic his reduced EF may be 2/2 to [facility] and it would be beneficial to try to restore SR going forward. He currently does not drink or smoke. Mod caffeine use. Denies a snoring history.   F/u in afib clinic, 12/10. He is here for Tikosyn admit. He stopped lisinopril/hctz last Thursday. He is aware of cost of drug and can afford.   F/u 12/30. Unfortunately, pt failed Tikosyn to restore SR  and this was stopped and he was loaded on Amiodarone. He is now on amiodarone  X 2 weeks, continues in rate controlled afib, and hopefully this will be a bridge to ablation down the road.   Today, he denies symptoms of palpitations, chest pain, shortness of breath, orthopnea, PND, lower extremity edema, dizziness, presyncope, syncope, or neurologic sequela. The patient is tolerating medications without difficulties and is otherwise without complaint today.   Past Medical History:  Diagnosis Date  . Chronic systolic dysfunction of left ventricle   . Depression   . Hypertension   . Obesity   . Persistent atrial fibrillation   . Seasonal allergies    Past Surgical History:  Procedure Laterality Date  . CARDIOVERSION N/A 12/01/2017   Procedure: CARDIOVERSION;  Surgeon: Larey Dresser, MD;  Location: [facility] ENDOSCOPY;  Service: Cardiovascular;  Laterality: N/A;  . CARDIOVERSION N/A 03/01/2018   Procedure: CARDIOVERSION;  Surgeon: Elouise Munroe, MD;  Location: [place];  Service: Cardiovascular;  Laterality: N/A;  . COLONOSCOPY  ~ 2015  . WISDOM TOOTH EXTRACTION        Current Outpatient Medications  Medication Sig Dispense Refill  . amiodarone (PACERONE) 200 MG tablet Take 1 tablet (200 mg total) by mouth 2 (two) times daily. 60 tablet 3  . carvedilol (COREG) 6.25 MG tablet Take 1 tablet (6.25 mg total) by mouth 2 (two) times daily with a meal. 60 tablet 6  . lisinopril-hydrochlorothiazide (PRINZIDE,ZESTORETIC) 10-12.5 MG tablet Take 1 tablet by mouth daily.    . rivaroxaban (XARELTO) 20 MG TABS tablet Take 1 tablet (20 mg total) by mouth daily with supper. 90 tablet 3   No current facility-administered medications for this encounter.     No Known Allergies  Social History   Socioeconomic History  . Marital status: Single    Spouse name: Not on file  . Number of children: Not on file  . Years of education: Not on file  . Highest education level: Not on file  Occupational History  . Not on file  Social Needs  . Financial resource strain: Not on file  . Food insecurity:    Worry: Not on file    Inability: Not on file  . Transportation needs:    Medical: Not on file    Non-medical: Not on file  Tobacco Use  . Smoking status: Former Smoker    Packs/day: 0.75    Years: 5.00    Pack years: 3.75    Types: Cigarettes    Last attempt to quit: 05/20/2013    Years since quitting: 4.8  . Smokeless tobacco:  Never Used  Substance and Sexual Activity  . Alcohol use: Not Currently  . Drug use: Not Currently  . Sexual activity: Yes  Lifestyle  . Physical activity:    Days per week: Not on file    Minutes per session: Not on file  . Stress: Not on file  Relationships  . Social connections:    Talks on phone: Not on file    Gets together: Not on file    Attends religious service: Not on file    Active member of club or organization: Not on file    Attends meetings of clubs or organizations: Not on file    Relationship status: Not on file  . Intimate partner violence:    Fear of current or ex partner: Not on file    Emotionally abused:  Not on file    Physically abused: Not on file    Forced sexual activity: Not on file  Other Topics Concern  . Not on file  Social History Narrative  . Not on file    Family History  Problem Relation Age of Onset  . Stroke Father   . Heart disease Father   . Colon cancer Neg Hx   . Esophageal cancer Neg Hx   . Stomach cancer Neg Hx   . Rectal cancer Neg Hx     ROS- All systems are reviewed and negative except as per the HPI above  Physical Exam: Vitals:   03/19/18 1522  BP: 122/84  Pulse: 89  Weight: 104.8 kg  Height: 5\' 10"  (1.778 m)   Wt Readings from Last 3 Encounters:  03/19/18 104.8 kg  03/01/18 100.5 kg  02/27/18 103.9 kg    Labs: Lab Results  Component Value Date   NA 138 03/02/2018   K 4.2 03/02/2018   CL 106 03/02/2018   CO2 22 03/02/2018   GLUCOSE 106 (H) 03/02/2018   BUN 10 03/02/2018   CREATININE 0.83 03/02/2018   CALCIUM 8.7 (L) 03/02/2018   MG 2.0 03/02/2018   No results found for: INR Lab Results  Component Value Date   CHOL 193 10/13/2017   HDL 58 10/13/2017   LDLCALC 112 (H) 10/13/2017   TRIG 114 10/13/2017     GEN- The patient is well appearing, alert and oriented x 3 today.   Head- normocephalic, atraumatic Eyes-  Sclera clear, conjunctiva pink Ears- hearing intact Oropharynx- clear Neck- supple, no JVP Lymph- no cervical lymphadenopathy Lungs- Clear to ausculation bilaterally, normal work of breathing Heart- irregular rate and rhythm, no murmurs, rubs or gallops, PMI not laterally displaced GI- soft, NT, ND, + BS Extremities- no clubbing, cyanosis, or edema MS- no significant deformity or atrophy Skin- no rash or lesion Psych- euthymic mood, full affect Neuro- strength and sensation are intact  EKG- afib at 89  bpm, qrs int 92 ms, qtc 462 ms   Echo-Study Conclusions  - Left ventricle: The cavity size was normal. Wall thickness was   increased in a pattern of mild LVH. Systolic function was mildly   to moderately  reduced. The estimated ejection fraction was in the   range of 40% to 45%. Diffuse hypokinesis. There was no evidence   of elevated ventricular filling pressure by Doppler parameters. - Left atrium: The atrium was mildly dilated. Volume/bsa, ES,   (1-plane Simpson&'s, A2C): 35.3 ml/m^2. Left atrium 41 mm  Assessment and Plan: 1. Persistent  afib Failed Tikosyn for failing to restore SR  Seems to be present since this past  summer Now loading on amiodarone 200 mg bid He is on xarelto20 mg for a chadsvasc score of 2(htn,lv dysfunction) states no missed anticoagulation   will schedule for cardioversion 1/17 Will have labs /ekg am of cardioversion He is not symptomatic but would be best to restore SR for his LV dysfunction  He hopefully will be an ablation candidate several months after obtaining SR as we would not  like to continue amio long term for the potential side effects   F/u here one week after cardioversion He will continue amio at 200 mg bid until seen one week after cardioversion  Butch Penny C. Percell Lamboy, [facility] [address] [place], [place] [address] [phone]

## 2018-03-19 NOTE — Progress Notes (Signed)
Primary Care Physician: Denita Lung, MD Referring Physician: Dr. Inez Catalina is a 57 y.o. male with a h/o HTN, asymptomatic, newly diagnosed afib in August of this year being referred to Dr. Gwenlyn Found at that time. He was cardioverted,12/01/17, after sufficient time of being anticoagulanted. This was successful but he was back in afib on f/u with Dr. Gwenlyn Found.Echo showed an EF of 40-45%. It was discussed with pt even thought he is asymptomatic his reduced EF may be 2/2 to [facility] and it would be beneficial to try to restore SR going forward. He currently does not drink or smoke. Mod caffeine use. Denies a snoring history.   F/u in afib clinic, 12/10. He is here for Tikosyn admit. He stopped lisinopril/hctz last Thursday. He is aware of cost of drug and can afford.   F/u 12/30. Unfortunately, pt failed Tikosyn to restore SR  and this was stopped and he was loaded on Amiodarone. He is now on amiodarone  X 2 weeks, continues in rate controlled afib, and hopefully this will be a bridge to ablation down the road.   Today, he denies symptoms of palpitations, chest pain, shortness of breath, orthopnea, PND, lower extremity edema, dizziness, presyncope, syncope, or neurologic sequela. The patient is tolerating medications without difficulties and is otherwise without complaint today.   Past Medical History:  Diagnosis Date  . Chronic systolic dysfunction of left ventricle   . Depression   . Hypertension   . Obesity   . Persistent atrial fibrillation   . Seasonal allergies    Past Surgical History:  Procedure Laterality Date  . CARDIOVERSION N/A 12/01/2017   Procedure: CARDIOVERSION;  Surgeon: Larey Dresser, MD;  Location: [facility] ENDOSCOPY;  Service: Cardiovascular;  Laterality: N/A;  . CARDIOVERSION N/A 03/01/2018   Procedure: CARDIOVERSION;  Surgeon: Elouise Munroe, MD;  Location: [place];  Service: Cardiovascular;  Laterality: N/A;  . COLONOSCOPY  ~ 2015  . WISDOM TOOTH EXTRACTION        Current Outpatient Medications  Medication Sig Dispense Refill  . amiodarone (PACERONE) 200 MG tablet Take 1 tablet (200 mg total) by mouth 2 (two) times daily. 60 tablet 3  . carvedilol (COREG) 6.25 MG tablet Take 1 tablet (6.25 mg total) by mouth 2 (two) times daily with a meal. 60 tablet 6  . lisinopril-hydrochlorothiazide (PRINZIDE,ZESTORETIC) 10-12.5 MG tablet Take 1 tablet by mouth daily.    . rivaroxaban (XARELTO) 20 MG TABS tablet Take 1 tablet (20 mg total) by mouth daily with supper. 90 tablet 3   No current facility-administered medications for this encounter.     No Known Allergies  Social History   Socioeconomic History  . Marital status: Single    Spouse name: Not on file  . Number of children: Not on file  . Years of education: Not on file  . Highest education level: Not on file  Occupational History  . Not on file  Social Needs  . Financial resource strain: Not on file  . Food insecurity:    Worry: Not on file    Inability: Not on file  . Transportation needs:    Medical: Not on file    Non-medical: Not on file  Tobacco Use  . Smoking status: Former Smoker    Packs/day: 0.75    Years: 5.00    Pack years: 3.75    Types: Cigarettes    Last attempt to quit: 05/20/2013    Years since quitting: 4.8  . Smokeless tobacco:  Never Used  Substance and Sexual Activity  . Alcohol use: Not Currently  . Drug use: Not Currently  . Sexual activity: Yes  Lifestyle  . Physical activity:    Days per week: Not on file    Minutes per session: Not on file  . Stress: Not on file  Relationships  . Social connections:    Talks on phone: Not on file    Gets together: Not on file    Attends religious service: Not on file    Active member of club or organization: Not on file    Attends meetings of clubs or organizations: Not on file    Relationship status: Not on file  . Intimate partner violence:    Fear of current or ex partner: Not on file    Emotionally abused:  Not on file    Physically abused: Not on file    Forced sexual activity: Not on file  Other Topics Concern  . Not on file  Social History Narrative  . Not on file    Family History  Problem Relation Age of Onset  . Stroke Father   . Heart disease Father   . Colon cancer Neg Hx   . Esophageal cancer Neg Hx   . Stomach cancer Neg Hx   . Rectal cancer Neg Hx     ROS- All systems are reviewed and negative except as per the HPI above  Physical Exam: Vitals:   03/19/18 1522  BP: 122/84  Pulse: 89  Weight: 104.8 kg  Height: 5\' 10"  (1.778 m)   Wt Readings from Last 3 Encounters:  03/19/18 104.8 kg  03/01/18 100.5 kg  02/27/18 103.9 kg    Labs: Lab Results  Component Value Date   NA 138 03/02/2018   K 4.2 03/02/2018   CL 106 03/02/2018   CO2 22 03/02/2018   GLUCOSE 106 (H) 03/02/2018   BUN 10 03/02/2018   CREATININE 0.83 03/02/2018   CALCIUM 8.7 (L) 03/02/2018   MG 2.0 03/02/2018   No results found for: INR Lab Results  Component Value Date   CHOL 193 10/13/2017   HDL 58 10/13/2017   LDLCALC 112 (H) 10/13/2017   TRIG 114 10/13/2017     GEN- The patient is well appearing, alert and oriented x 3 today.   Head- normocephalic, atraumatic Eyes-  Sclera clear, conjunctiva pink Ears- hearing intact Oropharynx- clear Neck- supple, no JVP Lymph- no cervical lymphadenopathy Lungs- Clear to ausculation bilaterally, normal work of breathing Heart- irregular rate and rhythm, no murmurs, rubs or gallops, PMI not laterally displaced GI- soft, NT, ND, + BS Extremities- no clubbing, cyanosis, or edema MS- no significant deformity or atrophy Skin- no rash or lesion Psych- euthymic mood, full affect Neuro- strength and sensation are intact  EKG- afib at 89  bpm, qrs int 92 ms, qtc 462 ms   Echo-Study Conclusions  - Left ventricle: The cavity size was normal. Wall thickness was   increased in a pattern of mild LVH. Systolic function was mildly   to moderately  reduced. The estimated ejection fraction was in the   range of 40% to 45%. Diffuse hypokinesis. There was no evidence   of elevated ventricular filling pressure by Doppler parameters. - Left atrium: The atrium was mildly dilated. Volume/bsa, ES,   (1-plane Simpson&'s, A2C): 35.3 ml/m^2. Left atrium 41 mm  Assessment and Plan: 1. Persistent  afib Failed Tikosyn for failing to restore SR  Seems to be present since this past  summer Now loading on amiodarone 200 mg bid He is on xarelto20 mg for a chadsvasc score of 2(htn,lv dysfunction) states no missed anticoagulation   will schedule for cardioversion 1/17 Will have labs /ekg am of cardioversion He is not symptomatic but would be best to restore SR for his LV dysfunction  He hopefully will be an ablation candidate several months after obtaining SR as we would not  like to continue amio long term for the potential side effects   F/u here one week after cardioversion He will continue amio at 200 mg bid until seen one week after cardioversion  Butch Penny C. Carroll, [facility] [address] [place], [place] [address] [phone]

## 2018-03-19 NOTE — Patient Instructions (Signed)
Cardioversion scheduled for Friday, January 17th  -Come to Afib Clinic at [place] at the [facility] [facility] and go to admitting at [facility] not eat or drink anything after midnight the night prior to your procedure.  - Take all your morning medication with a sip of water prior to arrival.  - You will not be able to drive home after your procedure.

## 2018-03-29 DIAGNOSIS — L821 Other seborrheic keratosis: Secondary | ICD-10-CM | POA: Diagnosis not present

## 2018-03-29 DIAGNOSIS — D045 Carcinoma in situ of skin of trunk: Secondary | ICD-10-CM | POA: Diagnosis not present

## 2018-03-29 DIAGNOSIS — D225 Melanocytic nevi of trunk: Secondary | ICD-10-CM | POA: Diagnosis not present

## 2018-03-29 DIAGNOSIS — D1801 Hemangioma of skin and subcutaneous tissue: Secondary | ICD-10-CM | POA: Diagnosis not present

## 2018-03-30 HISTORY — PX: SKIN BIOPSY: SHX1

## 2018-04-06 ENCOUNTER — Other Ambulatory Visit: Payer: Self-pay

## 2018-04-06 ENCOUNTER — Ambulatory Visit (HOSPITAL_COMMUNITY)
Admission: RE | Admit: 2018-04-06 | Discharge: 2018-04-06 | Disposition: A | Discharge status: Home / Self Care | Payer: 59 | Source: Ambulatory Visit | Attending: Nurse Practitioner | Admitting: Nurse Practitioner

## 2018-04-06 ENCOUNTER — Encounter (HOSPITAL_COMMUNITY): Payer: Self-pay | Admitting: Certified Registered Nurse Anesthetist

## 2018-04-06 ENCOUNTER — Encounter (HOSPITAL_COMMUNITY)
Admission: RE | Disposition: A | Discharge status: Home / Self Care | Payer: Self-pay | Source: Home / Self Care | Attending: Cardiovascular Disease

## 2018-04-06 ENCOUNTER — Ambulatory Visit (HOSPITAL_COMMUNITY): Payer: 59 | Admitting: Anesthesiology

## 2018-04-06 ENCOUNTER — Ambulatory Visit (HOSPITAL_COMMUNITY)
Admission: RE | Admit: 2018-04-06 | Discharge: 2018-04-06 | Disposition: A | Discharge status: Home / Self Care | Payer: 59 | Attending: Cardiovascular Disease | Admitting: Cardiovascular Disease

## 2018-04-06 DIAGNOSIS — Z6832 Body mass index (BMI) 32.0-32.9, adult: Secondary | ICD-10-CM | POA: Insufficient documentation

## 2018-04-06 DIAGNOSIS — Z8249 Family history of ischemic heart disease and other diseases of the circulatory system: Secondary | ICD-10-CM | POA: Insufficient documentation

## 2018-04-06 DIAGNOSIS — Z823 Family history of stroke: Secondary | ICD-10-CM | POA: Insufficient documentation

## 2018-04-06 DIAGNOSIS — I5022 Chronic systolic (congestive) heart failure: Secondary | ICD-10-CM | POA: Diagnosis not present

## 2018-04-06 DIAGNOSIS — I509 Heart failure, unspecified: Secondary | ICD-10-CM | POA: Diagnosis not present

## 2018-04-06 DIAGNOSIS — I110 Hypertensive heart disease with heart failure: Secondary | ICD-10-CM | POA: Diagnosis not present

## 2018-04-06 DIAGNOSIS — E669 Obesity, unspecified: Secondary | ICD-10-CM | POA: Diagnosis not present

## 2018-04-06 DIAGNOSIS — Z87891 Personal history of nicotine dependence: Secondary | ICD-10-CM | POA: Diagnosis not present

## 2018-04-06 DIAGNOSIS — I4891 Unspecified atrial fibrillation: Secondary | ICD-10-CM | POA: Diagnosis not present

## 2018-04-06 DIAGNOSIS — Z7901 Long term (current) use of anticoagulants: Secondary | ICD-10-CM | POA: Diagnosis not present

## 2018-04-06 DIAGNOSIS — I4819 Other persistent atrial fibrillation: Secondary | ICD-10-CM | POA: Diagnosis not present

## 2018-04-06 DIAGNOSIS — Z79899 Other long term (current) drug therapy: Secondary | ICD-10-CM | POA: Diagnosis not present

## 2018-04-06 HISTORY — PX: CARDIOVERSION: SHX1299

## 2018-04-06 LAB — CBC
HCT: 49.7 % (ref 39.0–52.0)
Hemoglobin: 16.6 g/dL (ref 13.0–17.0)
MCH: 30.1 pg (ref 26.0–34.0)
MCHC: 33.4 g/dL (ref 30.0–36.0)
MCV: 90.2 fL (ref 80.0–100.0)
Platelets: 316 10*3/uL (ref 150–400)
RBC: 5.51 MIL/uL (ref 4.22–5.81)
RDW: 12.8 % (ref 11.5–15.5)
WBC: 6.8 10*3/uL (ref 4.0–10.5)
nRBC: 0 % (ref 0.0–0.2)

## 2018-04-06 LAB — BASIC METABOLIC PANEL
Anion gap: 10 (ref 5–15)
BUN: 14 mg/dL (ref 6–20)
CALCIUM: 8.7 mg/dL — AB (ref 8.9–10.3)
CO2: 23 mmol/L (ref 22–32)
Chloride: 104 mmol/L (ref 98–111)
Creatinine, Ser: 1.21 mg/dL (ref 0.61–1.24)
GFR calc Af Amer: >60 mL/min (ref >60)
Glucose, Bld: 103 mg/dL — ABNORMAL HIGH (ref 70–99)
Potassium: 5.3 mmol/L — ABNORMAL HIGH (ref 3.5–5.1)
Sodium: 137 mmol/L (ref 135–145)

## 2018-04-06 SURGERY — CARDIOVERSION
Anesthesia: General

## 2018-04-06 MED ORDER — PROPOFOL 10 MG/ML IV BOLUS
INTRAVENOUS | Status: DC | PRN
  Administered 2018-04-06: 90 mg via INTRAVENOUS
  Administered 2018-04-06: 60 mg via INTRAVENOUS

## 2018-04-06 MED ORDER — SODIUM CHLORIDE 0.9 % IV SOLN
INTRAVENOUS | Status: DC | PRN
  Administered 2018-04-06: 10:00:00 via INTRAVENOUS

## 2018-04-06 MED ORDER — LIDOCAINE 2% (20 MG/ML) 5 ML SYRINGE
INTRAMUSCULAR | Status: DC | PRN
  Administered 2018-04-06: 100 mg via INTRAVENOUS

## 2018-04-06 NOTE — Anesthesia Preprocedure Evaluation (Addendum)
Anesthesia Evaluation  Patient identified by MRN, date of birth, ID band Patient awake    Reviewed: Allergy & Precautions, NPO status , Patient's Chart, lab work & pertinent test results  Airway Mallampati: I   Neck ROM: Full    Dental  (+) Teeth Intact, Dental Advisory Given   Pulmonary former smoker,    breath sounds clear to auscultation       Cardiovascular hypertension, Pt. on home beta blockers and Pt. on medications +CHF  + dysrhythmias Atrial Fibrillation  Rhythm:Irregular Rate:Normal  TTE 10/2017 EF 40-45%, no valvular abnormalties   Neuro/Psych PSYCHIATRIC DISORDERS Depression negative neurological ROS     GI/Hepatic negative GI ROS, Neg liver ROS,   Endo/Other  negative endocrine ROS  Renal/GU negative Renal ROS  negative genitourinary   Musculoskeletal negative musculoskeletal ROS (+)   Abdominal   Peds  Hematology negative hematology ROS (+)   Anesthesia Other Findings On xarelto  Reproductive/Obstetrics                           Anesthesia Physical Anesthesia Plan  ASA: III  Anesthesia Plan: General   Post-op Pain Management:    Induction: Intravenous  PONV Risk Score and Plan: 2 and Propofol infusion and Treatment may vary due to age or medical condition  Airway Management Planned: Natural Airway  Additional Equipment:   Intra-op Plan:   Post-operative Plan:   Informed Consent: I have reviewed the patients History and Physical, chart, labs and discussed the procedure including the risks, benefits and alternatives for the proposed anesthesia with the patient or authorized representative who has indicated his/her understanding and acceptance.     Dental advisory given  Plan Discussed with: CRNA  Anesthesia Plan Comments:        Anesthesia Quick Evaluation

## 2018-04-06 NOTE — Progress Notes (Addendum)
Patient in for ekg confirmed afib in the 70s. Labs drawn stat - sent to admissions for cardioversion as scheduled.   Pt has been loading on amiodarone x one month after failing tikosyn load. He is scheduled for DCCV this am.He is anticoagulated appropriately. Labs drawn. EKG shows afib at 70 bpm. He will be pursing ablation in a few months. Has f/u here in one week. Continue amiodarone 200 mg bid until then and will be lowered to 200 mg daily then.

## 2018-04-06 NOTE — Discharge Instructions (Signed)
Electrical Cardioversion, Care After °This sheet gives you information about how to care for yourself after your procedure. Your health care provider may also give you more specific instructions. If you have problems or questions, contact your health care provider. °What can I expect after the procedure? °After the procedure, it is common to have: °· Some redness on the skin where the shocks were given. °Follow these instructions at home: ° °· Do not drive for 24 hours if you were given a medicine to help you relax (sedative). °· Take over-the-counter and prescription medicines only as told by your health care provider. °· Ask your health care provider how to check your pulse. Check it often. °· Rest for 48 hours after the procedure or as told by your health care provider. °· Avoid or limit your caffeine use as told by your health care provider. °Contact a health care provider if: °· You feel like your heart is beating too quickly or your pulse is not regular. °· You have a serious muscle cramp that does not go away. °Get help right away if: ° °· You have discomfort in your chest. °· You are dizzy or you feel faint. °· You have trouble breathing or you are short of breath. °· Your speech is slurred. °· You have trouble moving an arm or leg on one side of your body. °· Your fingers or toes turn cold or blue. °This information is not intended to replace advice given to you by your health care provider. Make sure you discuss any questions you have with your health care provider. °Document Released: 12/26/2012 Document Revised: 10/09/2015 Document Reviewed: 09/11/2015 °Elsevier Interactive Patient Education © 2019 [employer] ° °

## 2018-04-06 NOTE — Anesthesia Postprocedure Evaluation (Signed)
Anesthesia Post Note  Patient: Daniel Romero  Procedure(s) Performed: CARDIOVERSION (N/A )     Patient location during evaluation: PACU Anesthesia Type: General Level of consciousness: awake and alert Pain management: pain level controlled Vital Signs Assessment: post-procedure vital signs reviewed and stable Respiratory status: spontaneous breathing, nonlabored ventilation, respiratory function stable and patient connected to nasal cannula oxygen Cardiovascular status: blood pressure returned to baseline and stable Postop Assessment: no apparent nausea or vomiting Anesthetic complications: no    Last Vitals:  Vitals:   04/06/18 1050 04/06/18 1100  BP: 103/75 107/78  Pulse: (!) 56 (!) 53  Resp: 13 16  Temp:    SpO2: 99% 98%    Last Pain:  Vitals:   04/06/18 1100  TempSrc:   PainSc: 0-No pain                 [name]

## 2018-04-06 NOTE — Transfer of Care (Signed)
Immediate Anesthesia Transfer of Care Note  Patient: Daniel Romero  Procedure(s) Performed: CARDIOVERSION (N/A )  Patient Location: Endoscopy Unit  Anesthesia Type:General  Level of Consciousness: awake, alert  and oriented  Airway & Oxygen Therapy: Patient Spontanous Breathing  Post-op Assessment: Report given to RN and Post -op Vital signs reviewed and stable  Post vital signs: Reviewed and stable  Last Vitals:  Vitals Value Taken Time  BP 108/65 04/06/2018 10:39 AM  Temp    Pulse 68 04/06/2018 10:39 AM  Resp 17 04/06/2018 10:39 AM  SpO2 98 % 04/06/2018 10:39 AM    Last Pain:  Vitals:   04/06/18 0934  TempSrc: Oral  PainSc: 0-No pain         Complications: No apparent anesthesia complications

## 2018-04-06 NOTE — CV Procedure (Signed)
    Cardioversion Note  Daniel Romero [id number] 1960-09-16  Procedure: DC Cardioversion Indications: atrial fib   Procedure Details Consent: Obtained Time Out: Verified patient identification, verified procedure, site/side was marked, verified correct patient position, special equipment/implants available, Radiology Safety Procedures followed,  medications/allergies/relevent history reviewed, required imaging and test results available.  Performed  The patient has been on adequate anticoagulation.  The patient received IV Lidocaine 100 mg followed by Propofol 150 mg  for sedation.  Synchronous cardioversion was performed at 200, 200  joules.  The cardioversion was successful     Complications: No apparent complications Patient did tolerate procedure well.   Thayer Headings, Brooke Bonito., MD, [facility] 04/06/2018, 10:40 AM

## 2018-04-06 NOTE — Anesthesia Procedure Notes (Signed)
Procedure Name: General with mask airway Date/Time: 04/06/2018 10:21 AM Performed by: Candis Shine, CRNA Pre-anesthesia Checklist: Patient identified, Emergency Drugs available, Suction available, Patient being monitored and Timeout performed Patient Re-evaluated:Patient Re-evaluated prior to induction Oxygen Delivery Method: Ambu bag Preoxygenation: Pre-oxygenation with 100% oxygen Induction Type: IV induction Dental Injury: Teeth and Oropharynx as per pre-operative assessment

## 2018-04-06 NOTE — Interval H&P Note (Signed)
History and Physical Interval Note:  04/06/2018 9:43 AM  Daniel Romero Daniel Romero  has presented today for surgery, with the diagnosis of AFIB  The various methods of treatment have been discussed with the patient and family. After consideration of risks, benefits and other options for treatment, the patient has consented to  Procedure(s): CARDIOVERSION (N/A) as a surgical intervention .  The patient's history has been reviewed, patient examined, no change in status, stable for surgery.  I have reviewed the patient's chart and labs.  Questions were answered to the patient's satisfaction.     Mertie Moores

## 2018-04-09 ENCOUNTER — Encounter (HOSPITAL_COMMUNITY): Payer: Self-pay | Admitting: Cardiovascular Disease

## 2018-04-09 ENCOUNTER — Encounter: Payer: Self-pay | Admitting: Family Medicine

## 2018-04-12 ENCOUNTER — Ambulatory Visit (HOSPITAL_COMMUNITY)
Admission: RE | Admit: 2018-04-12 | Discharge: 2018-04-12 | Disposition: A | Discharge status: Home / Self Care | Payer: 59 | Source: Ambulatory Visit | Attending: Nurse Practitioner | Admitting: Nurse Practitioner

## 2018-04-12 ENCOUNTER — Encounter (HOSPITAL_COMMUNITY): Payer: Self-pay | Admitting: Nurse Practitioner

## 2018-04-12 VITALS — BP 140/76 | HR 75 | Ht 70.0 in | Wt 229.0 lb

## 2018-04-12 DIAGNOSIS — I5022 Chronic systolic (congestive) heart failure: Secondary | ICD-10-CM | POA: Diagnosis not present

## 2018-04-12 DIAGNOSIS — Z79899 Other long term (current) drug therapy: Secondary | ICD-10-CM | POA: Insufficient documentation

## 2018-04-12 DIAGNOSIS — Z8249 Family history of ischemic heart disease and other diseases of the circulatory system: Secondary | ICD-10-CM | POA: Diagnosis not present

## 2018-04-12 DIAGNOSIS — I4819 Other persistent atrial fibrillation: Secondary | ICD-10-CM | POA: Insufficient documentation

## 2018-04-12 DIAGNOSIS — Z87891 Personal history of nicotine dependence: Secondary | ICD-10-CM | POA: Insufficient documentation

## 2018-04-12 DIAGNOSIS — E669 Obesity, unspecified: Secondary | ICD-10-CM | POA: Diagnosis not present

## 2018-04-12 DIAGNOSIS — I110 Hypertensive heart disease with heart failure: Secondary | ICD-10-CM | POA: Diagnosis not present

## 2018-04-12 DIAGNOSIS — I429 Cardiomyopathy, unspecified: Secondary | ICD-10-CM | POA: Insufficient documentation

## 2018-04-12 DIAGNOSIS — Z6832 Body mass index (BMI) 32.0-32.9, adult: Secondary | ICD-10-CM | POA: Diagnosis not present

## 2018-04-12 DIAGNOSIS — Z7901 Long term (current) use of anticoagulants: Secondary | ICD-10-CM | POA: Diagnosis not present

## 2018-04-12 NOTE — Progress Notes (Signed)
Primary Care Physician: Denita Lung, MD Referring Physician: Dr. Val Riles Daniel Romero is a 58 y.o. male with a h/o HTN, asymptomatic, newly diagnosed afib in August of this year being referred to Dr. Gwenlyn Found at that time. He was cardioverted,12/01/17, after sufficient time of being anticoagulanted. This was successful but he was back in afib on f/u with Dr. Gwenlyn Found.Echo showed an EF of 40-45%. It was discussed with pt even thought he is asymptomatic his reduced EF may be 2/2 to [facility] and it would be beneficial to try to restore SR going forward. He currently does not drink or smoke. Mod caffeine use. Denies a snoring history.   F/u in afib clinic, 12/10. He is here for Tikosyn admit. He stopped lisinopril/hctz last Thursday. He is aware of cost of drug and can afford.   F/u 12/30. Unfortunately, pt failed Tikosyn to restore SR  and this was stopped and he was loaded on Amiodarone. He is now on amiodarone  X 2 weeks, continues in rate controlled afib, and hopefully this will be a bridge to ablation down the road.   F/u afib clinic 04/12/18. S/p DCCV 04/06/18 which was initially successful but he reports early return to afib the next morning. He did notice a mild improvement in his overall wellbeing when he was in normal rhythm. He has continued on amiodarone 200 mg BID and Xarelto 20 mg daily with no missed doses.  Today, he denies symptoms of palpitations, chest pain, shortness of breath, orthopnea, PND, lower extremity edema, dizziness, presyncope, syncope, or neurologic sequela. The patient is tolerating medications without difficulties and is otherwise without complaint today.   Past Medical History:  Diagnosis Date  . Chronic systolic dysfunction of left ventricle   . Depression   . Hypertension   . Obesity   . Persistent atrial fibrillation   . Seasonal allergies    Past Surgical History:  Procedure Laterality Date  . CARDIOVERSION N/A 12/01/2017   Procedure: CARDIOVERSION;   Surgeon: Larey Dresser, MD;  Location: [facility] ENDOSCOPY;  Service: Cardiovascular;  Laterality: N/A;  . CARDIOVERSION N/A 03/01/2018   Procedure: CARDIOVERSION;  Surgeon: Elouise Munroe, MD;  Location: [facility] ENDOSCOPY;  Service: Cardiovascular;  Laterality: N/A;  . CARDIOVERSION N/A 04/06/2018   Procedure: CARDIOVERSION;  Surgeon: Thayer Headings, MD;  Location: [place];  Service: Cardiovascular;  Laterality: N/A;  . COLONOSCOPY  ~ 2015  . SKIN BIOPSY Left 03/30/2018   squamous cell carcinoma in situ  . WISDOM TOOTH EXTRACTION      Current Outpatient Medications  Medication Sig Dispense Refill  . amiodarone (PACERONE) 200 MG tablet Take 1 tablet (200 mg total) by mouth 2 (two) times daily. 60 tablet 3  . carvedilol (COREG) 6.25 MG tablet Take 1 tablet (6.25 mg total) by mouth 2 (two) times daily with a meal. 60 tablet 6  . lisinopril-hydrochlorothiazide (PRINZIDE,ZESTORETIC) 10-12.5 MG tablet Take 1 tablet by mouth daily.    . rivaroxaban (XARELTO) 20 MG TABS tablet Take 1 tablet (20 mg total) by mouth daily with supper. 90 tablet 3   No current facility-administered medications for this encounter.     No Known Allergies  Social History   Socioeconomic History  . Marital status: Single    Spouse name: Not on file  . Number of children: Not on file  . Years of education: Not on file  . Highest education level: Not on file  Occupational History  . Not on file  Social Needs  .  Financial resource strain: Not on file  . Food insecurity:    Worry: Not on file    Inability: Not on file  . Transportation needs:    Medical: Not on file    Non-medical: Not on file  Tobacco Use  . Smoking status: Former Smoker    Packs/day: 0.75    Years: 5.00    Pack years: 3.75    Types: Cigarettes    Last attempt to quit: 05/20/2013    Years since quitting: 4.8  . Smokeless tobacco: Never Used  Substance and Sexual Activity  . Alcohol use: Not Currently  . Drug use: Not Currently  .  Sexual activity: Yes  Lifestyle  . Physical activity:    Days per week: Not on file    Minutes per session: Not on file  . Stress: Not on file  Relationships  . Social connections:    Talks on phone: Not on file    Gets together: Not on file    Attends religious service: Not on file    Active member of club or organization: Not on file    Attends meetings of clubs or organizations: Not on file    Relationship status: Not on file  . Intimate partner violence:    Fear of current or ex partner: Not on file    Emotionally abused: Not on file    Physically abused: Not on file    Forced sexual activity: Not on file  Other Topics Concern  . Not on file  Social History Narrative  . Not on file    Family History  Problem Relation Age of Onset  . Stroke Father   . Heart disease Father   . Colon cancer Neg Hx   . Esophageal cancer Neg Hx   . Stomach cancer Neg Hx   . Rectal cancer Neg Hx     ROS- All systems are reviewed and negative except as per the HPI above  Physical Exam: Vitals:   04/12/18 1458  BP: 140/76  Pulse: 75  Weight: 103.9 kg  Height: 5\' 10"  (1.778 m)   Wt Readings from Last 3 Encounters:  04/12/18 103.9 kg  04/06/18 101.2 kg  03/19/18 104.8 kg    Labs: Lab Results  Component Value Date   NA 137 04/06/2018   K 5.3 (H) 04/06/2018   CL 104 04/06/2018   CO2 23 04/06/2018   GLUCOSE 103 (H) 04/06/2018   BUN 14 04/06/2018   CREATININE 1.21 04/06/2018   CALCIUM 8.7 (L) 04/06/2018   MG 2.0 03/02/2018   No results found for: INR Lab Results  Component Value Date   CHOL 193 10/13/2017   HDL 58 10/13/2017   LDLCALC 112 (H) 10/13/2017   TRIG 114 10/13/2017     GEN- The patient is well appearing, alert and oriented x 3 today.   HEENT-head normocephalic, atraumatic, sclera clear, conjunctiva pink, hearing intact, trachea midline. Lungs- Clear to ausculation bilaterally, normal work of breathing Heart- irregular rate and rhythm, no murmurs, rubs or  gallops  GI- soft, NT, ND, + BS Extremities- no clubbing, cyanosis, or edema MS- no significant deformity or atrophy Skin- no rash or lesion Psych- euthymic mood, full affect Neuro- strength and sensation are intact   EKG- atrial fibrillation HR 75, QRS 94, QTc 462 Echo-Study Conclusions  - Left ventricle: The cavity size was normal. Wall thickness was   increased in a pattern of mild LVH. Systolic function was mildly   to moderately reduced. The  estimated ejection fraction was in the   range of 40% to 45%. Diffuse hypokinesis. There was no evidence   of elevated ventricular filling pressure by Doppler parameters. - Left atrium: The atrium was mildly dilated. Volume/bsa, ES,   (1-plane Simpson&'s, A2C): 35.3 ml/m^2. Left atrium 41 mm  Assessment and Plan: 1. Persistent  Afib Failed Tikosyn for failing to restore SR. Has loaded on amiodarone 200 mg BID since 03/02/18. He is in rate controlled afib today. Patient reports no heart racing on his FitBit since loading on amiodarone. Will continue amiodarone 200 mg daily Continue Xarelto 20 mg daily He may require afib ablation with AAD to maintain SR.  This patients CHA2DS2-VASc Score and unadjusted Ischemic Stroke Rate (% per year) is equal to 2.2 % stroke rate/year from a score of 2  Above score calculated as 1 point each if present [CHF, HTN, DM, Vascular=MI/PAD/Aortic Plaque, Age if 65-74, or Male] Above score calculated as 2 points each if present [Age > 75, or Stroke/TIA/TE]   2. HTN Stable, no changes today.  3. Cardiomyopathy EF 40-45%, likely afib related. Will reassess once he is stable in SR.  F/u with Dr Rayann Heman to discuss afib ablation.  [facility] [address] [place], [place] [address] [phone]

## 2018-04-12 NOTE — Patient Instructions (Addendum)
Your physician recommends that you continue on your current medications as directed. Please refer to the Current Medication list given to you today.   Your physician recommends that you schedule a follow-up appointment in: 04/13/2018 ARRIVE AT 4:00 TO SEE DR. ALLRED, [place]

## 2018-04-13 ENCOUNTER — Encounter: Payer: Self-pay | Admitting: Internal Medicine

## 2018-04-13 ENCOUNTER — Ambulatory Visit (INDEPENDENT_AMBULATORY_CARE_PROVIDER_SITE_OTHER): Payer: 59 | Admitting: Internal Medicine

## 2018-04-13 VITALS — BP 120/86 | HR 81 | Ht 70.0 in | Wt 230.6 lb

## 2018-04-13 DIAGNOSIS — I4819 Other persistent atrial fibrillation: Secondary | ICD-10-CM | POA: Diagnosis not present

## 2018-04-13 DIAGNOSIS — I428 Other cardiomyopathies: Secondary | ICD-10-CM

## 2018-04-13 MED ORDER — METOPROLOL TARTRATE 100 MG PO TABS: 100.0000 mg | ORAL_TABLET | Freq: Once | ORAL | 0 refills | Status: DC

## 2018-04-13 NOTE — Progress Notes (Signed)
Electrophysiology Office Note   Date:  04/13/2018   ID:  Daniel, Romero 07/29/1960, MRN [id number]  PCP:  Denita Lung, MD  Cardiologist:  Dr Gwenlyn Found Primary Electrophysiologist: Thompson Grayer, MD    CC: afib   History of Present Illness: Daniel Romero is a 58 y.o. male who presents today for electrophysiology evaluation.   I saw him last during his recent hospitalization for tikosyn load.  Despite tikosyn, he continued to have afib.  V rates were quite elevated.  tikosyn was discontinued and he was placed on amiodarone.  After several weeks of amiodarone, cardioversion was performed.  He unfortunately returned to afib within several days.  He has fatigue with his afib.  V rates have also been quite difficult to control.  Today, he denies symptoms of palpitations, chest pain, shortness of breath, orthopnea, PND, lower extremity edema, claudication, dizziness, presyncope, syncope, bleeding, or neurologic sequela. The patient is tolerating medications without difficulties and is otherwise without complaint today.    Past Medical History:  Diagnosis Date  . Chronic systolic dysfunction of left ventricle   . Depression   . Hypertension   . Obesity   . Persistent atrial fibrillation   . Seasonal allergies    Past Surgical History:  Procedure Laterality Date  . CARDIOVERSION N/A 12/01/2017   Procedure: CARDIOVERSION;  Surgeon: Larey Dresser, MD;  Location: [facility] ENDOSCOPY;  Service: Cardiovascular;  Laterality: N/A;  . CARDIOVERSION N/A 03/01/2018   Procedure: CARDIOVERSION;  Surgeon: Elouise Munroe, MD;  Location: [facility] ENDOSCOPY;  Service: Cardiovascular;  Laterality: N/A;  . CARDIOVERSION N/A 04/06/2018   Procedure: CARDIOVERSION;  Surgeon: Thayer Headings, MD;  Location: [place];  Service: Cardiovascular;  Laterality: N/A;  . COLONOSCOPY  ~ 2015  . SKIN BIOPSY Left 03/30/2018   squamous cell carcinoma in situ  . WISDOM TOOTH EXTRACTION       Current  Outpatient Medications  Medication Sig Dispense Refill  . amiodarone (PACERONE) 200 MG tablet Take 1 tablet (200 mg total) by mouth 2 (two) times daily. 60 tablet 3  . carvedilol (COREG) 6.25 MG tablet Take 1 tablet (6.25 mg total) by mouth 2 (two) times daily with a meal. 60 tablet 6  . lisinopril-hydrochlorothiazide (PRINZIDE,ZESTORETIC) 10-12.5 MG tablet Take 1 tablet by mouth daily.    . rivaroxaban (XARELTO) 20 MG TABS tablet Take 1 tablet (20 mg total) by mouth daily with supper. 90 tablet 3   No current facility-administered medications for this visit.     Allergies:   Patient has no known allergies.   Social History:  The patient  reports that he quit smoking about 4 years ago. His smoking use included cigarettes. He has a 3.75 pack-year smoking history. He has never used smokeless tobacco. He reports previous alcohol use. He reports previous drug use.   Family History:  The patient's  family history includes Heart disease in his father; Stroke in his father.    ROS:  Please see the history of present illness.   All other systems are personally reviewed and negative.    PHYSICAL EXAM: VS:  BP 120/86   Pulse 81   Ht 5\' 10"  (1.778 m)   Wt 230 lb 9.6 oz (104.6 kg)   SpO2 96%   BMI 33.09 kg/m  , BMI Body mass index is 33.09 kg/m. GEN: Well nourished, well developed, in no acute distress  HEENT: normal  Neck: no JVD, carotid bruits, or masses Cardiac: iRRR; no murmurs, rubs,  or gallops,no edema  Respiratory:  clear to auscultation bilaterally, normal work of breathing GI: soft, nontender, nondistended, + BS MS: no deformity or atrophy  Skin: warm and dry  Neuro:  Strength and sensation are intact Psych: euthymic mood, full affect  EKG:  EKG 04/12/2018 reveals afib 72 bpm   Recent Labs: 03/02/2018: ALT 28; Magnesium 2.0; TSH 2.128 04/06/2018: BUN 14; Creatinine, Ser 1.21; Hemoglobin 16.6; Platelets 316; Potassium 5.3; Sodium 137  personally reviewed   Lipid Panel       Component Value Date/Time   CHOL 193 10/13/2017 1620   TRIG 114 10/13/2017 1620   HDL 58 10/13/2017 1620   CHOLHDL 3.3 10/13/2017 1620   CHOLHDL 3.5 07/21/2016 1435   VLDL 17 07/21/2016 1435   LDLCALC 112 (H) 10/13/2017 1620   personally reviewed   Wt Readings from Last 3 Encounters:  04/13/18 230 lb 9.6 oz (104.6 kg)  04/12/18 229 lb (103.9 kg)  04/06/18 223 lb (101.2 kg)      Other studies personally reviewed: Additional studies/ records that were reviewed today include: AF clinic notes, my prior hospital records  Review of the above records today demonstrates: echo 11/15/17 reveals EF 40%, mild LVH, LA 41 mm volume 83.5 ml   ASSESSMENT AND PLAN:  1.  Persistent afib The patient has symptomatic, recurrent persistent atrial fibrillation. he has failed medical therapy with tikosyn and amiodarone. Chads2vasc score is 2.  he is anticoagulated with xarelto . Therapeutic strategies for afib including medicine and ablation were discussed in detail with the patient today. Risk, benefits, and alternatives to EP study and radiofrequency ablation for afib were also discussed in detail today. These risks include but are not limited to stroke, bleeding, vascular damage, tamponade, perforation, damage to the esophagus, lungs, and other structures, pulmonary vein stenosis, worsening renal function, and death. The patient understands these risk and wishes to proceed.  We will therefore proceed with catheter ablation at the next available time.  Carto, ICE, anesthesia are requested for the procedure.  Will also obtain cardiac CT prior to the procedure to exclude LAA thrombus and further evaluate atrial anatomy.  2. Nonischemic CM EF has previously declined with AF with RVR.  Given [facility] AF data, I think that it is prudent to consider ablation for AF at this time in hopes that his CHF will not worsen.    Current medicines are reviewed at length with the patient today.   The patient does not  have concerns regarding his medicines.  The following changes were made today:  none    Signed, Thompson Grayer, MD  04/13/2018 4:50 PM     [place] [address] [phone] (office) [phone] (fax)

## 2018-04-13 NOTE — Patient Instructions (Addendum)
Medication Instructions:  Your physician recommends that you continue on your current medications as directed. Please refer to the Current Medication list given to you today. If you need a refill on your cardiac medications before your next appointment, please call your pharmacy.   Labwork: You will get lab work within 30 days of your procedure:  BMP and CBC.  Please schedule lab work within 30 days of May 08, 2018   Testing/Procedures: Your physician has requested that you have cardiac CT. Cardiac computed tomography (CT) is a painless test that uses an x-ray machine to take clear, detailed pictures of your heart. For further information please visit [facility]. Please follow instruction sheet as given. You will get a call from our office to schedule the date for this test.  Your physician has recommended that you have an ablation. Catheter ablation is a medical procedure used to treat some cardiac arrhythmias (irregular heartbeats). During catheter ablation, a long, thin, flexible tube is put into a blood vessel in your groin (upper thigh), or neck. This tube is called an ablation catheter. It is then guided to your heart through the blood vessel. Radio frequency waves destroy small areas of heart tissue where abnormal heartbeats may cause an arrhythmia to start. Please see the instruction sheet given to you today.   Follow-Up: You will follow up with Roderic Palau, NP with the Atrial fibrillation (Afib) clinic 4 weeks after your ablation.  You will follow up with Dr. Rayann Heman 3 months after your procedure.  CARDIAC CT INSTRUCTIONS:  Please arrive at the [facility] main entrance of [facility] at xx:xx AM (30-45 minutes prior to test start time)  [facility] [place], [place] [address] [phone]  Proceed to the [facility] Radiology Department (First Floor).  Please follow these instructions carefully (unless otherwise  directed):  Hold all erectile dysfunction medications at least 48 hours prior to test.  On the Night Before the Test: . Be sure to Drink plenty of water. . Do not consume any caffeinated/decaffeinated beverages or chocolate 12 hours prior to your test. . Do not take any antihistamines 12 hours prior to your test.   On the Day of the Test: . Drink plenty of water. Do not drink any water within one hour of the test. . Do not eat any food 4 hours prior to the test. . You may take your regular medications prior to the test.  . Take metoprolol (Lopressor) two hours prior to test.   After the Test: . Drink plenty of water. . After receiving IV contrast, you may experience a mild flushed feeling. This is normal. . On occasion, you may experience a mild rash up to 24 hours after the test. This is not dangerous. If this occurs, you can take Benadryl 25 mg and increase your fluid intake. . If you experience trouble breathing, this can be serious. If it is severe call 911 IMMEDIATELY. If it is mild, please call our office. . If you take any of these medications: Glipizide/Metformin, Avandament, Glucavance, please do not take 48 hours after completing test.    ABLATION INSTRUCTIONS:  Please arrive at the [facility] main entrance of [facility] hospital at: 8:30 am. Do not eat or drink after midnight prior to procedure On the morning of your procedure do not take any medications. Plan for one night stay.  You will need someone to drive you home at discharge.

## 2018-04-13 NOTE — H&P (View-Only) (Signed)
Electrophysiology Office Note   Date:  04/13/2018   ID:  Calub, Tarnow 15-Oct-1960, MRN [id number]  PCP:  Denita Lung, MD  Cardiologist:  Dr Gwenlyn Found Primary Electrophysiologist: Thompson Grayer, MD    CC: afib   History of Present Illness: Daniel Romero is a 58 y.o. male who presents today for electrophysiology evaluation.   I saw him last during his recent hospitalization for tikosyn load.  Despite tikosyn, he continued to have afib.  V rates were quite elevated.  tikosyn was discontinued and he was placed on amiodarone.  After several weeks of amiodarone, cardioversion was performed.  He unfortunately returned to afib within several days.  He has fatigue with his afib.  V rates have also been quite difficult to control.  Today, he denies symptoms of palpitations, chest pain, shortness of breath, orthopnea, PND, lower extremity edema, claudication, dizziness, presyncope, syncope, bleeding, or neurologic sequela. The patient is tolerating medications without difficulties and is otherwise without complaint today.    Past Medical History:  Diagnosis Date  . Chronic systolic dysfunction of left ventricle   . Depression   . Hypertension   . Obesity   . Persistent atrial fibrillation   . Seasonal allergies    Past Surgical History:  Procedure Laterality Date  . CARDIOVERSION N/A 12/01/2017   Procedure: CARDIOVERSION;  Surgeon: Larey Dresser, MD;  Location: [facility] ENDOSCOPY;  Service: Cardiovascular;  Laterality: N/A;  . CARDIOVERSION N/A 03/01/2018   Procedure: CARDIOVERSION;  Surgeon: Elouise Munroe, MD;  Location: [facility] ENDOSCOPY;  Service: Cardiovascular;  Laterality: N/A;  . CARDIOVERSION N/A 04/06/2018   Procedure: CARDIOVERSION;  Surgeon: Thayer Headings, MD;  Location: [place];  Service: Cardiovascular;  Laterality: N/A;  . COLONOSCOPY  ~ 2015  . SKIN BIOPSY Left 03/30/2018   squamous cell carcinoma in situ  . WISDOM TOOTH EXTRACTION       Current  Outpatient Medications  Medication Sig Dispense Refill  . amiodarone (PACERONE) 200 MG tablet Take 1 tablet (200 mg total) by mouth 2 (two) times daily. 60 tablet 3  . carvedilol (COREG) 6.25 MG tablet Take 1 tablet (6.25 mg total) by mouth 2 (two) times daily with a meal. 60 tablet 6  . lisinopril-hydrochlorothiazide (PRINZIDE,ZESTORETIC) 10-12.5 MG tablet Take 1 tablet by mouth daily.    . rivaroxaban (XARELTO) 20 MG TABS tablet Take 1 tablet (20 mg total) by mouth daily with supper. 90 tablet 3   No current facility-administered medications for this visit.     Allergies:   Patient has no known allergies.   Social History:  The patient  reports that he quit smoking about 4 years ago. His smoking use included cigarettes. He has a 3.75 pack-year smoking history. He has never used smokeless tobacco. He reports previous alcohol use. He reports previous drug use.   Family History:  The patient's  family history includes Heart disease in his father; Stroke in his father.    ROS:  Please see the history of present illness.   All other systems are personally reviewed and negative.    PHYSICAL EXAM: VS:  BP 120/86   Pulse 81   Ht 5\' 10"  (1.778 m)   Wt 230 lb 9.6 oz (104.6 kg)   SpO2 96%   BMI 33.09 kg/m  , BMI Body mass index is 33.09 kg/m. GEN: Well nourished, well developed, in no acute distress  HEENT: normal  Neck: no JVD, carotid bruits, or masses Cardiac: iRRR; no murmurs, rubs,  or gallops,no edema  Respiratory:  clear to auscultation bilaterally, normal work of breathing GI: soft, nontender, nondistended, + BS MS: no deformity or atrophy  Skin: warm and dry  Neuro:  Strength and sensation are intact Psych: euthymic mood, full affect  EKG:  EKG 04/12/2018 reveals afib 72 bpm   Recent Labs: 03/02/2018: ALT 28; Magnesium 2.0; TSH 2.128 04/06/2018: BUN 14; Creatinine, Ser 1.21; Hemoglobin 16.6; Platelets 316; Potassium 5.3; Sodium 137  personally reviewed   Lipid Panel       Component Value Date/Time   CHOL 193 10/13/2017 1620   TRIG 114 10/13/2017 1620   HDL 58 10/13/2017 1620   CHOLHDL 3.3 10/13/2017 1620   CHOLHDL 3.5 07/21/2016 1435   VLDL 17 07/21/2016 1435   LDLCALC 112 (H) 10/13/2017 1620   personally reviewed   Wt Readings from Last 3 Encounters:  04/13/18 230 lb 9.6 oz (104.6 kg)  04/12/18 229 lb (103.9 kg)  04/06/18 223 lb (101.2 kg)      Other studies personally reviewed: Additional studies/ records that were reviewed today include: AF clinic notes, my prior hospital records  Review of the above records today demonstrates: echo 11/15/17 reveals EF 40%, mild LVH, LA 41 mm volume 83.5 ml   ASSESSMENT AND PLAN:  1.  Persistent afib The patient has symptomatic, recurrent persistent atrial fibrillation. he has failed medical therapy with tikosyn and amiodarone. Chads2vasc score is 2.  he is anticoagulated with xarelto . Therapeutic strategies for afib including medicine and ablation were discussed in detail with the patient today. Risk, benefits, and alternatives to EP study and radiofrequency ablation for afib were also discussed in detail today. These risks include but are not limited to stroke, bleeding, vascular damage, tamponade, perforation, damage to the esophagus, lungs, and other structures, pulmonary vein stenosis, worsening renal function, and death. The patient understands these risk and wishes to proceed.  We will therefore proceed with catheter ablation at the next available time.  Carto, ICE, anesthesia are requested for the procedure.  Will also obtain cardiac CT prior to the procedure to exclude LAA thrombus and further evaluate atrial anatomy.  2. Nonischemic CM EF has previously declined with AF with RVR.  Given [facility] AF data, I think that it is prudent to consider ablation for AF at this time in hopes that his CHF will not worsen.    Current medicines are reviewed at length with the patient today.   The patient does not  have concerns regarding his medicines.  The following changes were made today:  none    Signed, Thompson Grayer, MD  04/13/2018 4:50 PM     [place] [address] [phone] (office) [phone] (fax)

## 2018-04-17 ENCOUNTER — Other Ambulatory Visit: Payer: 59

## 2018-04-18 ENCOUNTER — Other Ambulatory Visit: Payer: 59

## 2018-04-19 ENCOUNTER — Other Ambulatory Visit: Payer: 59 | Admitting: *Deleted

## 2018-04-19 DIAGNOSIS — I4819 Other persistent atrial fibrillation: Secondary | ICD-10-CM | POA: Diagnosis not present

## 2018-04-20 LAB — BASIC METABOLIC PANEL
BUN/Creatinine Ratio: 17 (ref 9–20)
BUN: 19 mg/dL (ref 6–24)
CO2: 23 mmol/L (ref 20–29)
Calcium: 9.2 mg/dL (ref 8.7–10.2)
Chloride: 102 mmol/L (ref 96–106)
Creatinine, Ser: 1.15 mg/dL (ref 0.76–1.27)
GFR calc Af Amer: 82 mL/min/{1.73_m2} (ref >59)
GFR calc non Af Amer: 71 mL/min/{1.73_m2} (ref >59)
Glucose: 105 mg/dL — ABNORMAL HIGH (ref 65–99)
Potassium: 3.8 mmol/L (ref 3.5–5.2)
Sodium: 141 mmol/L (ref 134–144)

## 2018-04-20 LAB — CBC
HEMOGLOBIN: 15.7 g/dL (ref 13.0–17.7)
Hematocrit: 44.8 % (ref 37.5–51.0)
MCH: 30.3 pg (ref 26.6–33.0)
MCHC: 35 g/dL (ref 31.5–35.7)
MCV: 87 fL (ref 79–97)
Platelets: 329 10*3/uL (ref 150–450)
RBC: 5.18 x10E6/uL (ref 4.14–5.80)
RDW: 13.1 % (ref 11.6–15.4)
WBC: 7.7 10*3/uL (ref 3.4–10.8)

## 2018-04-21 DIAGNOSIS — C4490 Unspecified malignant neoplasm of skin, unspecified: Secondary | ICD-10-CM

## 2018-04-21 HISTORY — DX: Unspecified malignant neoplasm of skin, unspecified: C44.90

## 2018-05-02 ENCOUNTER — Telehealth (HOSPITAL_COMMUNITY): Payer: Self-pay | Admitting: Emergency Medicine

## 2018-05-02 DIAGNOSIS — L820 Inflamed seborrheic keratosis: Secondary | ICD-10-CM | POA: Diagnosis not present

## 2018-05-02 DIAGNOSIS — D045 Carcinoma in situ of skin of trunk: Secondary | ICD-10-CM | POA: Diagnosis not present

## 2018-05-02 NOTE — Telephone Encounter (Signed)
Left message on voicemail with name and callback number Caterin Tabares RN Navigator Cardiac Imaging [facility] [phone] Office [phone] Cell  

## 2018-05-02 NOTE — Telephone Encounter (Signed)
Reaching out to patient to offer assistance regarding upcoming cardiac imaging study; pt verbalizes understanding of appt date/time, parking situation and where to check in, pre-test NPO status and medications ordered, and verified current allergies; name and call back number provided for further questions should they arise Andra Matsuo RN Navigator Cardiac [facility] [phone] office [phone] cell 

## 2018-05-04 ENCOUNTER — Ambulatory Visit (HOSPITAL_COMMUNITY): Admission: RE | Admit: 2018-05-04 | Payer: PRIVATE HEALTH INSURANCE | Source: Ambulatory Visit

## 2018-05-04 ENCOUNTER — Ambulatory Visit (HOSPITAL_COMMUNITY)
Admission: RE | Admit: 2018-05-04 | Discharge: 2018-05-04 | Disposition: A | Discharge status: Home / Self Care | Payer: PRIVATE HEALTH INSURANCE | Source: Ambulatory Visit | Attending: Internal Medicine | Admitting: Internal Medicine

## 2018-05-04 DIAGNOSIS — I4819 Other persistent atrial fibrillation: Secondary | ICD-10-CM | POA: Insufficient documentation

## 2018-05-04 MED ORDER — IOPAMIDOL (ISOVUE-370) INJECTION 76%
80.0000 mL | Freq: Once | INTRAVENOUS | Status: AC | PRN
  Administered 2018-05-04: 80 mL via INTRAVENOUS

## 2018-05-07 ENCOUNTER — Telehealth: Payer: Self-pay | Admitting: Internal Medicine

## 2018-05-07 NOTE — Telephone Encounter (Signed)
Responded via MyChart.

## 2018-05-07 NOTE — Telephone Encounter (Signed)
New Message   PT called to confirm his scheduled Ablation procedure for tomorrow. He said he didn't see it was scheduled on mychart and wasn't sure becasue he had a CT on Friday if anything had changed

## 2018-05-08 ENCOUNTER — Ambulatory Visit (HOSPITAL_COMMUNITY): Payer: PRIVATE HEALTH INSURANCE | Admitting: Certified Registered"

## 2018-05-08 ENCOUNTER — Encounter (HOSPITAL_COMMUNITY)
Admission: RE | Disposition: A | Discharge status: Home / Self Care | Payer: Self-pay | Source: Home / Self Care | Attending: Internal Medicine

## 2018-05-08 ENCOUNTER — Other Ambulatory Visit: Payer: Self-pay

## 2018-05-08 ENCOUNTER — Ambulatory Visit (HOSPITAL_COMMUNITY)
Admission: RE | Admit: 2018-05-08 | Discharge: 2018-05-09 | Disposition: A | Discharge status: Home / Self Care | Payer: PRIVATE HEALTH INSURANCE | Attending: Internal Medicine | Admitting: Internal Medicine

## 2018-05-08 ENCOUNTER — Encounter (HOSPITAL_COMMUNITY): Payer: Self-pay | Admitting: Certified Registered"

## 2018-05-08 DIAGNOSIS — F329 Major depressive disorder, single episode, unspecified: Secondary | ICD-10-CM | POA: Diagnosis not present

## 2018-05-08 DIAGNOSIS — Z87891 Personal history of nicotine dependence: Secondary | ICD-10-CM | POA: Insufficient documentation

## 2018-05-08 DIAGNOSIS — Z8679 Personal history of other diseases of the circulatory system: Secondary | ICD-10-CM | POA: Diagnosis present

## 2018-05-08 DIAGNOSIS — I4892 Unspecified atrial flutter: Secondary | ICD-10-CM | POA: Diagnosis not present

## 2018-05-08 DIAGNOSIS — E669 Obesity, unspecified: Secondary | ICD-10-CM | POA: Diagnosis not present

## 2018-05-08 DIAGNOSIS — I4819 Other persistent atrial fibrillation: Secondary | ICD-10-CM | POA: Diagnosis not present

## 2018-05-08 DIAGNOSIS — I10 Essential (primary) hypertension: Secondary | ICD-10-CM | POA: Insufficient documentation

## 2018-05-08 DIAGNOSIS — Z23 Encounter for immunization: Secondary | ICD-10-CM | POA: Diagnosis not present

## 2018-05-08 DIAGNOSIS — Z6833 Body mass index (BMI) 33.0-33.9, adult: Secondary | ICD-10-CM | POA: Insufficient documentation

## 2018-05-08 DIAGNOSIS — Z79899 Other long term (current) drug therapy: Secondary | ICD-10-CM | POA: Insufficient documentation

## 2018-05-08 DIAGNOSIS — Z7901 Long term (current) use of anticoagulants: Secondary | ICD-10-CM | POA: Insufficient documentation

## 2018-05-08 DIAGNOSIS — I428 Other cardiomyopathies: Secondary | ICD-10-CM | POA: Diagnosis not present

## 2018-05-08 DIAGNOSIS — I4891 Unspecified atrial fibrillation: Secondary | ICD-10-CM | POA: Diagnosis not present

## 2018-05-08 HISTORY — PX: ATRIAL FIBRILLATION ABLATION: EP1191

## 2018-05-08 HISTORY — DX: Angina pectoris, unspecified: I20.9

## 2018-05-08 HISTORY — DX: Unspecified malignant neoplasm of skin, unspecified: C44.90

## 2018-05-08 LAB — POCT ACTIVATED CLOTTING TIME
Activated Clotting Time: 246 seconds
Activated Clotting Time: 285 seconds
Activated Clotting Time: 246 seconds
Activated Clotting Time: 356 seconds
Activated Clotting Time: 219 seconds
Activated Clotting Time: 202 seconds

## 2018-05-08 SURGERY — ATRIAL FIBRILLATION ABLATION
Anesthesia: General

## 2018-05-08 MED ORDER — ACETAMINOPHEN 500 MG PO TABS
1000.0000 mg | ORAL_TABLET | Freq: Once | ORAL | Status: AC
  Administered 2018-05-08: 1000 mg via ORAL

## 2018-05-08 MED ORDER — ISOPROTERENOL HCL 0.2 MG/ML IJ SOLN
INTRAVENOUS | Status: DC | PRN
  Administered 2018-05-08: 20 ug/min via INTRAVENOUS

## 2018-05-08 MED ORDER — ACETAMINOPHEN 325 MG PO TABS: 650.0000 mg | ORAL_TABLET | ORAL | Status: DC | PRN

## 2018-05-08 MED ORDER — HEPARIN SODIUM (PORCINE) 1000 UNIT/ML IJ SOLN
INTRAMUSCULAR | Status: DC | PRN
  Administered 2018-05-08: 12000 [IU] via INTRAVENOUS
  Administered 2018-05-08 (×2): 1000 [IU] via INTRAVENOUS

## 2018-05-08 MED ORDER — SUGAMMADEX SODIUM 200 MG/2ML IV SOLN
INTRAVENOUS | Status: DC | PRN
  Administered 2018-05-08: 200 mg via INTRAVENOUS

## 2018-05-08 MED ORDER — HEPARIN (PORCINE) IN NACL 1000-0.9 UT/500ML-% IV SOLN
INTRAVENOUS | Status: AC
  Filled 2018-05-08: qty 500

## 2018-05-08 MED ORDER — HEPARIN (PORCINE) IN NACL 1000-0.9 UT/500ML-% IV SOLN
INTRAVENOUS | Status: DC | PRN
  Administered 2018-05-08: 500 mL

## 2018-05-08 MED ORDER — SODIUM CHLORIDE 0.9% FLUSH: 3.0000 mL | INTRAVENOUS | Status: DC | PRN

## 2018-05-08 MED ORDER — HYDROCODONE-ACETAMINOPHEN 5-325 MG PO TABS: 1.0000 | ORAL_TABLET | ORAL | Status: DC | PRN

## 2018-05-08 MED ORDER — LISINOPRIL 10 MG PO TABS
10.0000 mg | ORAL_TABLET | Freq: Every day | ORAL | Status: DC
  Administered 2018-05-09: 10:00:00 10 mg via ORAL
  Filled 2018-05-08: qty 1

## 2018-05-08 MED ORDER — SODIUM CHLORIDE 0.9 % IV SOLN
INTRAVENOUS | Status: DC
  Administered 2018-05-08 (×2): via INTRAVENOUS

## 2018-05-08 MED ORDER — SODIUM CHLORIDE 0.9% FLUSH: 3.0000 mL | Freq: Two times a day (BID) | INTRAVENOUS | Status: DC

## 2018-05-08 MED ORDER — LIDOCAINE 2% (20 MG/ML) 5 ML SYRINGE
INTRAMUSCULAR | Status: DC | PRN
  Administered 2018-05-08: 60 mg via INTRAVENOUS

## 2018-05-08 MED ORDER — DEXAMETHASONE SODIUM PHOSPHATE 10 MG/ML IJ SOLN
INTRAMUSCULAR | Status: DC | PRN
  Administered 2018-05-08: 10 mg via INTRAVENOUS

## 2018-05-08 MED ORDER — SODIUM CHLORIDE 0.9 % IV SOLN: 250.0000 mL | INTRAVENOUS | Status: DC | PRN

## 2018-05-08 MED ORDER — BUPIVACAINE HCL (PF) 0.25 % IJ SOLN
INTRAMUSCULAR | Status: AC
  Filled 2018-05-08: qty 30

## 2018-05-08 MED ORDER — ONDANSETRON HCL 4 MG/2ML IJ SOLN
INTRAMUSCULAR | Status: DC | PRN
  Administered 2018-05-08: 4 mg via INTRAVENOUS

## 2018-05-08 MED ORDER — BUPIVACAINE HCL (PF) 0.25 % IJ SOLN
INTRAMUSCULAR | Status: DC | PRN
  Administered 2018-05-08: 30 mL

## 2018-05-08 MED ORDER — PROTAMINE SULFATE 10 MG/ML IV SOLN
INTRAVENOUS | Status: DC | PRN
  Administered 2018-05-08: 30 mg via INTRAVENOUS

## 2018-05-08 MED ORDER — ROCURONIUM BROMIDE 50 MG/5ML IV SOSY
PREFILLED_SYRINGE | INTRAVENOUS | Status: DC | PRN
  Administered 2018-05-08: 10 mg via INTRAVENOUS
  Administered 2018-05-08: 50 mg via INTRAVENOUS
  Administered 2018-05-08: 20 mg via INTRAVENOUS

## 2018-05-08 MED ORDER — INFLUENZA VAC SPLIT HIGH-DOSE 0.5 ML IM SUSY
0.5000 mL | PREFILLED_SYRINGE | Freq: Once | INTRAMUSCULAR | Status: AC
  Administered 2018-05-09: 0.5 mL via INTRAMUSCULAR
  Filled 2018-05-08 (×2): qty 0.5

## 2018-05-08 MED ORDER — ONDANSETRON HCL 4 MG/2ML IJ SOLN: 4.0000 mg | Freq: Four times a day (QID) | INTRAMUSCULAR | Status: DC | PRN

## 2018-05-08 MED ORDER — ACETAMINOPHEN 500 MG PO TABS
ORAL_TABLET | ORAL | Status: AC
  Filled 2018-05-08: qty 2

## 2018-05-08 MED ORDER — HEPARIN SODIUM (PORCINE) 1000 UNIT/ML IJ SOLN
INTRAMUSCULAR | Status: AC
  Filled 2018-05-08: qty 1

## 2018-05-08 MED ORDER — FENTANYL CITRATE (PF) 250 MCG/5ML IJ SOLN
INTRAMUSCULAR | Status: DC | PRN
  Administered 2018-05-08 (×2): 50 ug via INTRAVENOUS
  Administered 2018-05-08: 100 ug via INTRAVENOUS

## 2018-05-08 MED ORDER — PHENYLEPHRINE 40 MCG/ML (10ML) SYRINGE FOR IV PUSH (FOR BLOOD PRESSURE SUPPORT)
PREFILLED_SYRINGE | INTRAVENOUS | Status: DC | PRN
  Administered 2018-05-08 (×4): 80 ug via INTRAVENOUS

## 2018-05-08 MED ORDER — HEPARIN SODIUM (PORCINE) 1000 UNIT/ML IJ SOLN
INTRAMUSCULAR | Status: DC | PRN
  Administered 2018-05-08: 5000 [IU] via INTRAVENOUS
  Administered 2018-05-08: 6000 [IU] via INTRAVENOUS

## 2018-05-08 MED ORDER — CARVEDILOL 3.125 MG PO TABS
6.2500 mg | ORAL_TABLET | Freq: Two times a day (BID) | ORAL | Status: DC
  Administered 2018-05-09: 08:00:00 6.25 mg via ORAL
  Filled 2018-05-08 (×2): qty 2

## 2018-05-08 MED ORDER — LISINOPRIL-HYDROCHLOROTHIAZIDE 10-12.5 MG PO TABS: 1.0000 | ORAL_TABLET | Freq: Every day | ORAL | Status: DC

## 2018-05-08 MED ORDER — ISOPROTERENOL HCL 0.2 MG/ML IJ SOLN
INTRAMUSCULAR | Status: AC
  Filled 2018-05-08: qty 5

## 2018-05-08 MED ORDER — MIDAZOLAM HCL 5 MG/5ML IJ SOLN
INTRAMUSCULAR | Status: DC | PRN
  Administered 2018-05-08: 2 mg via INTRAVENOUS

## 2018-05-08 MED ORDER — OFF THE BEAT BOOK
Freq: Once | Status: AC
  Administered 2018-05-09: 1
  Filled 2018-05-08: qty 1

## 2018-05-08 MED ORDER — HYDROCHLOROTHIAZIDE 12.5 MG PO CAPS
12.5000 mg | ORAL_CAPSULE | Freq: Every day | ORAL | Status: DC
  Administered 2018-05-09: 12.5 mg via ORAL
  Filled 2018-05-08: qty 1

## 2018-05-08 MED ORDER — RIVAROXABAN 20 MG PO TABS
20.0000 mg | ORAL_TABLET | Freq: Once | ORAL | Status: AC
  Administered 2018-05-08: 20 mg via ORAL
  Filled 2018-05-08: qty 1

## 2018-05-08 MED ORDER — PROPOFOL 10 MG/ML IV BOLUS
INTRAVENOUS | Status: DC | PRN
  Administered 2018-05-08: 150 mg via INTRAVENOUS

## 2018-05-08 SURGICAL SUPPLY — 17 items
BLANKET WARM UNDERBOD FULL ACC (MISCELLANEOUS) ×2 IMPLANT
CATH MAPPNG PENTARAY F 2-6-2MM (CATHETERS) ×1 IMPLANT
CATH NAVISTAR SMARTTOUCH DF (ABLATOR) ×4 IMPLANT
CATH SOUNDSTAR 3D IMAGING (CATHETERS) ×2 IMPLANT
CATH WEBSTER BI DIR CS D-F CRV (CATHETERS) ×2 IMPLANT
COVER SWIFTLINK CONNECTOR (BAG) ×2 IMPLANT
NEEDLE BAYLIS TRANSSEPTAL 71CM (NEEDLE) ×2 IMPLANT
PACK EP LATEX FREE (CUSTOM PROCEDURE TRAY) ×1
PACK EP LF (CUSTOM PROCEDURE TRAY) ×1 IMPLANT
PAD PRO RADIOLUCENT 2001M-C (PAD) ×2 IMPLANT
PATCH CARTO3 (PAD) ×2 IMPLANT
PENTARAY F 2-6-2MM (CATHETERS) ×2
SHEATH AVANTI 11F 11CM (SHEATH) ×2 IMPLANT
SHEATH PINNACLE 7F 10CM (SHEATH) ×4 IMPLANT
SHEATH PINNACLE 9F 10CM (SHEATH) ×2 IMPLANT
SHEATH SWARTZ TS SL2 63CM 8.5F (SHEATH) ×2 IMPLANT
TUBING SMART ABLATE COOLFLOW (TUBING) ×2 IMPLANT

## 2018-05-08 NOTE — Anesthesia Postprocedure Evaluation (Signed)
Anesthesia Post Note  Patient: Daniel Romero  Procedure(s) Performed: ATRIAL FIBRILLATION ABLATION (N/A )     Patient location during evaluation: Cath Lab Anesthesia Type: General Level of consciousness: awake and alert Pain management: pain level controlled Vital Signs Assessment: post-procedure vital signs reviewed and stable Respiratory status: spontaneous breathing, nonlabored ventilation, respiratory function stable and patient connected to nasal cannula oxygen Cardiovascular status: blood pressure returned to baseline and stable Postop Assessment: no apparent nausea or vomiting Anesthetic complications: no    Last Vitals:  Vitals:   05/08/18 1320 05/08/18 1335  BP: 111/74 106/68  Pulse: (!) 51 (!) 49  Resp: 11 (!) 9  Temp:  (!) 36.3 C  SpO2: 93% 95%    Last Pain:  Vitals:   05/08/18 1335  TempSrc: Temporal  PainSc:                  Ardeth Repetto,W. EDMOND

## 2018-05-08 NOTE — Discharge Summary (Addendum)
ELECTROPHYSIOLOGY PROCEDURE DISCHARGE SUMMARY    Patient ID: Daniel Romero,  MRN: [id number], DOB/AGE: [birth year] 58 y.o.  Admit date: 05/08/2018 Discharge date: 05/09/2018  Primary Care Physician: Denita Lung, MD  Primary Cardiologist: Dr. Gwenlyn Found Electrophysiologist: Thompson Grayer, MD  Primary Discharge Diagnosis:  1. Persistent AFib     CHA2DS2Vasc is 2, on Xarelto, appropriately dosed  Secondary Discharge Diagnosis:  1. HTN 2. NICM (suspect 2/2 AF, tachycardia)  Procedures This Admission:  1.  Electrophysiology study and radiofrequency catheter ablation on 05/08/2018 by Dr Thompson Grayer.   This study demonstrated    Brief HPI: Daniel Romero is a 58 y.o. male with a history of persistent atrial fibrillation. He failed medical therapy with Tikosyn and amiodarone. Risks, benefits, and alternatives to catheter ablation of atrial fibrillation were reviewed with the patient who wished to proceed.  The patient underwent cardiac CT prior to the procedure which demonstrated no LAA thrombus.    Hospital Course:  The patient was admitted and underwent EPS/RFCA of atrial fibrillation with details as outlined above.  They were monitored on telemetry overnight which demonstrated SR.  R groin was without complication on the day of discharge.  The patient feels well this morning, no CP or SOB, he was examined by Dr. Rayann Heman and considered to be stable for discharge.  Wound care and restrictions were reviewed with the patient.  The patient will be seen back by the AFib clinic in 4 weeks and Dr Rayann Heman in 12 weeks for post ablation follow up.     Physical Exam: Vitals:   05/09/18 0425 05/09/18 0525 05/09/18 0625 05/09/18 0824  BP: 96/64 115/77 119/67 132/85  Pulse: 75 (!) 53 62 74  Resp: 14 16 16 20   Temp:   97.7 F (36.5 C) 97.9 F (36.6 C)  TempSrc:   Oral Oral  SpO2: 95% 95% 98% 95%  Weight:   102 kg   Height:        GEN- The patient is well appearing, alert and  oriented x 3 today.   HEENT: normocephalic, atraumatic; sclera clear, conjunctiva pink; hearing intact; oropharynx clear; neck supple  Lungs- CTA b/l, normal work of breathing.  No wheezes, rales, rhonchi Heart- RRR, no murmurs, rubs or gallops  GI- soft, non-tender, non-distended Extremities- no clubbing, cyanosis, or edema; DP/PT pulses 2+ bilaterally, R groin without hematoma/bruit MS- no significant deformity or atrophy Skin- warm and dry, no rash or lesion Psych- euthymic mood, full affect Neuro- strength and sensation are intact   Labs:   Lab Results  Component Value Date   WBC 7.7 04/19/2018   HGB 15.7 04/19/2018   HCT 44.8 04/19/2018   MCV 87 04/19/2018   PLT 329 04/19/2018   No results for input(s): NA, K, CL, CO2, BUN, CREATININE, CALCIUM, PROT, BILITOT, ALKPHOS, ALT, AST, GLUCOSE in the last 168 hours.  Invalid input(s): LABALBU   Discharge Medications:  Allergies as of 05/09/2018   No Known Allergies     Medication List    STOP taking these medications   amiodarone 200 MG tablet Commonly known as:  PACERONE   metoprolol tartrate 100 MG tablet Commonly known as:  LOPRESSOR     TAKE these medications   carvedilol 6.25 MG tablet Commonly known as:  COREG Take 1 tablet (6.25 mg total) by mouth 2 (two) times daily with a meal.   lisinopril-hydrochlorothiazide 10-12.5 MG tablet Commonly known as:  PRINZIDE,ZESTORETIC Take 1 tablet by mouth daily.   pantoprazole 40  MG tablet Commonly known as:  PROTONIX Take 1 tablet (40 mg total) by mouth daily.   rivaroxaban 20 MG Tabs tablet Commonly known as:  XARELTO Take 1 tablet (20 mg total) by mouth daily with supper.       Disposition:  Home  Discharge Instructions    Diet - low sodium heart healthy   Complete by:  As directed    Increase activity slowly   Complete by:  As directed      Follow-up Information    [facility].   Specialty:  Cardiology Why:   06/06/2018 @ 3:30PM Contact information: [address] [id number] [place] [address] [phone]       Thompson Grayer, MD Follow up.   Specialty:  Cardiology Why:  08/06/2018 @ 9:45AM Contact information: [place] [address] [phone]           Duration of Discharge Encounter: Greater than 30 minutes including physician time.  SignedTommye Standard, PA-C 05/09/2018 8:56 AM   I have seen, examined the patient, and reviewed the above assessment and plan.  Changes to above are made where necessary.  On exam, RRR.  Doing well s/p ablation.  Routine discharge and follow-up.  Co Sign: Thompson Grayer, MD 05/09/2018

## 2018-05-08 NOTE — Anesthesia Preprocedure Evaluation (Addendum)
Anesthesia Evaluation  Patient identified by MRN, date of birth, ID band Patient awake    Reviewed: Allergy & Precautions, H&P , NPO status , Patient's Chart, lab work & pertinent test results, reviewed documented beta blocker date and time   Airway Mallampati: II  TM Distance: >3 FB Neck ROM: Full    Dental no notable dental hx. (+) Teeth Intact, Dental Advisory Given   Pulmonary neg pulmonary ROS, former smoker,    Pulmonary exam normal breath sounds clear to auscultation       Cardiovascular hypertension, Pt. on medications and Pt. on home beta blockers + dysrhythmias Atrial Fibrillation  Rhythm:Regular Rate:Normal     Neuro/Psych Depression negative neurological ROS     GI/Hepatic negative GI ROS, Neg liver ROS,   Endo/Other  negative endocrine ROS  Renal/GU negative Renal ROS  negative genitourinary   Musculoskeletal   Abdominal   Peds  Hematology negative hematology ROS (+)   Anesthesia Other Findings   Reproductive/Obstetrics negative OB ROS                            Anesthesia Physical Anesthesia Plan  ASA: III  Anesthesia Plan: General   Post-op Pain Management:    Induction: Intravenous  PONV Risk Score and Plan: 3 and Ondansetron, Dexamethasone and Midazolam  Airway Management Planned: Oral ETT  Additional Equipment:   Intra-op Plan:   Post-operative Plan: Extubation in OR  Informed Consent: I have reviewed the patients History and Physical, chart, labs and discussed the procedure including the risks, benefits and alternatives for the proposed anesthesia with the patient or authorized representative who has indicated his/her understanding and acceptance.     Dental advisory given  Plan Discussed with: CRNA  Anesthesia Plan Comments:         Anesthesia Quick Evaluation

## 2018-05-08 NOTE — Transfer of Care (Addendum)
Immediate Anesthesia Transfer of Care Note  Patient: Daniel Romero  Procedure(s) Performed: ATRIAL FIBRILLATION ABLATION (N/A )  Patient Location: Cath Lab  Anesthesia Type:General  Level of Consciousness: awake, alert  and oriented  Airway & Oxygen Therapy: Patient Spontanous Breathing and Patient connected to nasal cannula oxygen  Post-op Assessment: Report given to RN and Post -op Vital signs reviewed and stable  Post vital signs: Reviewed and stable  Last Vitals:  Vitals Value Taken Time  BP 108/67 05/08/2018  2:21 PM  Temp    Pulse 45 05/08/2018  2:33 PM  Resp 11 05/08/2018  2:33 PM  SpO2 97 % 05/08/2018  2:33 PM  Vitals shown include unvalidated device data.  Last Pain:  Vitals:   05/08/18 1335  TempSrc: Temporal  PainSc:          Complications: No apparent anesthesia complications

## 2018-05-08 NOTE — Progress Notes (Addendum)
Site Area: Right Femoral Vein Sheath x 3 Site Prior to Removal: Level 0 Pressure Applied For: 25 Mins Manual Pressure: Yes Pt Status During Pull:  Stable Post Pull Site: Level 0 Post Pull Instructions Given:  Yes Post Pull Pulses Present: DP Palpable Dressing Applied: Tegaderm with Gauze Bedrest Begins @15 :15 til 20:15

## 2018-05-08 NOTE — Anesthesia Procedure Notes (Signed)
Procedure Name: Intubation Date/Time: 05/08/2018 10:40 AM Performed by: Imagene Riches, CRNA Pre-anesthesia Checklist: Patient identified, Emergency Drugs available, Suction available and Patient being monitored Patient Re-evaluated:Patient Re-evaluated prior to induction Oxygen Delivery Method: Circle System Utilized Preoxygenation: Pre-oxygenation with 100% oxygen Induction Type: IV induction Ventilation: Mask ventilation without difficulty Laryngoscope Size: Miller and 3 Grade View: Grade I Tube type: Oral Tube size: 7.5 mm Number of attempts: 1 Airway Equipment and Method: Stylet and Oral airway Placement Confirmation: ETT inserted through vocal cords under direct vision,  positive ETCO2 and breath sounds checked- equal and bilateral Secured at: 22 cm Tube secured with: Tape Dental Injury: Teeth and Oropharynx as per pre-operative assessment

## 2018-05-08 NOTE — Anesthesia Postprocedure Evaluation (Signed)
Anesthesia Post Note  Patient: Daniel Romero  Procedure(s) Performed: ATRIAL FIBRILLATION ABLATION (N/A )     Patient location during evaluation: PACU Anesthesia Type: General Level of consciousness: awake and alert Pain management: pain level controlled Vital Signs Assessment: post-procedure vital signs reviewed and stable Respiratory status: spontaneous breathing, nonlabored ventilation, respiratory function stable and patient connected to nasal cannula oxygen Cardiovascular status: blood pressure returned to baseline and stable Postop Assessment: no apparent nausea or vomiting Anesthetic complications: no    Last Vitals:  Vitals:   05/08/18 1320 05/08/18 1335  BP: 111/74 106/68  Pulse: (!) 51 (!) 49  Resp: 11 (!) 9  Temp:  (!) 36.3 C  SpO2: 93% 95%    Last Pain:  Vitals:   05/08/18 1335  TempSrc: Temporal  PainSc:                  Ceferino Lang,W. EDMOND

## 2018-05-08 NOTE — Discharge Instructions (Signed)
Post procedure care instructions No driving for 4 days. No lifting over 5 lbs for 1 week. No vigorous or sexual activity for 1 week. You may return to work on 05/15/2018. Keep procedure site clean & dry. If you notice increased pain, swelling, bleeding or pus, call/return!  You may shower, but no soaking baths/hot tubs/pools for 1 week.   You have an appointment set up with the [facility].  Multiple studies have shown that being followed by a dedicated atrial fibrillation clinic in addition to the standard care you receive from your other physicians improves health. We believe that enrollment in the atrial fibrillation clinic will allow [place] to better care for you.   The phone number to the [facility] is [phone]. The clinic is staffed Monday through Friday from 8:30am to 5pm.  Parking Directions: The clinic is located in the Heart and Vascular Building connected to [facility]. 1)From [address] turn on to [address] and go to the 3rd entrance  (Heart and Vascular entrance) on the right. 2)Look to the right for Heart &Vascular Parking Garage. 3)A code for the entrance is required please call the clinic to receive this.   4)Take the elevators to the 1st floor. Registration is in the room with the glass walls at the end of the hallway.  If you have any trouble parking or locating the clinic, please dont hesitate to call [phone].

## 2018-05-08 NOTE — Interval H&P Note (Signed)
History and Physical Interval Note:  05/08/2018 10:17 AM  Daniel Romero  has presented today for surgery, with the diagnosis of A fib  The various methods of treatment have been discussed with the patient and family. After consideration of risks, benefits and other options for treatment, the patient has consented to  Procedure(s): ATRIAL FIBRILLATION ABLATION (N/A) as a surgical intervention .  The patient's history has been reviewed, patient examined, no change in status, stable for surgery.  I have reviewed the patient's chart and labs.  Questions were answered to the patient's satisfaction.     Cardiac CT reviewed with patient today.  He reports compliance with xarelto without interruption. Risk, benefits, and alternatives to EP study and radiofrequency ablation for afib were also discussed in detail today. These risks include but are not limited to stroke, bleeding, vascular damage, tamponade, perforation, damage to the esophagus, lungs, and other structures, pulmonary vein stenosis, worsening renal function, and death. The patient understands these risk and wishes to proceed.     Thompson Grayer MD, [facility] 05/08/2018 10:18 AM

## 2018-05-08 NOTE — Progress Notes (Signed)
Patient right groin site rebled post voiding. Hemostasis obtained after 10 min  manual pressure  on right groin   Patient denies pain. Dr Rayann Heman notified and seen patient. Vital signs stable , monitored groin ,.bedrest maintained at this time.

## 2018-05-09 ENCOUNTER — Encounter (HOSPITAL_COMMUNITY): Payer: Self-pay | Admitting: Internal Medicine

## 2018-05-09 DIAGNOSIS — I4892 Unspecified atrial flutter: Secondary | ICD-10-CM | POA: Diagnosis not present

## 2018-05-09 DIAGNOSIS — I4819 Other persistent atrial fibrillation: Secondary | ICD-10-CM | POA: Diagnosis not present

## 2018-05-09 DIAGNOSIS — I10 Essential (primary) hypertension: Secondary | ICD-10-CM | POA: Diagnosis not present

## 2018-05-09 MED ORDER — PANTOPRAZOLE SODIUM 40 MG PO TBEC: 40.0000 mg | DELAYED_RELEASE_TABLET | Freq: Every day | ORAL | 0 refills | Status: DC

## 2018-05-14 ENCOUNTER — Emergency Department (HOSPITAL_COMMUNITY)
Admission: EM | Admit: 2018-05-14 | Discharge: 2018-05-15 | Disposition: A | Discharge status: Home / Self Care | Payer: 59 | Attending: Emergency Medicine | Admitting: Emergency Medicine

## 2018-05-14 ENCOUNTER — Other Ambulatory Visit: Payer: Self-pay

## 2018-05-14 ENCOUNTER — Encounter (HOSPITAL_COMMUNITY): Payer: Self-pay

## 2018-05-14 DIAGNOSIS — R58 Hemorrhage, not elsewhere classified: Secondary | ICD-10-CM | POA: Diagnosis not present

## 2018-05-14 DIAGNOSIS — Z9889 Other specified postprocedural states: Secondary | ICD-10-CM

## 2018-05-14 DIAGNOSIS — Z87891 Personal history of nicotine dependence: Secondary | ICD-10-CM | POA: Diagnosis not present

## 2018-05-14 DIAGNOSIS — Z85828 Personal history of other malignant neoplasm of skin: Secondary | ICD-10-CM | POA: Diagnosis not present

## 2018-05-14 DIAGNOSIS — I10 Essential (primary) hypertension: Secondary | ICD-10-CM | POA: Insufficient documentation

## 2018-05-14 DIAGNOSIS — Z79899 Other long term (current) drug therapy: Secondary | ICD-10-CM | POA: Insufficient documentation

## 2018-05-14 DIAGNOSIS — R001 Bradycardia, unspecified: Secondary | ICD-10-CM | POA: Diagnosis not present

## 2018-05-14 LAB — PROTIME-INR
INR: 1.8
Prothrombin Time: 20.4 seconds — ABNORMAL HIGH (ref 11.4–15.2)

## 2018-05-14 LAB — CBC
HCT: 45.3 % (ref 39.0–52.0)
Hemoglobin: 15.1 g/dL (ref 13.0–17.0)
MCH: 30 pg (ref 26.0–34.0)
MCHC: 33.3 g/dL (ref 30.0–36.0)
MCV: 90.1 fL (ref 80.0–100.0)
Platelets: 308 10*3/uL (ref 150–400)
RBC: 5.03 MIL/uL (ref 4.22–5.81)
RDW: 13.4 % (ref 11.5–15.5)
WBC: 9 10*3/uL (ref 4.0–10.5)
nRBC: 0 % (ref 0.0–0.2)

## 2018-05-14 LAB — BASIC METABOLIC PANEL
Anion gap: 10 (ref 5–15)
BUN: 17 mg/dL (ref 6–20)
CO2: 24 mmol/L (ref 22–32)
Calcium: 8.9 mg/dL (ref 8.9–10.3)
Chloride: 103 mmol/L (ref 98–111)
Creatinine, Ser: 1.3 mg/dL — ABNORMAL HIGH (ref 0.61–1.24)
GFR calc Af Amer: >60 mL/min (ref >60)
GFR calc non Af Amer: >60 mL/min (ref >60)
Glucose, Bld: 176 mg/dL — ABNORMAL HIGH (ref 70–99)
POTASSIUM: 3.8 mmol/L (ref 3.5–5.1)
Sodium: 137 mmol/L (ref 135–145)

## 2018-05-14 NOTE — ED Triage Notes (Signed)
Pt here for bruising to right groin post cath.  Cath done on  The 18th.  On Xarelto for blood thinners.  No visible bleeding just not having the bruising go away.

## 2018-05-15 ENCOUNTER — Encounter (HOSPITAL_COMMUNITY): Payer: Self-pay | Admitting: Student

## 2018-05-15 ENCOUNTER — Emergency Department (HOSPITAL_BASED_OUTPATIENT_CLINIC_OR_DEPARTMENT_OTHER)
Admission: RE | Admit: 2018-05-15 | Discharge: 2018-05-15 | Disposition: A | Discharge status: Home / Self Care | Payer: 59 | Source: Ambulatory Visit | Attending: Student | Admitting: Student

## 2018-05-15 ENCOUNTER — Emergency Department (HOSPITAL_COMMUNITY): Payer: 59

## 2018-05-15 DIAGNOSIS — I724 Aneurysm of artery of lower extremity: Secondary | ICD-10-CM | POA: Diagnosis not present

## 2018-05-15 MED ORDER — IOPAMIDOL (ISOVUE-370) INJECTION 76%
INTRAVENOUS | Status: AC
  Administered 2018-05-15: 100 mL
  Filled 2018-05-15: qty 100

## 2018-05-15 NOTE — ED Provider Notes (Signed)
[facility] EMERGENCY DEPARTMENT Provider Note   CSN: [id number] Arrival date & time: 05/14/18  2045    History   Chief Complaint Chief Complaint  Patient presents with  . Post-op Problem    HPI Daniel Romero is a 58 y.o. male with a hx of afib s/p radiofrequency catheter ablation 05/08/18 by Dr. Rayann Heman anticoagulated on xarelto who presents to the ED with concern for post procedural ecchymosis to the R groin that has been progressively worsening since discharge from the hospital. Patient states that bruising seems to be spreading around the catheter insertion site for the procedure. No specific alleviating/aggravating factors. Not necessarily painful. No subsequent injuries. Denies fever, chills, numbness, or weakness. He states he has been doing well since the ablation but is concerned the afib may have come back due to not "just not feeling right", denies chest pain, dyspnea, palpitations, lightheadedness, dizziness or syncope.      HPI  Past Medical History:  Diagnosis Date  . Anginal pain ([place])   . Chronic systolic dysfunction of left ventricle   . Depression   . Hypertension   . Obesity   . Persistent atrial fibrillation   . Seasonal allergies   . Skin cancer 04/2018   BACK    Patient Active Problem List   Diagnosis Date Noted  . Left ventricular dysfunction 01/10/2018  . Persistent atrial fibrillation 11/08/2017  . Routine general medical examination at a health care facility 10/13/2017  . Hypertension 06/03/2013  . History of alcohol abuse 06/03/2013  . Former smoker 06/03/2013    Past Surgical History:  Procedure Laterality Date  . ATRIAL FIBRILLATION ABLATION  05/08/2018  . ATRIAL FIBRILLATION ABLATION N/A 05/08/2018   Procedure: ATRIAL FIBRILLATION ABLATION;  Surgeon: Thompson Grayer, MD;  Location: [place] CV LAB;  Service: Cardiovascular;  Laterality: N/A;  . CARDIOVERSION N/A 12/01/2017   Procedure: CARDIOVERSION;  Surgeon:  Larey Dresser, MD;  Location: [facility] ENDOSCOPY;  Service: Cardiovascular;  Laterality: N/A;  . CARDIOVERSION N/A 03/01/2018   Procedure: CARDIOVERSION;  Surgeon: Elouise Munroe, MD;  Location: [facility] ENDOSCOPY;  Service: Cardiovascular;  Laterality: N/A;  . CARDIOVERSION N/A 04/06/2018   Procedure: CARDIOVERSION;  Surgeon: Thayer Headings, MD;  Location: [place];  Service: Cardiovascular;  Laterality: N/A;  . COLONOSCOPY  ~ 2015  . SKIN BIOPSY Left 03/30/2018   squamous cell carcinoma in situ  . WISDOM TOOTH EXTRACTION          Home Medications    Prior to Admission medications   Medication Sig Start Date End Date Taking? Authorizing Provider  carvedilol (COREG) 6.25 MG tablet Take 1 tablet (6.25 mg total) by mouth 2 (two) times daily with a meal. 03/02/18   Baldwin Jamaica, PA-C  lisinopril-hydrochlorothiazide (PRINZIDE,ZESTORETIC) 10-12.5 MG tablet Take 1 tablet by mouth daily.    [provider]  pantoprazole (PROTONIX) 40 MG tablet Take 1 tablet (40 mg total) by mouth daily. 05/09/18 06/23/18  Baldwin Jamaica, PA-C  rivaroxaban (XARELTO) 20 MG TABS tablet Take 1 tablet (20 mg total) by mouth daily with supper. 10/13/17   Denita Lung, MD    Family History Family History  Problem Relation Age of Onset  . Stroke Father   . Heart disease Father   . Colon cancer Neg Hx   . Esophageal cancer Neg Hx   . Stomach cancer Neg Hx   . Rectal cancer Neg Hx     Social History Social History   Tobacco Use  .  Smoking status: Former Smoker    Packs/day: 0.75    Years: 5.00    Pack years: 3.75    Types: Cigarettes    Last attempt to quit: 05/20/2013    Years since quitting: 4.9  . Smokeless tobacco: Never Used  Substance Use Topics  . Alcohol use: Not Currently  . Drug use: Not Currently     Allergies   Patient has no known allergies.   Review of Systems Review of Systems  Constitutional: Negative for chills, diaphoresis and fever.  Respiratory: Negative  for shortness of breath.   Cardiovascular: Negative for chest pain.  Gastrointestinal: Negative for abdominal pain, nausea and vomiting.  Skin: Positive for color change (ecchymosis to R groin).  Neurological: Negative for dizziness, syncope, weakness, light-headedness and numbness.  All other systems reviewed and are negative.    Physical Exam Updated Vital Signs BP 121/82   Pulse (!) 52   Temp 97.9 F (36.6 C) (Oral)   Resp 18   SpO2 98%   Physical Exam Vitals signs and nursing note reviewed. Exam conducted with a chaperone present.  Constitutional:      General: He is not in acute distress.    Appearance: He is well-developed. He is not toxic-appearing.  HENT:     Head: Normocephalic and atraumatic.  Eyes:     General:        Right eye: No discharge.        Left eye: No discharge.     Conjunctiva/sclera: Conjunctivae normal.  Neck:     Musculoskeletal: Neck supple.  Cardiovascular:     Rate and Rhythm: Regular rhythm. Bradycardia present.     Pulses:          Femoral pulses are 2+ on the right side and 2+ on the left side.      Dorsalis pedis pulses are 2+ on the right side and 2+ on the left side.       Posterior tibial pulses are 2+ on the right side and 2+ on the left side.     Heart sounds: No murmur.  Pulmonary:     Effort: Pulmonary effort is normal. No respiratory distress.     Breath sounds: Normal breath sounds. No wheezing, rhonchi or rales.  Abdominal:     General: There is no distension.     Palpations: Abdomen is soft.     Tenderness: There is no abdominal tenderness.  Genitourinary:    Scrotum/Testes:        Right: Mass or tenderness not present.        Left: Mass or tenderness not present.  Musculoskeletal:     Comments: Patient has significant area of ecchymosis to the R groin which extends to the inner thigh, suprapubic region, and the superior aspect of the scrotum as pictured below. No palpable hematoma. Not erythematous or warm to the touch.  Intact AROM to the hips, knees, and ankles bilaterally. No significant tenderness to palpation. NVI distally.   Skin:    General: Skin is warm and dry.     Findings: No rash.  Neurological:     Mental Status: He is alert.     Comments: Clear speech. Sensation grossly intact to bilateral lower extremities. 5/5 symmetric strength with plantar/dorsiflexion bilaterally. Ambulatory.   Psychiatric:        Behavior: Behavior normal.      ED Treatments / Results  Labs (all labs ordered are listed, but only abnormal results are displayed) Labs Reviewed  BASIC  METABOLIC PANEL - Abnormal; Notable for the following components:      Result Value   Glucose, Bld 176 (*)    Creatinine, Ser 1.30 (*)    All other components within normal limits  PROTIME-INR - Abnormal; Notable for the following components:   Prothrombin Time 20.4 (*)    All other components within normal limits  CBC    EKG EKG Interpretation  Date/Time:  Tuesday May 15 2018 00:32:38 EST Ventricular Rate:  50 PR Interval:    QRS Duration: 104 QT Interval:  469 QTC Calculation: 428 R Axis:   57 Text Interpretation:  Sinus rhythm No significant change was found Confirmed by Ezequiel Essex [phone]) on 05/15/2018 12:41:44 AM   Radiology Ct Angio Abd/pel W And/or Wo Contrast  Result Date: 05/15/2018 CLINICAL DATA:  Bleeding from the right groin after catheterization. EXAM: CTA ABDOMEN AND PELVIS wITHOUT AND WITH CONTRAST TECHNIQUE: Multidetector CT imaging of the abdomen and pelvis was performed using the standard protocol during bolus administration of intravenous contrast. Multiplanar reconstructed images and MIPs were obtained and reviewed to evaluate the vascular anatomy. CONTRAST:  15mL ISOVUE-370 IOPAMIDOL (ISOVUE-370) INJECTION 76% COMPARISON:  None. FINDINGS: VASCULAR Aorta: Normal caliber aorta without aneurysm, dissection, vasculitis or significant stenosis. Celiac: Patent without evidence of aneurysm, dissection,  vasculitis or significant stenosis. SMA: Patent without evidence of aneurysm, dissection, vasculitis or significant stenosis. Renals: Both renal arteries are patent without evidence of aneurysm, dissection, vasculitis, fibromuscular dysplasia or significant stenosis. IMA: Patent without evidence of aneurysm, dissection, vasculitis or significant stenosis. Inflow: Patent without evidence of aneurysm, dissection, vasculitis or significant stenosis. Proximal Outflow: Bilateral common femoral and visualized portions of the superficial and profunda femoral arteries are patent without evidence of aneurysm, dissection, vasculitis or significant stenosis. Veins: No obvious venous abnormality within the limitations of this arterial phase study. Minimal soft tissue infiltration in the right groin region likely sequela of catheterization. No evidence of hematoma, pseudoaneurysm, or AV fistula. Review of the MIP images confirms the above findings. NON-VASCULAR Lower chest: Lung bases are clear. Hepatobiliary: No focal liver abnormality is seen. No gallstones, gallbladder wall thickening, or biliary dilatation. Pancreas: Unremarkable. No pancreatic ductal dilatation or surrounding inflammatory changes. Spleen: Normal in size without focal abnormality. Adrenals/Urinary Tract: Adrenal glands are unremarkable. Kidneys are normal, without renal calculi, focal lesion, or hydronephrosis. Bladder is unremarkable. Stomach/Bowel: Stomach is within normal limits. Appendix appears normal. No evidence of bowel wall thickening, distention, or inflammatory changes. Lymphatic: No significant lymphadenopathy. Reproductive: Prostate is unremarkable. Other: No abdominal wall hernia or abnormality. No abdominopelvic ascites. Musculoskeletal: No acute or significant osseous findings. IMPRESSION: VASCULAR No evidence of aortic aneurysm or dissection. No evidence of hematoma or pseudoaneurysm in the right groin. NON-VASCULAR No acute process  demonstrated in the abdomen or pelvis. NON-VASCULAR Electronically Signed   By: Lucienne Capers M.D.   On: 05/15/2018 02:09    Procedures Procedures (including critical care time)  Medications Ordered in ED Medications - No data to display   Initial Impression / Assessment and Plan / ED Course  I have reviewed the triage vital signs and the nursing notes.  Pertinent labs & imaging results that were available during my care of the patient were reviewed by me and considered in my medical decision making (see chart for details).   Patient presents to the emergency department status with complaints of right groin ecchymosis status post catheter ablation procedure for atrial fibrillation 05/08/18.  Patient nontoxic-appearing, no apparent distress, vitals notable for bradycardia.  Patient is on carvedilol and was discharged with a heart rate in the 50s for his procedure last week.  EKG shows sinus bradycardia, does not appear consistent with return of atrial fibrillation or heart block.  No associated chest pain, dyspnea, lightheadedness, or dizziness.  Regarding his right groin: He does have notable ecchymosis as described and pictured above.  No significant erythema or warmth to suggest infectious process.  He has good active range of motion throughout the hip.  The area is not significantly tender.  He has good palpable femoral and DP/PT pulses. NVI distally.  Labs per triage have been reviewed: Notable for stable hemoglobin/hematocrit from prior labs on record.  His creatinine is mildly elevated this will require PCP recheck.  Discussed with supervising physician Dr. Wyvonnia Dusky who has personally evaluated the patient, need to evaluate for pseduoaneurysm- ideally with [place], but this is not available at this facility at this time, CT angio of the abdomen/pelvis obtained, this was re-assuring with no evidence of aortic aneurysm or dissection. No evidence of hematoma or pseudoaneurysm in the right groin. No  acute process demonstrated in the abdomen or pelvis. Re-discussed with Dr. Wyvonnia Dusky, with palpable pulses and re-assuring CT scan recommends discharge home with return for outpatient [place] for pseudoaneurysm evaluation. Plan carried out as discussed. I discussed results, treatment plan, need for follow-up, and return precautions with the patient. Provided opportunity for questions, patient confirmed understanding and is in agreement with plan.    Final Clinical Impressions(s) / ED Diagnoses   Final diagnoses:  Post-operative state    ED Discharge Orders         Ordered    VAS [place] GROIN PSEUDOANEURYSM     05/15/18 0226           Amaryllis Dyke, PA-C 05/15/18 0236    Ezequiel Essex, MD 05/15/18 [phone]

## 2018-05-15 NOTE — Discharge Instructions (Addendum)
You were seen in the emergency department today for bruising around the catheter site from your procedure last week.  Your lab work showed that your hemoglobin has not significantly changed.  Your creatinine, measure of kidney function is mildly elevated, this to something have rechecked by your primary care provider within 1 to 2 weeks.  Your CT scan was normal without concerning findings.  Would like you to return to [facility] for an ultrasound to be absolutely sure there is not a blood vessel issue.  Please see information below.  Please follow-up with Dr. Rayann Heman within the next 3 to 5 days.  Return to the ER immediately should you experience new or worsening symptoms including but not limited to worsening bruising, worsening swelling, pain to the area, numbness or tingling or weakness to the leg, or any other concerns.   IMPORTANT PATIENT INSTRUCTIONS:  You have been scheduled for an Outpatient Vascular Study at [facility].    If tomorrow is a Saturday, Sunday or holiday, please go to the [facility] Emergency Department Registration Desk at 8 am tomorrow morning and tell them you are there for a vascular study.  If tomorrow is a weekday (Monday-Friday), please go to [facility] Admitting Department at 8 am and tell them  you are there for a vascular study.

## 2018-05-29 ENCOUNTER — Encounter: Payer: Self-pay | Admitting: Psychiatry

## 2018-05-29 ENCOUNTER — Ambulatory Visit (INDEPENDENT_AMBULATORY_CARE_PROVIDER_SITE_OTHER): Payer: 59 | Admitting: Psychiatry

## 2018-05-29 DIAGNOSIS — F4323 Adjustment disorder with mixed anxiety and depressed mood: Secondary | ICD-10-CM | POA: Diagnosis not present

## 2018-05-29 NOTE — Progress Notes (Signed)
      [facility] Counselor/Therapist Progress Note  Patient ID: Daniel Romero, MRN: [id number],    Date: 05/29/2018  Time Spent: 45 minutes   Treatment Type: Individual Therapy  Reported Symptoms: anxiety, discouraged  Mental Status Exam:  Appearance:   Casual and Well Groomed     Behavior:  Appropriate  Motor:  Normal  Speech/Language:   Clear and Coherent  Affect:  Appropriate  Mood:  anxious and sad  Thought process:  normal  Thought content:    WNL  Sensory/Perceptual disturbances:    WNL  Orientation:  oriented to person, place, time/date and situation  Attention:  Good  Concentration:  Good  Memory:  WNL  Fund of knowledge:   Good  Insight:    Good  Judgment:   Good  Impulse Control:  Good   Risk Assessment: Danger to Self:  No Self-injurious Behavior: No Danger to Others: No Duty to Warn:no Physical Aggression / Violence:No  Access to Firearms a concern: No  Gang Involvement:No   Subjective: The client reports that his parents are not doing well.  Both of his parents are in their late 86s still living in their home.  His older sister comes up from [place] and on daily basis to care for his parents.  She is with him during the day the client is with him at night.  His father is in the middle of congestive heart failure.  The client feels like he is not much longer for this world.  It has been difficult due to the conflict he is having with his sister.  His sister seems to be angry and better that she "does all the work."  The client understands because he is at work all day he cannot do everything she does.  He is trying not to engage in power struggles with her.  The client has had conversations with both of his parents about his failure as his son and not caring for them properly.  They have expressed forgiveness towards him which the client has found to give him relief. Since I last saw the client he has developed some medical issues.  He is being treated  for atrial fibrillation.  He originally had a cardioversion which did not work.  Then he was treated with a heart ablation which has helped keep him out of A. fib.  This is all significantly increased the client's stress level.  He is working on trying to adjust his diet to improve his health. The client's main goals is to reduce his anxiety and be able to manage the stress in his life more effectively.  Interventions: Solution-Oriented/Positive Psychology and Insight-Oriented  Diagnosis:   ICD-10-CM   1. Adjustment disorder with mixed anxiety and depressed mood F43.23     Plan: Stress reduction, positive self talk, self-care, diet, exercise.  This record has been created using [employer].  Chart creation errors have been sought, but Toneisha Savary not always have been located and corrected. Such creation errors do not reflect on the standard of medical care.   Corah Willeford, [place]

## 2018-06-06 ENCOUNTER — Ambulatory Visit (HOSPITAL_COMMUNITY): Payer: 59 | Admitting: Nurse Practitioner

## 2018-06-26 ENCOUNTER — Encounter (HOSPITAL_COMMUNITY): Payer: Self-pay

## 2018-07-03 ENCOUNTER — Encounter: Payer: Self-pay | Admitting: Psychiatry

## 2018-07-03 ENCOUNTER — Other Ambulatory Visit: Payer: Self-pay

## 2018-07-03 ENCOUNTER — Ambulatory Visit (INDEPENDENT_AMBULATORY_CARE_PROVIDER_SITE_OTHER): Payer: 59 | Admitting: Psychiatry

## 2018-07-03 DIAGNOSIS — F4323 Adjustment disorder with mixed anxiety and depressed mood: Secondary | ICD-10-CM

## 2018-07-03 NOTE — Progress Notes (Signed)
[facility] Counselor/Therapist Progress Note  Patient ID: Valentin Benney, MRN: [id number],    Date: 07/03/2018  Time Spent: 50 minutes   Treatment Type: Individual Therapy  Reported Symptoms: anxiety, grief sadness  Mental Status Exam:  Appearance:   Casual     Behavior:  Appropriate  Motor:  Normal  Speech/Language:   Clear and Coherent  Affect:  Appropriate  Mood:  anxious, sad and grief  Thought process:  normal  Thought content:    WNL  Sensory/Perceptual disturbances:    WNL  Orientation:  oriented to person, place, time/date and situation  Attention:  Good  Concentration:  Good  Memory:  WNL  Fund of knowledge:   Good  Insight:    Good  Judgment:   Good  Impulse Control:  Good   Risk Assessment: Danger to Self:  No Self-injurious Behavior: No Danger to Others: No Duty to Warn:no Physical Aggression / Violence:No  Access to Firearms a concern: No  Gang Involvement:No   Subjective: I connected with patient by a video enabled telemedicine application or telephone, with their informed consent, and verified patient privacy and that I am speaking with the correct person using two identifiers.  I was located at [place] and patient at home. 1 month ago the client lost his dad.  His dad was having trouble with being able to walk and when he got out of bed to use the restroom client sister could not get him up off the floor.  He was admitted to [facility] where once he was stabilized he would be sent to rehab.  Client stated that he stayed and talked with his dad and both he and his sister left with the understanding they would come back in the morning to help with the moved to rehab.  His dad died that night.  He feels bad that he was not able to be there but he does agree that everything had been cleared between them and he was glad about that. The client is the executor of the estate.  Since his mother inherited everything he cannot do  anything until she passes.  She has dementia and is not competent to make any decisions so there can be no power of attorney signed on Mondays moved until her demise.  He is fine with that.  He has had some conflict with his sister over the estate but wants the lawyer explained that everything was under the clients control and she relented. The client is reevaluating his own life.  His job in [place] at [employer] is continuing.  He describes today on his appointment that he has shortness of breath.  I advised the client to contact his primary care physician today and alerted him to those symptoms.  He said that he would. The client had gone to the funeral and talked to the interim pastor of his dad's church.  He has decided that he Am[place] Handley start visiting there.  He will also follow-up with his friends from [place].  He stays home important it is to develop a better community.   Interventions: Solution-Oriented/Positive Psychology and Insight-Oriented  Diagnosis:   ICD-10-CM   1. Adjustment disorder with mixed anxiety and depressed mood F43.23     Plan: Grief work, self care, AA.  This record has been created using [employer].  Chart creation errors have been sought, but Thales Knipple not always have been located and corrected. Such creation errors do not reflect on  the standard of medical care.   Charlyn Vialpando, [facility]

## 2018-07-05 ENCOUNTER — Encounter (HOSPITAL_COMMUNITY): Payer: Self-pay | Admitting: Nurse Practitioner

## 2018-07-05 ENCOUNTER — Other Ambulatory Visit: Payer: Self-pay

## 2018-07-05 ENCOUNTER — Ambulatory Visit (HOSPITAL_COMMUNITY)
Admission: RE | Admit: 2018-07-05 | Discharge: 2018-07-05 | Disposition: A | Discharge status: Home / Self Care | Payer: PRIVATE HEALTH INSURANCE | Source: Ambulatory Visit | Attending: Nurse Practitioner | Admitting: Nurse Practitioner

## 2018-07-05 VITALS — BP 142/80 | HR 59 | Ht 70.0 in | Wt 236.6 lb

## 2018-07-05 DIAGNOSIS — Z6833 Body mass index (BMI) 33.0-33.9, adult: Secondary | ICD-10-CM | POA: Diagnosis not present

## 2018-07-05 DIAGNOSIS — Z79899 Other long term (current) drug therapy: Secondary | ICD-10-CM | POA: Diagnosis not present

## 2018-07-05 DIAGNOSIS — Z85828 Personal history of other malignant neoplasm of skin: Secondary | ICD-10-CM | POA: Insufficient documentation

## 2018-07-05 DIAGNOSIS — I491 Atrial premature depolarization: Secondary | ICD-10-CM | POA: Diagnosis not present

## 2018-07-05 DIAGNOSIS — I10 Essential (primary) hypertension: Secondary | ICD-10-CM | POA: Insufficient documentation

## 2018-07-05 DIAGNOSIS — Z8249 Family history of ischemic heart disease and other diseases of the circulatory system: Secondary | ICD-10-CM | POA: Diagnosis not present

## 2018-07-05 DIAGNOSIS — R0602 Shortness of breath: Secondary | ICD-10-CM | POA: Diagnosis not present

## 2018-07-05 DIAGNOSIS — I4819 Other persistent atrial fibrillation: Secondary | ICD-10-CM | POA: Insufficient documentation

## 2018-07-05 DIAGNOSIS — I429 Cardiomyopathy, unspecified: Secondary | ICD-10-CM | POA: Insufficient documentation

## 2018-07-05 DIAGNOSIS — R0609 Other forms of dyspnea: Secondary | ICD-10-CM

## 2018-07-05 DIAGNOSIS — Z7901 Long term (current) use of anticoagulants: Secondary | ICD-10-CM | POA: Insufficient documentation

## 2018-07-05 DIAGNOSIS — E669 Obesity, unspecified: Secondary | ICD-10-CM | POA: Diagnosis not present

## 2018-07-05 DIAGNOSIS — Z87891 Personal history of nicotine dependence: Secondary | ICD-10-CM | POA: Insufficient documentation

## 2018-07-05 NOTE — Progress Notes (Signed)
Primary Care Physician: Daniel Lung, MD Referring Physician: Dr. Val Riles [name] is a 58 y.o. male with a h/o HTN, asymptomatic, newly diagnosed afib in August of this year being referred to Dr. Gwenlyn Romero at that time. He was cardioverted,12/01/17, after sufficient time of being anticoagulanted. This was successful but he was back in afib on f/u with Dr. Gwenlyn Romero.Echo showed an EF of 40-45%. It was discussed with pt even thought he is asymptomatic his reduced EF may be 2/2 to [facility] and it would be beneficial to try to restore SR going forward. He currently does not drink or smoke. Mod caffeine use. Denies a snoring history.   F/u in afib clinic, 12/10. He is here for Tikosyn admit. He stopped lisinopril/hctz last Thursday. He is aware of cost of drug and can afford.   F/u 12/30. Unfortunately, pt failed Tikosyn to restore SR  and this was stopped and he was loaded on Amiodarone. He is now on amiodarone  X 2 weeks, continues in rate controlled afib, and hopefully this will be a bridge to ablation down the road.   F/u afib clinic 04/12/18. S/p DCCV 04/06/18 which was initially successful but he reports early return to afib the next morning. He did notice a mild improvement in his overall wellbeing when he was in normal rhythm. He has continued on amiodarone 200 mg BID and Xarelto 20 mg daily with no missed doses.  F/u in afib clinic 07/05/18. He asked to be seen as he was seeing his counselor yesterday and he mentioned he had had some shortness of breath. He ia about 6 weeks ost ablation. His counselor said that he needed to be seen right away as a PA that had worked with him, c/o of shortness of breath and died a few days later. He only notices it going up steps carrying boxes as he has been trying to organize some things.. He doe not notice any PND or orthopnea. No pedal edema. He has had around 10 lb wight gain but his father died around one month ago and he admits to stress/comfort eating in the  last few weeks.   Today, he denies symptoms of palpitations, chest pain, orthopnea, PND, lower extremity edema, dizziness, presyncope, syncope, or neurologic sequela. The patient is tolerating medications without difficulties and is otherwise without complaint today.   Past Medical History:  Diagnosis Date  . Anginal pain ([place])   . Chronic systolic dysfunction of left ventricle   . Depression   . Hypertension   . Obesity   . Persistent atrial fibrillation   . Seasonal allergies   . Skin cancer 04/2018   BACK   Past Surgical History:  Procedure Laterality Date  . ATRIAL FIBRILLATION ABLATION  05/08/2018  . ATRIAL FIBRILLATION ABLATION N/A 05/08/2018   Procedure: ATRIAL FIBRILLATION ABLATION;  Surgeon: Daniel Grayer, MD;  Location: [place] [facility];  Service: Cardiovascular;  Laterality: N/A;  . CARDIOVERSION N/A 12/01/2017   Procedure: CARDIOVERSION;  Surgeon: Daniel Dresser, MD;  Location: [facility] ENDOSCOPY;  Service: Cardiovascular;  Laterality: N/A;  . CARDIOVERSION N/A 03/01/2018   Procedure: CARDIOVERSION;  Surgeon: Daniel Munroe, MD;  Location: [facility] ENDOSCOPY;  Service: Cardiovascular;  Laterality: N/A;  . CARDIOVERSION N/A 04/06/2018   Procedure: CARDIOVERSION;  Surgeon: Daniel Headings, MD;  Location: [place];  Service: Cardiovascular;  Laterality: N/A;  . COLONOSCOPY  ~ 2015  . SKIN BIOPSY Left 03/30/2018   squamous cell carcinoma in situ  . WISDOM  TOOTH EXTRACTION      Current Outpatient Medications  Medication Sig Dispense Refill  . carvedilol (COREG) 6.25 MG tablet Take 1 tablet (6.25 mg total) by mouth 2 (two) times daily with a meal. 60 tablet 6  . lisinopril-hydrochlorothiazide (PRINZIDE,ZESTORETIC) 10-12.5 MG tablet Take 1 tablet by mouth daily.    . rivaroxaban (XARELTO) 20 MG TABS tablet Take 1 tablet (20 mg total) by mouth daily with supper. (Patient taking differently: Take 20 mg by mouth daily. ) 90 tablet 3   No current facility-administered  medications for this encounter.     No Known Allergies  Social History   Socioeconomic History  . Marital status: Single    Spouse name: Not on file  . Number of children: Not on file  . Years of education: Not on file  . Highest education level: Not on file  Occupational History  . Not on file  Social Needs  . Financial resource strain: Not on file  . Food insecurity:    Worry: Not on file    Inability: Not on file  . Transportation needs:    Medical: Not on file    Non-medical: Not on file  Tobacco Use  . Smoking status: Former Smoker    Packs/day: 0.75    Years: 5.00    Pack years: 3.75    Types: Cigarettes    Last attempt to quit: 05/20/2013    Years since quitting: 5.1  . Smokeless tobacco: Never Used  Substance and Sexual Activity  . Alcohol use: Not Currently  . Drug use: Not Currently  . Sexual activity: Yes  Lifestyle  . Physical activity:    Days per week: Not on file    Minutes per session: Not on file  . Stress: Not on file  Relationships  . Social connections:    Talks on phone: Not on file    Gets together: Not on file    Attends religious service: Not on file    Active member of club or organization: Not on file    Attends meetings of clubs or organizations: Not on file    Relationship status: Not on file  . Intimate partner violence:    Fear of current or ex partner: Not on file    Emotionally abused: Not on file    Physically abused: Not on file    Forced sexual activity: Not on file  Other Topics Concern  . Not on file  Social History Narrative  . Not on file    Family History  Problem Relation Age of Onset  . Stroke Father   . Heart disease Father   . Colon cancer Neg Hx   . Esophageal cancer Neg Hx   . Stomach cancer Neg Hx   . Rectal cancer Neg Hx     ROS- All systems are reviewed and negative except as per the HPI above  Physical Exam: Vitals:   07/05/18 1423  BP: (!) 142/80  Pulse: (!) 59  Weight: 107.3 kg  Height: 5'  10" (1.778 m)   Wt Readings from Last 3 Encounters:  07/05/18 107.3 kg  05/09/18 102 kg  04/13/18 104.6 kg    Labs: Lab Results  Component Value Date   NA 137 05/14/2018   K 3.8 05/14/2018   CL 103 05/14/2018   CO2 24 05/14/2018   GLUCOSE 176 (H) 05/14/2018   BUN 17 05/14/2018   CREATININE 1.30 (H) 05/14/2018   CALCIUM 8.9 05/14/2018   MG 2.0 03/02/2018  Lab Results  Component Value Date   INR 1.8 05/14/2018   Lab Results  Component Value Date   CHOL 193 10/13/2017   HDL 58 10/13/2017   LDLCALC 112 (H) 10/13/2017   TRIG 114 10/13/2017     GEN- The patient is well appearing, alert and oriented x 3 today.   HEENT-head normocephalic, atraumatic, sclera clear, conjunctiva pink, hearing intact, trachea midline. Lungs- Clear to ausculation bilaterally, normal work of breathing Heart- irregular rate and rhythm, no murmurs, rubs or gallops  GI- soft, NT, ND, + BS Extremities- no clubbing, cyanosis, or edema MS- no significant deformity or atrophy Skin- no rash or lesion Psych- euthymic mood, full affect Neuro- strength and sensation are intact   EKG- sinus brady at 59 bpm, pr int 135 ms, qrs int 92 ms, qtc 413 ms Echo-Study Conclusions  - Left ventricle: The cavity size was normal. Wall thickness was   increased in a pattern of mild LVH. Systolic function was mildly   to moderately reduced. The estimated ejection fraction was in the   range of 40% to 45%. Diffuse hypokinesis. There was no evidence   of elevated ventricular filling pressure by Doppler parameters. - Left atrium: The atrium was mildly dilated. Volume/bsa, ES,   (1-plane Simpson&'s, A2C): 35.3 ml/m^2. Left atrium 41 mm  Assessment and Plan: 1. Persistent  Afib Failed Tikosyn for failing to restore SR. He loaded on amiodarone as a bridge to ablation which was done 05/08/18 He has been in SR since then as EKG continues to show SR today Continue Xarelto 20 mg daily This patients CHA2DS2-VASc Score  and unadjusted Ischemic Stroke Rate (% per year) is equal to 2.2 % stroke rate/year from a score of 2  Above score calculated as 1 point each if present [CHF, HTN, DM, Vascular=MI/PAD/Aortic Plaque, Age if 65-74, or Male] Above score calculated as 2 points each if present [Age > 75, or Stroke/TIA/TE]  2. Exertional shortness of breath Only has noted with going up steps carrying boxes No PND/orthopnea No pedal edema Has had a 10 lb weight gain but he had been stress eating since the death of his father 4 weeks ago and appears to be true weight gain PO 96% He admits that he became very concerned with the story of his counselor's co worker dying and had shortness of breath as his symptom(may have been Covid, test results pending) He was reassured that the shortness of breath he is describing sounds what I can hear s/p ablation He was encouraged to modify his diet and start a daily exercise program  2. HTN Stable, no changes today.  3. Cardiomyopathy EF 40-45%, likely afib related. Will reassess once he is stable in SR.  F/u with Dr Daniel Romero  For 3 month  f/u ablation 5/18  [facility] [address] [place], [place] [address] [phone]

## 2018-07-05 NOTE — Addendum Note (Signed)
Encounter addended by: Sherran Needs, NP on: 07/05/2018 3:33 PM  Actions taken: Visit diagnoses modified

## 2018-07-09 ENCOUNTER — Telehealth (HOSPITAL_COMMUNITY): Payer: 59 | Admitting: Nurse Practitioner

## 2018-07-25 ENCOUNTER — Encounter: Payer: Self-pay | Admitting: Cardiology

## 2018-07-25 ENCOUNTER — Telehealth: Payer: Self-pay

## 2018-07-25 ENCOUNTER — Telehealth (INDEPENDENT_AMBULATORY_CARE_PROVIDER_SITE_OTHER): Payer: PRIVATE HEALTH INSURANCE | Admitting: Cardiology

## 2018-07-25 VITALS — Ht 71.0 in | Wt 230.0 lb

## 2018-07-25 DIAGNOSIS — Z7901 Long term (current) use of anticoagulants: Secondary | ICD-10-CM

## 2018-07-25 DIAGNOSIS — I428 Other cardiomyopathies: Secondary | ICD-10-CM

## 2018-07-25 DIAGNOSIS — I4819 Other persistent atrial fibrillation: Secondary | ICD-10-CM

## 2018-07-25 DIAGNOSIS — I429 Cardiomyopathy, unspecified: Secondary | ICD-10-CM

## 2018-07-25 DIAGNOSIS — R0602 Shortness of breath: Secondary | ICD-10-CM

## 2018-07-25 DIAGNOSIS — I480 Paroxysmal atrial fibrillation: Secondary | ICD-10-CM

## 2018-07-25 DIAGNOSIS — R0600 Dyspnea, unspecified: Secondary | ICD-10-CM | POA: Diagnosis not present

## 2018-07-25 DIAGNOSIS — I10 Essential (primary) hypertension: Secondary | ICD-10-CM

## 2018-07-25 NOTE — Telephone Encounter (Signed)
Contacted patient to discuss AVS instructions. Patient agreeable with Luke recommendations and voiced understanding. 

## 2018-07-25 NOTE — Progress Notes (Signed)
Virtual Visit via Video Note   This visit type was conducted due to national recommendations for restrictions regarding the COVID-19 Pandemic (e.g. social distancing) in an effort to limit this patient's exposure and mitigate transmission in our community.  Due to his co-morbid illnesses, this patient is at least at moderate risk for complications without adequate follow up.  This format is felt to be most appropriate for this patient at this time.  All issues noted in this document were discussed and addressed.  A limited physical exam was performed with this format.  Please refer to the patient's chart for his consent to telehealth for [facility].   Date:  07/25/2018   ID:  Daniel Romero, DOB 20-Sep-1960, MRN [id number]  Patient Location: Home Provider Location: Home  PCP:  Denita Lung, MD  Cardiologist:  Quay Burow, MD  Electrophysiologist:  Thompson Grayer, MD   Evaluation Performed:  Follow-Up Visit  Chief Complaint:  SOB  History of Present Illness:    Daniel Romero is a 58 y.o. male with a history of atrial fibrillation and presumed related cardiomyopathy.  He was initially evaluated by Dr. Gwenlyn Found in August 2019.  He failed to hold sinus rhythm on amiodarone and Tikosyn.  He underwent atrial fibrillation ablation in February 2020 by Dr. Rayann Heman.  He has maintained sinus rhythm since.  He was seen in the atrial fibrillation clinic in April.  He complained of some vague shortness of breath and dyspnea on exertion.  He was in sinus rhythm then.  He is also had some weight gain but he attributes that to being at home and under some stress recently, besides the [place] pandemic and his employment situation, apparently his father recently passed and he has been under some stress.  He denies orthopnea or lower extremity edema.  His main complaint is vague sensation of shortness of breath with exertion, he attributes this to being out of shape and overweight.  The patient does  not have symptoms concerning for COVID-19 infection (fever, chills, cough, or new shortness of breath).    Past Medical History:  Diagnosis Date  . Anginal pain ([place])   . Chronic systolic dysfunction of left ventricle   . Depression   . Hypertension   . Obesity   . Persistent atrial fibrillation   . Seasonal allergies   . Skin cancer 04/2018   BACK   Past Surgical History:  Procedure Laterality Date  . ATRIAL FIBRILLATION ABLATION  05/08/2018  . ATRIAL FIBRILLATION ABLATION N/A 05/08/2018   Procedure: ATRIAL FIBRILLATION ABLATION;  Surgeon: Thompson Grayer, MD;  Location: [place] [facility];  Service: Cardiovascular;  Laterality: N/A;  . CARDIOVERSION N/A 12/01/2017   Procedure: CARDIOVERSION;  Surgeon: Larey Dresser, MD;  Location: [facility] ENDOSCOPY;  Service: Cardiovascular;  Laterality: N/A;  . CARDIOVERSION N/A 03/01/2018   Procedure: CARDIOVERSION;  Surgeon: Elouise Munroe, MD;  Location: [facility] ENDOSCOPY;  Service: Cardiovascular;  Laterality: N/A;  . CARDIOVERSION N/A 04/06/2018   Procedure: CARDIOVERSION;  Surgeon: Thayer Headings, MD;  Location: [place];  Service: Cardiovascular;  Laterality: N/A;  . COLONOSCOPY  ~ 2015  . SKIN BIOPSY Left 03/30/2018   squamous cell carcinoma in situ  . WISDOM TOOTH EXTRACTION       Current Meds  Medication Sig  . carvedilol (COREG) 6.25 MG tablet Take 1 tablet (6.25 mg total) by mouth 2 (two) times daily with a meal.  . lisinopril-hydrochlorothiazide (PRINZIDE,ZESTORETIC) 10-12.5 MG tablet Take 1 tablet by  mouth daily.  . rivaroxaban (XARELTO) 20 MG TABS tablet Take 1 tablet (20 mg total) by mouth daily with supper. (Patient taking differently: Take 20 mg by mouth daily. )     Allergies:   Patient has no known allergies.   Social History   Tobacco Use  . Smoking status: Former Smoker    Packs/day: 0.75    Years: 5.00    Pack years: 3.75    Types: Cigarettes    Last attempt to quit: 05/20/2013    Years since quitting: 5.1  .  Smokeless tobacco: Never Used  Substance Use Topics  . Alcohol use: Not Currently  . Drug use: Not Currently     Family Hx: The patient's family history includes Heart disease in his father; Stroke in his father. There is no history of Colon cancer, Esophageal cancer, Stomach cancer, or Rectal cancer.  ROS:   Please see the history of present illness.    All other systems reviewed and are negative. He denies significant snoring, he says he sleeps well and denies daytime fatigue   Prior CV studies:   The following studies were reviewed today:   Labs/Other Tests and Data Reviewed:    EKG:  An ECG dated 07/05/2018 was personally reviewed today and demonstrated:  NSR, PAC  Recent Labs: 03/02/2018: ALT 28; Magnesium 2.0; TSH 2.128 05/14/2018: BUN 17; Creatinine, Ser 1.30; Hemoglobin 15.1; Platelets 308; Potassium 3.8; Sodium 137   Recent Lipid Panel Lab Results  Component Value Date/Time   CHOL 193 10/13/2017 04:20 PM   TRIG 114 10/13/2017 04:20 PM   HDL 58 10/13/2017 04:20 PM   CHOLHDL 3.3 10/13/2017 04:20 PM   CHOLHDL 3.5 07/21/2016 02:35 PM   LDLCALC 112 (H) 10/13/2017 04:20 PM    Wt Readings from Last 3 Encounters:  07/25/18 230 lb (104.3 kg)  07/05/18 236 lb 9.6 oz (107.3 kg)  05/09/18 224 lb 13.9 oz (102 kg)     Objective:    Vital Signs:  Ht 5\' 11"  (1.803 m)   Wt 230 lb (104.3 kg)   BMI 32.08 kg/m   Pt in no distress on the phone, not SOB with conversation VITAL SIGNS:  reviewed  ASSESSMENT & PLAN:    DOE- Roderic Palau indicated this may be post RFA.  He does not appear to be in CHF.    PAF- S/p RFA feb 2020 after he failed to hold NSR on Amio and Tikosyn  Anticoagulated- On Xarelto  Cardiomyopathy- Presumable secondary to atrial fibrillation- check f/u echo for LVF  HTN- No B/P today but this has been controlled   COVID-19 Education: The signs and symptoms of COVID-19 were discussed with the patient and how to seek care for testing (follow up  with PCP or arrange E-visit).  The importance of social distancing was discussed today.  Time:   Today, I have spent 15 minutes with the patient with telehealth technology discussing the above problems.     Medication Adjustments/Labs and Tests Ordered: Current medicines are reviewed at length with the patient today.  Concerns regarding medicines are outlined above.   Tests Ordered: No orders of the defined types were placed in this encounter.   Medication Changes: No orders of the defined types were placed in this encounter.   Disposition:  Follow up Check echo for LVF.  If his DOE persists consider a course of diuretics.  I advised him to be careful about his sodium intake.  F/U Dr Gwenlyn Found in the Fall and Dr  Allred as scheduled.   Signed, Kerin Ransom, PA-C  07/25/2018 3:52 PM    [facility]

## 2018-07-25 NOTE — Addendum Note (Signed)
Addended by: Ulice Brilliant T on: 07/25/2018 05:09 PM   Modules accepted: Orders

## 2018-07-25 NOTE — Patient Instructions (Addendum)
Medication Instructions:  Your physician recommends that you continue on your current medications as directed. Please refer to the Current Medication list given to you today. If you need a refill on your cardiac medications before your next appointment, please call your pharmacy.   Lab work: None  If you have labs (blood work) drawn today and your tests are completely normal, you will receive your results only by: [name] MyChart Message (if you have MyChart) OR . A paper copy in the mail If you have any lab test that is abnormal or we need to change your treatment, we will call you to review the results.  Testing/Procedures: Your physician has requested that you have an echocardiogram. Echocardiography is a painless test that uses sound waves to create images of your heart. It provides your doctor with information about the size and shape of your heart and how well your heart's chambers and valves are working. This procedure takes approximately one hour. There are no restrictions for this procedure. Please schedule test for 1-2 weeks from today TEST IS COMPLETED AT [address] [address]  Follow-Up: At [facility], you and your health needs are our priority.  As part of our continuing mission to provide you with exceptional heart care, we have created designated Provider Care Teams.  These Care Teams include your primary Cardiologist (physician) and Advanced Practice Providers (APPs -  Physician Assistants and Nurse Practitioners) who all work together to provide you with the care you need, when you need it. You will need a follow up appointment in 3-4 months. You may see Quay Burow, MD or one of the following Advanced Practice Providers on your designated Care Team:   Kerin Ransom, PA-C Roby Lofts, [place] . Sande Rives, PA-C  Any Other Special Instructions Will Be Listed Below (If Applicable).

## 2018-07-27 ENCOUNTER — Telehealth (HOSPITAL_COMMUNITY): Payer: Self-pay | Admitting: Radiology

## 2018-07-27 NOTE — Telephone Encounter (Signed)

## 2018-07-30 ENCOUNTER — Other Ambulatory Visit: Payer: Self-pay

## 2018-07-30 ENCOUNTER — Ambulatory Visit (HOSPITAL_COMMUNITY): Payer: PRIVATE HEALTH INSURANCE | Attending: Cardiology

## 2018-07-30 DIAGNOSIS — I4819 Other persistent atrial fibrillation: Secondary | ICD-10-CM

## 2018-07-30 DIAGNOSIS — I10 Essential (primary) hypertension: Secondary | ICD-10-CM | POA: Diagnosis not present

## 2018-07-30 DIAGNOSIS — I428 Other cardiomyopathies: Secondary | ICD-10-CM

## 2018-07-30 DIAGNOSIS — Z7901 Long term (current) use of anticoagulants: Secondary | ICD-10-CM

## 2018-07-30 DIAGNOSIS — R0602 Shortness of breath: Secondary | ICD-10-CM | POA: Diagnosis present

## 2018-08-06 ENCOUNTER — Ambulatory Visit: Payer: 59 | Admitting: Internal Medicine

## 2018-08-17 ENCOUNTER — Ambulatory Visit: Payer: 59 | Admitting: Internal Medicine

## 2018-08-23 ENCOUNTER — Other Ambulatory Visit: Payer: Self-pay

## 2018-08-23 ENCOUNTER — Encounter: Payer: Self-pay | Admitting: Psychiatry

## 2018-08-23 ENCOUNTER — Ambulatory Visit (INDEPENDENT_AMBULATORY_CARE_PROVIDER_SITE_OTHER): Payer: 59 | Admitting: Psychiatry

## 2018-08-23 DIAGNOSIS — F4321 Adjustment disorder with depressed mood: Secondary | ICD-10-CM

## 2018-08-23 NOTE — Progress Notes (Signed)
      [facility] Counselor/Therapist Progress Note  Patient ID: Daniel Romero, MRN: [id number],    Date: 08/23/2018  Time Spent: 50 minutes   Treatment Type: Individual Therapy  Reported Symptoms: anger, sadness, irritability  Mental Status Exam:  Appearance:   Casual and Well Groomed     Behavior:  Appropriate  Motor:  Normal  Speech/Language:   Clear and Coherent  Affect:  Appropriate  Mood:  angry, irritable and sad  Thought process:  normal  Thought content:    WNL  Sensory/Perceptual disturbances:    WNL  Orientation:  oriented to person, place, time/date and situation  Attention:  Good  Concentration:  Good  Memory:  WNL  Fund of knowledge:   Good  Insight:    Good  Judgment:   Good  Impulse Control:  Good   Risk Assessment: Danger to Self:  No Self-injurious Behavior: No Danger to Others: No Duty to Warn:no Physical Aggression / Violence:No  Access to Firearms a concern: No  Gang Involvement:No   Subjective: I met with the client face-to-face.  We both had facemasks. The client reports that his A. fib continues to be under control.  He knows he needs to lose weight to lower his blood pressure which will help his A. fib.  He discussed his continued conflict that he has with his younger sister over his father's estate and the care of his mother. I used eye-movement with the client today focusing on the conflict with his sister.  His negative cognition is, "I am judged.  I must bow down."  He feels anger and frustration in his chest.  His subjective units of distress is a 7.  As the client processed using eye-movement he became angrier and angrier.  He feels that his younger sister tries to order him around.  He feels that her bitterness and anger from childhood gets projected on to him.  He feels caught between a rock and a hard place.  She covers for him when he goes to [place] to visit his girlfriend every other weekend.  We discussed that just because she  says something does not make it true.  The client agreed.  He fell back on his faith and realized that he was letting his ego get in the way.  His positive cognition at the end of the session was, "I can control me."  His subjective units of distress dropped to less than 1.  Interventions: Assertiveness/Communication, Motivational Interviewing, Solution-Oriented/Positive Psychology, [employer] Desensitization and Reprocessing (EMDR) and Insight-Oriented  Diagnosis:   ICD-10-CM   1. Adjustment disorder with depressed mood F43.21     Plan: Positive self talk, patience, radical acceptance, mood independent behavior.  This record has been created using [employer].  Chart creation errors have been sought, but Ilijah Doucet not always have been located and corrected. Such creation errors do not reflect on the standard of medical care.  Derin Matthes, [facility]

## 2018-09-06 ENCOUNTER — Telehealth: Payer: Self-pay

## 2018-09-07 NOTE — Telephone Encounter (Signed)
Left message regarding appt on 09/10/18.

## 2018-09-10 ENCOUNTER — Telehealth (INDEPENDENT_AMBULATORY_CARE_PROVIDER_SITE_OTHER): Payer: 59 | Admitting: Internal Medicine

## 2018-09-10 ENCOUNTER — Encounter: Payer: Self-pay | Admitting: Internal Medicine

## 2018-09-10 VITALS — Ht 71.0 in | Wt 230.0 lb

## 2018-09-10 DIAGNOSIS — I10 Essential (primary) hypertension: Secondary | ICD-10-CM | POA: Diagnosis not present

## 2018-09-10 DIAGNOSIS — I4819 Other persistent atrial fibrillation: Secondary | ICD-10-CM

## 2018-09-10 DIAGNOSIS — I428 Other cardiomyopathies: Secondary | ICD-10-CM | POA: Diagnosis not present

## 2018-09-10 NOTE — Progress Notes (Signed)
Electrophysiology TeleHealth Note   Due to national recommendations of social distancing due to COVID 19, an audio/video telehealth visit is felt to be most appropriate for this patient at this time.  See MyChart message from today for the patient's consent to telehealth for [facility].   Date:  09/10/2018   ID:  Daniel Romero, DOB May 31, 1960, MRN [id number]  Location: patient's home  Provider location:  [facility]  Evaluation Performed: Follow-up visit  PCP:  Denita Lung, MD   Electrophysiologist:  Dr Rayann Heman  Chief Complaint:  palpitations  History of Present Illness:    Daniel Romero is a 58 y.o. male who presents via telehealth conferencing today.  Since his ablation, the patient reports doing very well. Denies procedure related complications.  No afib.  + brief palpitations. Today, he denies symptoms of chest pain, shortness of breath,  lower extremity edema, dizziness, presyncope, or syncope.  The patient is otherwise without complaint today.  The patient denies symptoms of fevers, chills, cough, or new SOB worrisome for COVID 19.  Past Medical History:  Diagnosis Date  . Anginal pain ([place])   . Chronic systolic dysfunction of left ventricle   . Depression   . Hypertension   . Obesity   . Persistent atrial fibrillation   . Seasonal allergies   . Skin cancer 04/2018   BACK    Past Surgical History:  Procedure Laterality Date  . ATRIAL FIBRILLATION ABLATION  05/08/2018  . ATRIAL FIBRILLATION ABLATION N/A 05/08/2018   Procedure: ATRIAL FIBRILLATION ABLATION;  Surgeon: Thompson Grayer, MD;  Location: [place] CV LAB;  Service: Cardiovascular;  Laterality: N/A;  . CARDIOVERSION N/A 12/01/2017   Procedure: CARDIOVERSION;  Surgeon: Larey Dresser, MD;  Location: [facility] ENDOSCOPY;  Service: Cardiovascular;  Laterality: N/A;  . CARDIOVERSION N/A 03/01/2018   Procedure: CARDIOVERSION;  Surgeon: Elouise Munroe, MD;  Location: [facility] ENDOSCOPY;  Service:  Cardiovascular;  Laterality: N/A;  . CARDIOVERSION N/A 04/06/2018   Procedure: CARDIOVERSION;  Surgeon: Thayer Headings, MD;  Location: [place];  Service: Cardiovascular;  Laterality: N/A;  . COLONOSCOPY  ~ 2015  . SKIN BIOPSY Left 03/30/2018   squamous cell carcinoma in situ  . WISDOM TOOTH EXTRACTION      Current Outpatient Medications  Medication Sig Dispense Refill  . carvedilol (COREG) 6.25 MG tablet Take 1 tablet (6.25 mg total) by mouth 2 (two) times daily with a meal. 60 tablet 6  . lisinopril-hydrochlorothiazide (PRINZIDE,ZESTORETIC) 10-12.5 MG tablet Take 1 tablet by mouth daily.    . rivaroxaban (XARELTO) 20 MG TABS tablet Take 1 tablet (20 mg total) by mouth daily with supper. (Patient taking differently: Take 20 mg by mouth daily. ) 90 tablet 3   No current facility-administered medications for this visit.     Allergies:   Patient has no known allergies.   Social History:  The patient  reports that he quit smoking about 5 years ago. His smoking use included cigarettes. He has a 3.75 pack-year smoking history. He has never used smokeless tobacco. He reports previous alcohol use. He reports previous drug use.   Family History:  The patient's family history includes Heart disease in his father; Stroke in his father.   ROS:  Please see the history of present illness.   All other systems are personally reviewed and negative.    Exam:    Vital Signs:  Ht 5\' 11"  (1.803 m)   Wt 230 lb (104.3 kg)   BMI  32.08 kg/m   Well appearing, NAD, OP clear, normal WOB   Labs/Other Tests and Data Reviewed:    Recent Labs: 03/02/2018: ALT 28; Magnesium 2.0; TSH 2.128 05/14/2018: BUN 17; Creatinine, Ser 1.30; Hemoglobin 15.1; Platelets 308; Potassium 3.8; Sodium 137   Wt Readings from Last 3 Encounters:  09/10/18 230 lb (104.3 kg)  07/25/18 230 lb (104.3 kg)  07/05/18 236 lb 9.6 oz (107.3 kg)     ASSESSMENT & PLAN:    1.  Persistent afib Doing great post ablation off AAD  therapy chads2vasc score is 1 (HTN) Continue xarelto for 3 more months  2. HTN Stable No change required today  3. Tachycardia mediated CM Resolved with sinus rhythm  (echo 07/2018 reviewed with him today)  4. Obesity Fatty liver noted on cardiac CT (04/2018) reviewed with him today  Follow-up:  3 months with me   Patient Risk:  after full review of this patients clinical status, I feel that they are at moderate risk at this time.  Today, I have spent 15 minutes with the patient with telehealth technology discussing arrhythmia management .    Army Fossa, MD  09/10/2018 8:20 AM     [facility] [address] [place] [address] [phone] (office) [phone] (fax)

## 2018-09-18 ENCOUNTER — Other Ambulatory Visit: Payer: Self-pay | Admitting: Physician Assistant

## 2018-10-02 ENCOUNTER — Other Ambulatory Visit: Payer: Self-pay | Admitting: Physician Assistant

## 2018-10-06 ENCOUNTER — Other Ambulatory Visit: Payer: Self-pay | Admitting: Family Medicine

## 2018-10-06 DIAGNOSIS — I4891 Unspecified atrial fibrillation: Secondary | ICD-10-CM

## 2018-10-09 ENCOUNTER — Encounter: Payer: Self-pay | Admitting: Psychiatry

## 2018-10-09 ENCOUNTER — Other Ambulatory Visit: Payer: Self-pay

## 2018-10-09 ENCOUNTER — Ambulatory Visit (INDEPENDENT_AMBULATORY_CARE_PROVIDER_SITE_OTHER): Payer: 59 | Admitting: Psychiatry

## 2018-10-09 DIAGNOSIS — F4321 Adjustment disorder with depressed mood: Secondary | ICD-10-CM | POA: Diagnosis not present

## 2018-10-09 NOTE — Progress Notes (Signed)
      [facility] Counselor/Therapist Progress Note  Patient ID: Daniel Romero, MRN: [id number],    Date: 10/09/2018  Time Spent: 50 minutes   Treatment Type: Individual Therapy  Reported Symptoms: sad  Mental Status Exam:  Appearance:   Casual     Behavior:  Appropriate  Motor:  Normal  Speech/Language:   Clear and Coherent  Affect:  Appropriate  Mood:  sad  Thought process:  normal  Thought content:    WNL  Sensory/Perceptual disturbances:    WNL  Orientation:  oriented to person, place, time/date and situation  Attention:  Good  Concentration:  Good  Memory:  WNL  Fund of knowledge:   Good  Insight:    Good  Judgment:   Good  Impulse Control:  Good   Risk Assessment: Danger to Self:  No Self-injurious Behavior: No Danger to Others: No Duty to Warn:no Physical Aggression / Violence:No  Access to Firearms a concern: No  Gang Involvement:No   Subjective: The client states, "I am under a lot of stress."  The client is having to consider whether or not to put his mother in a nursing home.  Her level of dementia has gotten worse but the clients younger sister has quit giving him time off on the weekend.  "She is ratcheted down the free time."  The client has been in an ongoing conflict with his sister over the care of his mother and over the finances of the estate.  He has gone so far as to speak to the pastor of the local [facility]. The client has had a lot of irritability towards his sister.  The pastor pointed out that he had unforgiveness to which the client agreed.  I used the bilateral stimulation hand paddles for the client as he worked on decreasing his irritability and unforgiveness towards his sister.  I explained to the client that forgiveness is not active his will and not necessarily a feeling.  The benefit is that it frees him up.  He agreed and will work on doing this.  He will also consult with their lawyer to find out what is going to take to get  guardianship so he can get his mother into a facility. I encouraged the client to attend some [place] meetings.  I also asked him to start exercising more regularly.  He has noticed that his heart has been acting up lately.  He has an appointment tomorrow for physical.  He will discuss all of this with his PCP as well.  Interventions: Assertiveness/Communication, Motivational Interviewing, Solution-Oriented/Positive Psychology, [employer] Group Desensitization and Reprocessing (EMDR) and Insight-Oriented  Diagnosis:   ICD-10-CM   1. Adjustment disorder with depressed mood  F43.21     Plan: Exercise, positive self talk, mood independent behavior, forgiveness, discussion with his primary care physician after his physical.  This record has been created using [employer].  Chart creation errors have been sought, but Tameaka Eichhorn not always have been located and corrected. Such creation errors do not reflect on the standard of medical care.  Antoneo Ghrist, [facility]

## 2018-10-16 ENCOUNTER — Other Ambulatory Visit: Payer: Self-pay

## 2018-10-16 ENCOUNTER — Encounter: Payer: Self-pay | Admitting: Family Medicine

## 2018-10-16 ENCOUNTER — Ambulatory Visit: Payer: 59 | Admitting: Family Medicine

## 2018-10-16 VITALS — BP 116/78 | HR 73 | Temp 98.2°F | Ht 69.5 in | Wt 231.8 lb

## 2018-10-16 DIAGNOSIS — Z7901 Long term (current) use of anticoagulants: Secondary | ICD-10-CM

## 2018-10-16 DIAGNOSIS — R7309 Other abnormal glucose: Secondary | ICD-10-CM

## 2018-10-16 DIAGNOSIS — Z0000 Encounter for general adult medical examination without abnormal findings: Secondary | ICD-10-CM

## 2018-10-16 DIAGNOSIS — I428 Other cardiomyopathies: Secondary | ICD-10-CM | POA: Diagnosis not present

## 2018-10-16 DIAGNOSIS — F1011 Alcohol abuse, in remission: Secondary | ICD-10-CM

## 2018-10-16 DIAGNOSIS — I10 Essential (primary) hypertension: Secondary | ICD-10-CM | POA: Diagnosis not present

## 2018-10-16 DIAGNOSIS — Z833 Family history of diabetes mellitus: Secondary | ICD-10-CM

## 2018-10-16 DIAGNOSIS — F439 Reaction to severe stress, unspecified: Secondary | ICD-10-CM

## 2018-10-16 LAB — POCT GLYCOSYLATED HEMOGLOBIN (HGB A1C): Hemoglobin A1C: 5.6 % (ref 4.0–5.6)

## 2018-10-16 NOTE — Progress Notes (Signed)
Established Patient Office Visit  Subjective:  Patient ID: Daniel Romero, male    DOB: 1960-09-23  Age: 58 y.o. MRN: [id number]  HPI German Manke presents for annual physical. Concerns today include: Recheck heart rhythm. Has a phm of afib and has been in rhythm since his cardioversion earlier this year. He is currently on rivaroxaban. Saw a cardiologist in April, 2020. Last couple weeks, he's noticed subtle flutters in his chest when he concentrates on his heart rhythm throughout the day; they do not seem to be tied to his activity level. No SOB on exertion, chest pain, abdominal pain, dizziness, or feeling of weakness. He has a PMH of depression in the 90s. He states "this is the worst year of my life, but I'm dealing with it" due to many pressures from family, work, and medical conditions. States no suicidal ideation or loss of interest in daily activities. Sleeps well, gets about 5 hrs a night. Sees counselor Josph Macho May from [place], once a month. States that he is happy with his therapy. No allergies. His hypertension is managed with lisinopril and carvedilol. No swelling of legs or unusual fatigue. No alcohol. No smoking. Caffeine: cups of coffee a day. Diet: high in carbs; could use improvement. Exercise: none   Past Medical History:  Diagnosis Date  . Anginal pain ([place])   . Chronic systolic dysfunction of left ventricle   . Depression   . Hypertension   . Obesity   . Persistent atrial fibrillation   . Seasonal allergies   . Skin cancer 04/2018   BACK    Past Surgical History:  Procedure Laterality Date  . ATRIAL FIBRILLATION ABLATION  05/08/2018  . ATRIAL FIBRILLATION ABLATION N/A 05/08/2018   Procedure: ATRIAL FIBRILLATION ABLATION;  Surgeon: Thompson Grayer, MD;  Location: [employer] CV LAB;  Service: Cardiovascular;  Laterality: N/A;  . CARDIOVERSION N/A 12/01/2017   Procedure: CARDIOVERSION;  Surgeon: Larey Dresser, MD;  Location: [facility] ENDOSCOPY;   Service: Cardiovascular;  Laterality: N/A;  . CARDIOVERSION N/A 03/01/2018   Procedure: CARDIOVERSION;  Surgeon: Elouise Munroe, MD;  Location: [facility] ENDOSCOPY;  Service: Cardiovascular;  Laterality: N/A;  . CARDIOVERSION N/A 04/06/2018   Procedure: CARDIOVERSION;  Surgeon: Thayer Headings, MD;  Location: [place];  Service: Cardiovascular;  Laterality: N/A;  . COLONOSCOPY  ~ 2015  . SKIN BIOPSY Left 03/30/2018   squamous cell carcinoma in situ  . WISDOM TOOTH EXTRACTION      Family History  Problem Relation Age of Onset  . Stroke Father   . Heart disease Father   . Colon cancer Neg Hx   . Esophageal cancer Neg Hx   . Stomach cancer Neg Hx   . Rectal cancer Neg Hx   Mother and father: CHF Diabetes: Father    Outpatient Medications Prior to Visit  Medication Sig Dispense Refill  . carvedilol (COREG) 6.25 MG tablet TAKE 1 TABLET BY MOUTH TWICE DAILY WITH A MEAL 60 tablet 11  . lisinopril-hydrochlorothiazide (PRINZIDE,ZESTORETIC) 10-12.5 MG tablet Take 1 tablet by mouth daily.    Alveda Reasons 20 MG TABS tablet TAKE 1 TABLET(20 MG) BY MOUTH DAILY WITH SUPPER 90 tablet 3   No facility-administered medications prior to visit.     No Known Allergies  ROS Review of Systems  Constitutional: Negative for fever and malaise/fatigue.  HENT: Negative for sore throat.   Respiratory: Negative for cough.   Cardiovascular: Positive for palpitations. Negative for chest pain.  Gastrointestinal: Negative for abdominal  pain and diarrhea.  Musculoskeletal: Negative for myalgias.  Skin: Negative for rash.  Neurological: Negative for weakness.  Psychiatric/Behavioral: Negative for suicidal ideas.    Review of Systems  Constitutional: Negative for fever and malaise/fatigue.  HENT: Negative for sore throat.   Respiratory: Negative for cough.   Cardiovascular: Positive for palpitations. Negative for chest pain.  Gastrointestinal: Negative for abdominal pain and diarrhea.  Musculoskeletal:  Negative for myalgias.  Skin: Negative for rash.  Neurological: Negative for weakness.  Psychiatric/Behavioral: Negative for suicidal ideas.       Objective:    Physical Exam  Constitutional: He is oriented to person, place, and time. He appears well-developed and well-nourished.  HENT:  Head: Normocephalic and atraumatic.  Right Ear: External ear normal.  Left Ear: External ear normal.  Nose: Nose normal.  Mouth/Throat: Oropharynx is clear and moist.  Eyes: Pupils are equal, round, and reactive to light. Conjunctivae and EOM are normal.  Neck: Normal range of motion. Neck supple.  Cardiovascular: Normal rate, regular rhythm, normal heart sounds and intact distal pulses.  Pulmonary/Chest: Effort normal and breath sounds normal.  Abdominal: Soft. Bowel sounds are normal.  Musculoskeletal: Normal range of motion.  Neurological: He is alert and oriented to person, place, and time. He has normal reflexes.  Skin: Skin is warm and dry.  Psychiatric: His behavior is normal. Thought content normal.       Assessment & Plan:   Routine general medical examination at a health care facility -  Other cardiomyopathy [facility]) - Plan: Follow-up with cardiology.  Essential hypertension - Plan: Continue present meds  History of alcohol abuse -nothing  Chronic anticoagulation - Plan: Continue Xarelto.  Elevated glucose - Plan: POCT glycosylated hemoglobin (Hb A1C), 5.6.  I explained that he is not diabetic.  Stress - Plan: Continue in counseling.  Family history of diabetes mellitus -   Recommended regular exercise.  Also discussed checking his pulse and explained to him after actually check his pulse the next time he feels palpitations.      Ozella Almond, Medical Student

## 2018-10-26 ENCOUNTER — Ambulatory Visit: Payer: PRIVATE HEALTH INSURANCE | Admitting: Cardiovascular Disease

## 2018-10-29 ENCOUNTER — Other Ambulatory Visit: Payer: Self-pay | Admitting: Family Medicine

## 2018-11-06 ENCOUNTER — Ambulatory Visit: Payer: 59 | Admitting: Psychiatry

## 2018-11-16 ENCOUNTER — Ambulatory Visit (INDEPENDENT_AMBULATORY_CARE_PROVIDER_SITE_OTHER): Payer: 59 | Admitting: Psychiatry

## 2018-11-16 ENCOUNTER — Encounter: Payer: Self-pay | Admitting: Psychiatry

## 2018-11-16 ENCOUNTER — Other Ambulatory Visit: Payer: Self-pay

## 2018-11-16 DIAGNOSIS — F4321 Adjustment disorder with depressed mood: Secondary | ICD-10-CM | POA: Diagnosis not present

## 2018-11-16 NOTE — Progress Notes (Signed)
      [facility] Counselor/Therapist Progress Note  Patient ID: Daniel Romero, MRN: [id number],    Date: 11/16/2018  Time Spent: 50 minutes   Treatment Type: Individual Therapy  Reported Symptoms: sad  Mental Status Exam:  Appearance:   Casual     Behavior:  Appropriate  Motor:  Normal  Speech/Language:   Clear and Coherent  Affect:  Appropriate  Mood:  sad  Thought process:  normal  Thought content:    WNL  Sensory/Perceptual disturbances:    WNL  Orientation:  oriented to person, place, time/date and situation  Attention:  Good  Concentration:  Good  Memory:  WNL  Fund of knowledge:   Good  Insight:    Good  Judgment:   Good  Impulse Control:  Good   Risk Assessment: Danger to Self:  No Self-injurious Behavior: No Danger to Others: No Duty to Warn:no Physical Aggression / Violence:No  Access to Firearms a concern: No  Gang Involvement:No   Subjective: The client states that his mom died 1 week ago.  She had fallen and broken her hip and ended up in the hospital.  The client was still in a state of disbelief.  As he spoke today he talked about having lost all the people that knew him as a child and there was no one "underneath him." The client was concerned that the hospital had ushured his mom along toward death.  She was given small doses of morphine and then they stopped giving her fluids.  I explained to the client that typically as the body starts to shut down continue to give fluids only prolongs the process.  His mom was feeling no pain and left quietly and peacefully. The client accepted this and felt better about his mom's death.  He states that if he goes into his dads shop he can break down and cry.  I encouraged the client to allow himself to do so.  It is all part of the grief process. 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  We discussed boundaries and making sure  that he gets a checklist from the estate lawyer.  He felt like that would be a good move so that he can get things done in an efficient manner.  He will continue with self-care and developing more assertiveness with good boundaries. I use the bilateral stimulation hand paddles with the client.  His subjective use of distress with his mom's death started at a 7 and was less than 2 dissection.  His positive cognition at the end of the session was, "I can manage this."  Interventions: Assertiveness/Communication, Motivational Interviewing, Solution-Oriented/Positive Psychology, [employer] Group Desensitization and Reprocessing (EMDR) and Insight-Oriented  Diagnosis:   ICD-10-CM   1. Adjustment disorder with depressed mood  F43.21     Plan: Boundaries, assertiveness, self-care,.  Zeya Balles, [facility]

## 2018-12-07 ENCOUNTER — Telehealth: Payer: 59 | Admitting: Internal Medicine

## 2018-12-11 ENCOUNTER — Ambulatory Visit (INDEPENDENT_AMBULATORY_CARE_PROVIDER_SITE_OTHER): Payer: 59 | Admitting: Psychiatry

## 2018-12-11 ENCOUNTER — Other Ambulatory Visit: Payer: Self-pay

## 2018-12-11 ENCOUNTER — Encounter: Payer: Self-pay | Admitting: Psychiatry

## 2018-12-11 DIAGNOSIS — F4321 Adjustment disorder with depressed mood: Secondary | ICD-10-CM

## 2018-12-11 NOTE — Progress Notes (Signed)
      [facility] Counselor/Therapist Progress Note  Patient ID: Daniel Romero, MRN: [id number],    Date: 12/11/2018  Time Spent: 50 minutes   Treatment Type: Individual Therapy  Reported Symptoms: sad  Mental Status Exam:  Appearance:   Casual     Behavior:  Appropriate  Motor:  Normal  Speech/Language:   Clear and Coherent  Affect:  Appropriate  Mood:  irritable and sad  Thought process:  normal  Thought content:    WNL  Sensory/Perceptual disturbances:    WNL  Orientation:  oriented to person, place, time/date and situation  Attention:  Good  Concentration:  Good  Memory:  WNL  Fund of knowledge:   Good  Insight:    Good  Judgment:   Good  Impulse Control:  Good   Risk Assessment: Danger to Self:  No Self-injurious Behavior: No Danger to Others: No Duty to Warn:no Physical Aggression / Violence:No  Access to Firearms a concern: No  Gang Involvement:No   Subjective: The client states that his older cousin has been coming by once a week to have pizza with him.  His cousin works at [employer].  He is a "confirmed bachelor" according to the client.  His cousin's mother years ago had done some needlework and given the client a large hand made doily.  The client had it bleached, starched and framed.  He eventually gave it to his mother.  The cousin asked him if he could have it.  The client considered it and said okay because his cousin's mother had made it.  Later he stated he did not feel good about it and told his cousin that he could not give it to him.  As it turns out the client's sister had given the cousin the framed  doily weeks ago.  The client became very irritated that he was being played by his cousin.  He did get the doily returned to him. The client discussed the difficulty in settling his parents estate and especially closing their house.  "I have so many warm memories from that house in the backyard with the trees and the stream."  The client  currently lives in his grandmother's house about a mile down the road.  I suggested to the client that he might buy his sister's half out by the sale of his grandmother's house so that he could own and live in the parent's house.  He will consider this.  He does not think his sister is invested in the house but just the money. The client brought up his current relationship which he knows is not sustainable.  She lives in [place] and he travels there every other weekend.  He would like to address this at his next session.  He will try to be assertive with his sister and set the appropriate boundaries with his cousin.  I used eye-movement with the client to help reduce his anger with his cousin from the subjective units of distress of 6 to less than 2 at the end of the session.  Interventions: Assertiveness/Communication, Motivational Interviewing, Solution-Oriented/Positive Psychology, [employer] Group Desensitization and Reprocessing (EMDR) and Insight-Oriented  Diagnosis:   ICD-10-CM   1. Adjustment disorder with depressed mood  F43.21     Plan: Assertiveness, boundaries, self-care, financial considerations with his parents estate.  Rex Magee, [facility]

## 2018-12-25 ENCOUNTER — Other Ambulatory Visit: Payer: Self-pay

## 2018-12-25 ENCOUNTER — Ambulatory Visit: Payer: 59 | Admitting: Cardiovascular Disease

## 2018-12-25 ENCOUNTER — Encounter: Payer: Self-pay | Admitting: Cardiovascular Disease

## 2018-12-25 DIAGNOSIS — I10 Essential (primary) hypertension: Secondary | ICD-10-CM

## 2018-12-25 DIAGNOSIS — I4819 Other persistent atrial fibrillation: Secondary | ICD-10-CM

## 2018-12-25 NOTE — Assessment & Plan Note (Signed)
History of essential hypertension her blood pressure measured today 130/82.  He is on carvedilol, lisinopril and hydrochlorothiazide.

## 2018-12-25 NOTE — Patient Instructions (Signed)
Medication Instructions:  Your physician recommends that you continue on your current medications as directed. Please refer to the Current Medication list given to you today.  If you need a refill on your cardiac medications before your next appointment, please call your pharmacy.   Lab work: none If you have labs (blood work) drawn today and your tests are completely normal, you will receive your results only by: [name] MyChart Message (if you have MyChart) OR . A paper copy in the mail If you have any lab test that is abnormal or we need to change your treatment, we will call you to review the results.  Testing/Procedures: none  Follow-Up: At [facility], you and your health needs are our priority.  As part of our continuing mission to provide you with exceptional heart care, we have created designated Provider Care Teams.  These Care Teams include your primary Cardiologist (physician) and Advanced Practice Providers (APPs -  Physician Assistants and Nurse Practitioners) who all work together to provide you with the care you need, when you need it. . You will need a follow up appointment in 6 months with an APP and in 12 months with Dr. Quay Burow.  Please call our office 2 months in advance to schedule this/each appointment.  You may see one of the following Advanced Practice Providers on your designated Care Team:   . Kerin Ransom, PA-C . Daleen Snook Kroeger, PA-C . Sande Rives, PA-C ____________________ . Almyra Deforest, PA-C . Fabian Sharp, PA-C . Jory Sims, DNP . Rosaria Ferries, PA-C

## 2018-12-25 NOTE — Assessment & Plan Note (Signed)
History of atrial fibrillation status post failed Tikosyn and amiodarone ultimately requiring A. fib ablation by Dr. Rayann Heman 05/09/2019 maintaining sinus rhythm on Xarelto.

## 2018-12-25 NOTE — Progress Notes (Signed)
12/25/2018 [name]   07-Jun-1960  [id number]  Primary Physician Denita Lung, MD Primary Cardiologist: Lorretta Harp MD Lupe Carney, [place]  HPI:  Daniel Romero is a 58 y.o.  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 Redmond School for cardiovascular evaluation because of newly recognized A. fib.   I last saw him in the office 11/08/2017.  He does have a history of treated hypertension. He smoked remotely and drank remotely as well stopped 7 years ago. He is never had a heart attack or stroke. There is no family history. He is not aware that he is in A. fib. He was begun on Xarelto.  I arrange for him to undergo outpatient cardioversion performed by Dr. Aundra Dubin successfully 12/01/2017.  He really cannot tell a difference although I suspect he is back in A. fib today.  He did have a 2D echocardiogram performed 11/07/2017 revealing an EF of 40 to 45% with mild left atrial dilatation.  This most recent 2D echo performed 07/25/2018 revealed normal LV systolic function.  Ultimately referred him to Dr. Rayann Heman for evaluation of his A. fib.  He failed amiodarone and Tikosyn and ultimately had atrial fibrillation by Dr. Rayann Heman 06/06/2018.  He is maintaining sinus rhythm on Xarelto oral anticoagulation.  Unfortunately, he lost both his parents this year who are in the mid 44s and he still grieving from this.  Current Meds  Medication Sig  . carvedilol (COREG) 6.25 MG tablet TAKE 1 TABLET BY MOUTH TWICE DAILY WITH A MEAL  . lisinopril-hydrochlorothiazide (ZESTORETIC) 10-12.5 MG tablet TAKE 1 TABLET BY MOUTH DAILY  . XARELTO 20 MG TABS tablet TAKE 1 TABLET(20 MG) BY MOUTH DAILY WITH SUPPER     No Known Allergies  Social History   Socioeconomic History  . Marital status: Single    Spouse name: Not on file  . Number of children: Not on file  . Years of education: Not on file  . Highest  education level: Not on file  Occupational History  . Not on file  Social Needs  . Financial resource strain: Not on file  . Food insecurity    Worry: Not on file    Inability: Not on file  . Transportation needs    Medical: Not on file    Non-medical: Not on file  Tobacco Use  . Smoking status: Former Smoker    Packs/day: 0.75    Years: 5.00    Pack years: 3.75    Types: Cigarettes    Quit date: 05/20/2013    Years since quitting: 5.6  . Smokeless tobacco: Never Used  Substance and Sexual Activity  . Alcohol use: Not Currently  . Drug use: Not Currently  . Sexual activity: Yes  Lifestyle  . Physical activity    Days per week: Not on file    Minutes per session: Not on file  . Stress: Not on file  Relationships  . Social Herbalist on phone: Not on file    Gets together: Not on file    Attends religious service: Not on file    Active member of club or organization: Not on file    Attends meetings of clubs or organizations: Not on file    Relationship status: Not on file  . Intimate partner violence    Fear of current or ex partner: Not on file    Emotionally abused:  Not on file    Physically abused: Not on file    Forced sexual activity: Not on file  Other Topics Concern  . Not on file  Social History Narrative  . Not on file     Review of Systems: General: negative for chills, fever, night sweats or weight changes.  Cardiovascular: negative for chest pain, dyspnea on exertion, edema, orthopnea, palpitations, paroxysmal nocturnal dyspnea or shortness of breath Dermatological: negative for rash Respiratory: negative for cough or wheezing Urologic: negative for hematuria Abdominal: negative for nausea, vomiting, diarrhea, bright red blood per rectum, melena, or hematemesis Neurologic: negative for visual changes, syncope, or dizziness All other systems reviewed and are otherwise negative except as noted above.    Blood pressure 130/82, pulse 60,  height 5\' 10"  (1.778 m), weight 243 lb 6.4 oz (110.4 kg), SpO2 97 %.  General appearance: alert and no distress Neck: no adenopathy, no carotid bruit, no JVD, supple, symmetrical, trachea midline and thyroid not enlarged, symmetric, no tenderness/mass/nodules Lungs: clear to auscultation bilaterally Heart: regular rate and rhythm, S1, S2 normal, no murmur, click, rub or gallop Extremities: extremities normal, atraumatic, no cyanosis or edema Pulses: 2+ and symmetric Skin: Skin color, texture, turgor normal. No rashes or lesions Neurologic: Alert and oriented X 3, normal strength and tone. Normal symmetric reflexes. Normal coordination and gait  EKG sinus rhythm at 60 without ST or T wave changes.  I personally reviewed this EKG.  ASSESSMENT AND PLAN:   Essential hypertension History of essential hypertension her blood pressure measured today 130/82.  He is on carvedilol, lisinopril and hydrochlorothiazide.  Persistent atrial fibrillation History of atrial fibrillation status post failed Tikosyn and amiodarone ultimately requiring A. fib ablation by Dr. Rayann Heman 05/09/2019 maintaining sinus rhythm on Xarelto.      Lorretta Harp MD FACP,FACC,FAHA, [facility] 12/25/2018 4:47 PM

## 2019-01-01 ENCOUNTER — Other Ambulatory Visit: Payer: Self-pay | Admitting: Family Medicine

## 2019-01-03 ENCOUNTER — Ambulatory Visit (INDEPENDENT_AMBULATORY_CARE_PROVIDER_SITE_OTHER): Payer: 59 | Admitting: Psychiatry

## 2019-01-03 ENCOUNTER — Other Ambulatory Visit: Payer: Self-pay

## 2019-01-03 ENCOUNTER — Encounter: Payer: Self-pay | Admitting: Psychiatry

## 2019-01-03 DIAGNOSIS — F4321 Adjustment disorder with depressed mood: Secondary | ICD-10-CM

## 2019-01-03 NOTE — Progress Notes (Signed)
      [facility] Counselor/Therapist Progress Note  Patient ID: Jullius Nastase, MRN: [id number],    Date: 01/03/2019  Time Spent: 50 minutes   Treatment Type: Individual Therapy  Reported Symptoms: sad  Mental Status Exam:  Appearance:   Casual     Behavior:  Appropriate  Motor:  Normal  Speech/Language:   Clear and Coherent  Affect:  Appropriate  Mood:  sad  Thought process:  normal  Thought content:    WNL  Sensory/Perceptual disturbances:    WNL  Orientation:  oriented to person, place, time/date and situation  Attention:  Good  Concentration:  Good  Memory:  WNL  Fund of knowledge:   Good  Insight:    Good  Judgment:   Good  Impulse Control:  Good   Risk Assessment: Danger to Self:  No Self-injurious Behavior: No Danger to Others: No Duty to Warn:no Physical Aggression / Violence:No  Access to Firearms a concern: No  Gang Involvement:No   Subjective: The client states that the lawyer has the paperwork ready for him to sign concerning the resolution of his parents estate.  Since the funeral he has had little contact with his sister.  We discussed last time his desire to buy his parents house which was the home he grew up in.  He stated that he does not have enough money to buy his sister out.  I discussed this with the client that he still Graviel Payeur be able to do it.  When the house goes up for sale he assumes the sale price will be around $80,000.  The client has a very good credit score and should be able to easily prequalified for a mortgage.  The monthly payment at a standard interest rate for that amount of mortgage is around $185.  The client was shocked and surprised.  I explained that there were other things that would go into that as well.  Either way the client realized it could be doable.  Once the house was sold or he bought it he would still get half of the purchase price as part of his inheritance.  He could then apply this to the cost of the house.  He owns  his grandmother's house Free and clear.  The sale of that house he could also apply to the current mortgage leaving him very little to pay off.  The client will talk to a mortgage banker and consider if he wants to take this on. The client also discussed his current relationship.  He knows it will need to come to an end.  He has a male friend here in town who is 57 years his senior that has been the "love of his life."  He states that he cannot abandon her (it is Warehouse manager) but his current girlfriend gets angry if he mentions her.  He states he has to lie to his girlfriend about going to see this other woman.  I asked the client what benefit he was getting from all the stress?  He agreed it was not getting any benefit.  He will reconsider how he navigates this relationship.  Interventions: Assertiveness/Communication, Motivational Interviewing, Solution-Oriented/Positive Psychology and Insight-Oriented  Diagnosis:   ICD-10-CM   1. Adjustment disorder with depressed mood  F43.21     Plan: Boundaries, assertiveness, meet with mortgage banker, reconsider relationship.  Betania Dizon, [facility]

## 2019-01-21 ENCOUNTER — Telehealth: Payer: Self-pay | Admitting: Family Medicine

## 2019-01-21 ENCOUNTER — Other Ambulatory Visit: Payer: Self-pay

## 2019-01-21 DIAGNOSIS — Z20822 Contact with and (suspected) exposure to covid-19: Secondary | ICD-10-CM

## 2019-01-21 NOTE — Telephone Encounter (Signed)
Pt called has been running a fever all weekend 99-101, little cough, only other sx body aches.  Stayed out of work today.  Wanted to know where to be tested [employer] for [facility] testing site.  And advised to call back if sx get worse or need virtual visit.

## 2019-01-22 LAB — NOVEL CORONAVIRUS, NAA: SARS-CoV-2, NAA: DETECTED — AB

## 2019-01-23 ENCOUNTER — Ambulatory Visit: Payer: 59 | Admitting: Family Medicine

## 2019-01-23 ENCOUNTER — Other Ambulatory Visit: Payer: Self-pay

## 2019-01-23 ENCOUNTER — Encounter: Payer: Self-pay | Admitting: Family Medicine

## 2019-01-23 VITALS — Temp 101.7°F | Wt 243.0 lb

## 2019-01-23 DIAGNOSIS — U071 COVID-19: Secondary | ICD-10-CM | POA: Diagnosis not present

## 2019-01-23 NOTE — Progress Notes (Signed)
   Subjective:    Patient ID: Daniel Romero, male    DOB: 1961-01-27, 58 y.o.   MRN: [id number]  HPI Documentation for virtual telephone encounter. Documentation for virtual audio and video telecommunications through Wall encounter: The patient was located at home. The provider was located in the office. The patient did consent to this visit and is aware of possible charges through their insurance for this visit. The other persons participating in this telemedicine service were none. This virtual service is not related to other E/M service within previous 7 days. He developed a fever last Monday as well as a slight cough.  And was checked for Covid.  The test was positive.  He is also noted smell and taste problems.  No sore throat, earache, shortness of breath.  He has been using Aleve for fever which has helped.     Review of Systems     Objective:   Physical Exam Alert and in no distress with a normal breathing pattern.       Assessment & Plan:  COVID-19 virus detected I discussed the diagnosis with him.  Discussed close contacts that he might have been around.  He states he has not been around anybody.  Cautioned that if he has increased difficulty with cough, shortness of breath and fever, further intervention may be necessary.  He will continue to take 2 Aleve twice per day for the fever as well as Tylenol.  Encouraged him to call me if he has any questions.  Discussed that after 10 days, if he is getting better and having no fever, he can resume normal activity.  He expressed understanding of this.

## 2019-02-06 ENCOUNTER — Telehealth (INDEPENDENT_AMBULATORY_CARE_PROVIDER_SITE_OTHER): Payer: 59 | Admitting: Internal Medicine

## 2019-02-06 VITALS — Ht 70.0 in | Wt 230.0 lb

## 2019-02-06 DIAGNOSIS — I4819 Other persistent atrial fibrillation: Secondary | ICD-10-CM

## 2019-02-06 DIAGNOSIS — I10 Essential (primary) hypertension: Secondary | ICD-10-CM

## 2019-02-06 NOTE — Progress Notes (Signed)
Electrophysiology TeleHealth Note   Due to national recommendations of social distancing due to COVID 19, an audio/video telehealth visit is felt to be most appropriate for this patient at this time.  See MyChart message from today for the patient's consent to telehealth for [facility].   Date:  02/06/2019   ID:  Daniel Romero, DOB 1960/08/20, MRN [id number]  Location: patient's home  Provider location:  [facility]  Evaluation Performed: Follow-up visit  PCP:  Denita Lung, MD   Electrophysiologist:  Dr Rayann Heman  Chief Complaint:  palpitations  History of Present Illness:    Daniel Romero is a 58 y.o. male who presents via telehealth conferencing today.  Since last being seen in our clinic, the patient reports doing very well.  He did have COVID 19 late October/ early November.  He remains mildly weak but is otherwise without symptoms.  Today, he denies symptoms of palpitations, chest pain, shortness of breath,  lower extremity edema, dizziness, presyncope, or syncope.  The patient is otherwise without complaint today.  The patient denies symptoms of fevers, chills, cough, or new SOB worrisome for COVID 19.  Past Medical History:  Diagnosis Date  . Anginal pain ([place])   . Chronic systolic dysfunction of left ventricle   . Depression   . Hypertension   . Obesity   . Persistent atrial fibrillation ([place])   . Seasonal allergies   . Skin cancer 04/2018   BACK    Past Surgical History:  Procedure Laterality Date  . ATRIAL FIBRILLATION ABLATION  05/08/2018  . ATRIAL FIBRILLATION ABLATION N/A 05/08/2018   Procedure: ATRIAL FIBRILLATION ABLATION;  Surgeon: Thompson Grayer, MD;  Location: [place] CV LAB;  Service: Cardiovascular;  Laterality: N/A;  . CARDIOVERSION N/A 12/01/2017   Procedure: CARDIOVERSION;  Surgeon: Larey Dresser, MD;  Location: [facility] ENDOSCOPY;  Service: Cardiovascular;  Laterality: N/A;  . CARDIOVERSION N/A 03/01/2018   Procedure:  CARDIOVERSION;  Surgeon: Elouise Munroe, MD;  Location: [facility] ENDOSCOPY;  Service: Cardiovascular;  Laterality: N/A;  . CARDIOVERSION N/A 04/06/2018   Procedure: CARDIOVERSION;  Surgeon: Thayer Headings, MD;  Location: [place];  Service: Cardiovascular;  Laterality: N/A;  . COLONOSCOPY  ~ 2015  . SKIN BIOPSY Left 03/30/2018   squamous cell carcinoma in situ  . WISDOM TOOTH EXTRACTION      Current Outpatient Medications  Medication Sig Dispense Refill  . carvedilol (COREG) 6.25 MG tablet TAKE 1 TABLET BY MOUTH TWICE DAILY WITH A MEAL 60 tablet 11  . lisinopril-hydrochlorothiazide (ZESTORETIC) 10-12.5 MG tablet TAKE 1 TABLET BY MOUTH DAILY 90 tablet 1  . XARELTO 20 MG TABS tablet TAKE 1 TABLET(20 MG) BY MOUTH DAILY WITH SUPPER 90 tablet 3   No current facility-administered medications for this visit.     Allergies:   Patient has no known allergies.   Social History:  The patient  reports that he quit smoking about 5 years ago. His smoking use included cigarettes. He has a 3.75 pack-year smoking history. He has never used smokeless tobacco. He reports previous alcohol use. He reports previous drug use.   Family History:  The patient's family history includes Diabetes in his father; Heart disease in his father and mother; Stroke in his father.   ROS:  Please see the history of present illness.   All other systems are personally reviewed and negative.    Exam:    Vital Signs:  Ht 5\' 10"  (1.778 m)   Wt 230 lb (  104.3 kg)   BMI 33.00 kg/m   Well sounding and appearing, alert and conversant, regular work of breathing,  good skin color Eyes- anicteric, neuro- grossly intact, skin- no apparent rash or lesions or cyanosis, mouth- oral mucosa is pink  Labs/Other Tests and Data Reviewed:    Recent Labs: 03/02/2018: ALT 28; Magnesium 2.0; TSH 2.128 05/14/2018: BUN 17; Creatinine, Ser 1.30; Hemoglobin 15.1; Platelets 308; Potassium 3.8; Sodium 137   Wt Readings from Last 3 Encounters:   02/06/19 230 lb (104.3 kg)  01/23/19 243 lb (110.2 kg)  12/25/18 243 lb 6.4 oz (110.4 kg)     ASSESSMENT & PLAN:    1.  Persistent atrial fibrillation Doing well post ablation off AAD therapy No afib by symptoms We discussed Apple Watch and KardiaMobile as options for obtaining remote ekgs during COVID as he would prefer to avoid unnecessary visits OK to stop xarelto on return to AF clinic  2. HTN Stable No change required today  3. Tachy mediated CM Normal He did have recent COVID but no symptoms to suggest myopathy.  If symptoms occur, I would have a low threshold to repeawt echo  4. Overweight Lifestyle modification is advised   Follow-up:  AF clinic in 3 months I will see in 6 months   Patient Risk:  after full review of this patients clinical status, I feel that they are at moderate risk at this time.  Today, I have spent 15 minutes with the patient with telehealth technology discussing arrhythmia management .    Army Fossa, MD  02/06/2019 9:53 AM     [facility] [address] [place] [address] [phone] (office) [phone] (fax)

## 2019-02-08 ENCOUNTER — Encounter: Payer: Self-pay | Admitting: Psychiatry

## 2019-02-08 ENCOUNTER — Ambulatory Visit (INDEPENDENT_AMBULATORY_CARE_PROVIDER_SITE_OTHER): Payer: 59 | Admitting: Psychiatry

## 2019-02-08 ENCOUNTER — Other Ambulatory Visit: Payer: Self-pay

## 2019-02-08 DIAGNOSIS — F4321 Adjustment disorder with depressed mood: Secondary | ICD-10-CM

## 2019-02-08 NOTE — Progress Notes (Signed)
      [facility] Counselor/Therapist Progress Note  Patient ID: Daniel Romero, MRN: [id number],    Date: 02/08/2019  Time Spent: 50 minutes   Treatment Type: Individual Therapy  Reported Symptoms: sad, lack of motivation  Mental Status Exam:  Appearance:   Casual     Behavior:  Appropriate  Motor:  Normal  Speech/Language:   Clear and Coherent  Affect:  Appropriate  Mood:  sad  Thought process:  normal  Thought content:    WNL  Sensory/Perceptual disturbances:    WNL  Orientation:  oriented to person, place, time/date and situation  Attention:  Good  Concentration:  Good  Memory:  WNL  Fund of knowledge:   Good  Insight:    Good  Judgment:   Good  Impulse Control:  Good   Risk Assessment: Danger to Self:  No Self-injurious Behavior: No Danger to Others: No Duty to Warn:no Physical Aggression / Violence:No  Access to Firearms a concern: No  Gang Involvement:No   Subjective: The client states that he got sick with COVID-19 on October 31.  He just recently returned back to work this past week.  He states he was pretty sick but feels like he has recovered well. This event has caused the client to rethink his health and where he is in life.  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  He sees that there are younger employees that would like to move into his position.  He knows that he is overweight and needs to be in better shape. The client is also closing the estate for his father and mother.  He has no plans currently to buy his parents house.  His sister is at the house today throwing things out that they have no use for.  The client lives in his grandmother's house which is also full of a lot of clutter and junk.  "I feel like I am camping there." I asked the client what he wanted to do?  He states that he needs to start looking for a different job and one that could actually use his degree.  He also wants to do something different with his  diet.  He also wants to clean up his house.  I asked the client as his homework to write down 5 personal goals and bring them to the next session.  I also asked him to look into the company, "the junk guy" who would come and remove all the trash from his grandmother's house.  We also discussed the concept of mood independent behavior.  The client was intrigued with that thought.  I explained that as adults we have to do it all the time.  This can be especially helpful as he tries to accomplish his goals.  Interventions: Motivational Interviewing, Solution-Oriented/Positive Psychology and Insight-Oriented  Diagnosis:   ICD-10-CM   1. Adjustment disorder with depressed mood  F43.21     Plan: Mood independent behavior, write out 5 goals, self-care, rethink diet, exercise.  Diana Armijo, [facility]

## 2019-03-05 ENCOUNTER — Other Ambulatory Visit: Payer: Self-pay

## 2019-03-05 ENCOUNTER — Encounter: Payer: Self-pay | Admitting: Psychiatry

## 2019-03-05 ENCOUNTER — Ambulatory Visit (INDEPENDENT_AMBULATORY_CARE_PROVIDER_SITE_OTHER): Payer: 59 | Admitting: Psychiatry

## 2019-03-05 DIAGNOSIS — F4321 Adjustment disorder with depressed mood: Secondary | ICD-10-CM

## 2019-03-05 NOTE — Progress Notes (Signed)
      [facility] Counselor/Therapist Progress Note  Patient ID: Daniel Romero, MRN: [id number],    Date: 03/05/2019  Time Spent: 50 minutes   Treatment Type: Individual Therapy  Reported Symptoms: lack of motivation, avoidance, sad  Mental Status Exam:  Appearance:   Casual     Behavior:  Appropriate  Motor:  Normal  Speech/Language:   Clear and Coherent  Affect:  Appropriate  Mood:  sad  Thought process:  normal  Thought content:    WNL  Sensory/Perceptual disturbances:    WNL  Orientation:  oriented to person, place, time/date and situation  Attention:  Good  Concentration:  Good  Memory:  WNL  Fund of knowledge:   Good  Insight:    Good  Judgment:   Good  Impulse Control:  Good   Risk Assessment: Danger to Self:  No Self-injurious Behavior: No Danger to Others: No Duty to Warn:no Physical Aggression / Violence:No  Access to Firearms a concern: No  Gang Involvement:No   Subjective: The client states that he did write down for goals for himself.  They are; 1) "develop a closer relationship with God.  2) close my dad's estate.  3) improve my overall health.  4) get the house I am living in squared away to have more than 2 places to sit." I asked the client which one of these was the most important to start on.  He stated his relationship with God.  He has a Estate agent that he has had contact with.  This is the church that his parents went to and that he grew up in.  He thought he would start there forming some connections. With his health he knows he needs to quit eating out so he can control his finances more effectively as well as his diet.[name]  He is worried about retirement and at 30 does not have very much saved.  He is working with a Tour manager at [employer] who has him on a plan to save money.  He needs to make his home more livable so that he is cooking at home more regularly and not eating out.  This will also be a way he says of controlling his  weight. The client Daniel Romero contact someone who does estate sales to go through his grandmother's house and his parents house to see what they can sell.  He thought this was a great idea and will follow up with that. He agrees to continue to be accountable for accomplishing these things at next session.  Interventions: Motivational Interviewing, Solution-Oriented/Positive Psychology and Insight-Oriented  Diagnosis:   ICD-10-CM   1. Adjustment disorder with depressed mood  F43.21     Plan: Contact and estate auctioneer, continue to work on closing dad's estate, eat out less, meet with pastor, self-care.  Rubina Basinski, [facility]

## 2019-04-04 ENCOUNTER — Encounter: Payer: Self-pay | Admitting: Psychiatry

## 2019-04-04 ENCOUNTER — Ambulatory Visit (INDEPENDENT_AMBULATORY_CARE_PROVIDER_SITE_OTHER): Payer: 59 | Admitting: Psychiatry

## 2019-04-04 ENCOUNTER — Other Ambulatory Visit: Payer: Self-pay

## 2019-04-04 DIAGNOSIS — F4323 Adjustment disorder with mixed anxiety and depressed mood: Secondary | ICD-10-CM

## 2019-04-04 NOTE — Progress Notes (Signed)
      [facility] Counselor/Therapist Progress Note  Patient ID: Daniel Romero, MRN: [id number],    Date: 04/04/2019  Time Spent: 50 minutes   Treatment Type: Individual Therapy  Reported Symptoms: depressed, anxious  Mental Status Exam:  Appearance:   Casual     Behavior:  Appropriate  Motor:  Normal  Speech/Language:   Clear and Coherent  Affect:  Appropriate  Mood:  anxious and depressed  Thought process:  normal  Thought content:    WNL  Sensory/Perceptual disturbances:    WNL  Orientation:  oriented to person, place, time/date and situation  Attention:  Good  Concentration:  Good  Memory:  WNL  Fund of knowledge:   Good  Insight:    Good  Judgment:   Good  Impulse Control:  Good   Risk Assessment: Danger to Self:  No Self-injurious Behavior: No Danger to Others: No Duty to Warn:no Physical Aggression / Violence:No  Access to Firearms a concern: No  Gang Involvement:No   Subjective: The client states that his holidays went okay.  He spent time with his current girlfriend but also saw his old girlfriend.  This is caused him internal conflict because he knows he is lying to his current girlfriend.  "I am feeling pretty bad."  The client feels like he has gotten more depressed and anxious.  Main issue has to do with his job.  He is in a lead position in a Patent examiner.  There is a younger man that he has been cross training for his job.  He feels that he is being squeezed out.  This other employee is affable, charismatic and well-liked among the other employees.  He feels like he will be replaced by him.  We focused on this today using eye-movement.  His negative cognition is, "I am not good enough."  He feels dread in his stomach.  His subjective units of distress is an 8.  As the client processed he stated, "I never really wanted to work in a [place] maybe this is my way out."  We discussed the fact that if he did get laid off what could be his options?  He is  not far from retirement.  Once his parents estate is settled he will have a little bit more money.  He states he like to work somewhere quiet and peaceful.  He would like to be in [employer] or somewhere like [place].  As the client continue to discuss this his subjective units of distress dropped to below 3.  His positive cognition was, "I do have options."  I encouraged the client to do some job searches just to see what might be available.  We will also discuss more about what might be  a more appropriate job for him.  Interventions: Assertiveness/Communication, Solution-Oriented/Positive Psychology, Psycho-education/Bibliotherapy, Eye Movement Desensitization and Reprocessing (EMDR) and Insight-Oriented  Diagnosis:   ICD-10-CM   1. Adjustment disorder with mixed anxiety and depressed mood  F43.23     Plan: Positive self talk, job Secretary/administrator, meet with financial advisor, boundaries, assertiveness, self-care.  Dameka Younker, [facility]

## 2019-04-29 ENCOUNTER — Telehealth (HOSPITAL_COMMUNITY): Payer: Self-pay | Admitting: Nurse Practitioner

## 2019-04-29 NOTE — Telephone Encounter (Signed)
Per staff message from Dr. Rayann Heman, called and left message for patient to call [facility]Fib Clinic.  Pt needs to schedule 3 mo f/u with Roderic Palau, NP this month.

## 2019-05-07 ENCOUNTER — Encounter: Payer: Self-pay | Admitting: Psychiatry

## 2019-05-07 ENCOUNTER — Ambulatory Visit (INDEPENDENT_AMBULATORY_CARE_PROVIDER_SITE_OTHER): Payer: 59 | Admitting: Psychiatry

## 2019-05-07 ENCOUNTER — Other Ambulatory Visit: Payer: Self-pay

## 2019-05-07 DIAGNOSIS — F4323 Adjustment disorder with mixed anxiety and depressed mood: Secondary | ICD-10-CM

## 2019-05-07 NOTE — Progress Notes (Signed)
      [facility] Counselor/Therapist Progress Note  Patient ID: Derrall Braccia, MRN: [id number],    Date: 05/07/2019  Time Spent: 50 minutes   Treatment Type: Individual Therapy  Reported Symptoms: sad, anxious  Mental Status Exam:  Appearance:   Casual     Behavior:  Appropriate  Motor:  Normal  Speech/Language:   Clear and Coherent  Affect:  Appropriate  Mood:  anxious and sad  Thought process:  normal  Thought content:    WNL  Sensory/Perceptual disturbances:    WNL  Orientation:  oriented to person, place, time/date and situation  Attention:  Good  Concentration:  Good  Memory:  WNL  Fund of knowledge:   Good  Insight:    Good  Judgment:   Good  Impulse Control:  Good   Risk Assessment: Danger to Self:  No Self-injurious Behavior: No Danger to Others: No Duty to Warn:no Physical Aggression / Violence:No  Access to Firearms a concern: No  Gang Involvement:No   Subjective: The client still feels he is in danger of losing his job.  His younger coworker who is his "under study", seems to do a good job and is impressing his Freight forwarder.  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  He already sees some of the older workers leaving due to pressure that they feel.  The client still hopes to close on his father's estate this year and see where that leaves him financially.  Today we discussed radical acceptance of where he is.  We also discussed actively searching for jobs.  The client has difficulty with motivation.  He also feels a certain amount of hopelessness.  He is trying to do one thing at a time.  I discussed with the client of trying to find at least one job he can think about applying for.  Also to continue to focus on his self-care and positive self talk.  The client will continue to do so.  Interventions: Motivational Interviewing, Solution-Oriented/Positive Psychology and Insight-Oriented  Diagnosis:   ICD-10-CM   1. Adjustment  disorder with mixed anxiety and depressed mood  F43.23     Plan: Mood independent behavior, positive self talk, self-care, complete inventory with the estate, find one job listing, radical acceptance.  Prakash Kimberling, [facility]

## 2019-05-10 NOTE — Telephone Encounter (Signed)
Called and left 2nd VM for pt to cb.  Need to schedule f/u appt for pt with Roderic Palau, NP.

## 2019-05-20 NOTE — Telephone Encounter (Signed)
Patient has not called back.  Recall letter was mailed to patient today asking him to call our office to schedule an appt.

## 2019-05-21 ENCOUNTER — Other Ambulatory Visit: Payer: Self-pay | Admitting: Family Medicine

## 2019-06-04 ENCOUNTER — Ambulatory Visit (HOSPITAL_COMMUNITY)
Admission: RE | Admit: 2019-06-04 | Discharge: 2019-06-04 | Disposition: A | Discharge status: Home / Self Care | Payer: 59 | Source: Ambulatory Visit | Attending: Nurse Practitioner | Admitting: Nurse Practitioner

## 2019-06-04 ENCOUNTER — Encounter (HOSPITAL_COMMUNITY): Payer: Self-pay | Admitting: Nurse Practitioner

## 2019-06-04 ENCOUNTER — Other Ambulatory Visit: Payer: Self-pay

## 2019-06-04 ENCOUNTER — Ambulatory Visit: Payer: 59 | Admitting: Psychiatry

## 2019-06-04 VITALS — BP 118/74 | HR 57 | Ht 70.0 in | Wt 220.0 lb

## 2019-06-04 DIAGNOSIS — Z87891 Personal history of nicotine dependence: Secondary | ICD-10-CM | POA: Diagnosis not present

## 2019-06-04 DIAGNOSIS — Z8249 Family history of ischemic heart disease and other diseases of the circulatory system: Secondary | ICD-10-CM | POA: Diagnosis not present

## 2019-06-04 DIAGNOSIS — Z79899 Other long term (current) drug therapy: Secondary | ICD-10-CM | POA: Insufficient documentation

## 2019-06-04 DIAGNOSIS — Z85828 Personal history of other malignant neoplasm of skin: Secondary | ICD-10-CM | POA: Diagnosis not present

## 2019-06-04 DIAGNOSIS — I4819 Other persistent atrial fibrillation: Secondary | ICD-10-CM | POA: Diagnosis present

## 2019-06-04 DIAGNOSIS — Z7901 Long term (current) use of anticoagulants: Secondary | ICD-10-CM | POA: Insufficient documentation

## 2019-06-04 DIAGNOSIS — I110 Hypertensive heart disease with heart failure: Secondary | ICD-10-CM | POA: Diagnosis not present

## 2019-06-04 DIAGNOSIS — I5022 Chronic systolic (congestive) heart failure: Secondary | ICD-10-CM | POA: Insufficient documentation

## 2019-06-04 DIAGNOSIS — D6869 Other thrombophilia: Secondary | ICD-10-CM

## 2019-06-04 NOTE — Progress Notes (Signed)
Primary Care Physician: Denita Lung, MD Referring Physician: Dr. Val Riles Daniel Romero is a 59 y.o. male with a h/o HTN, asymptomatic, newly diagnosed afib in August of this year being referred to Dr. Gwenlyn Found at that time. He was cardioverted,12/01/17, after sufficient time of being anticoagulanted. This was successful but he was back in afib on f/u with Dr. Gwenlyn Found.Echo showed an EF of 40-45%. It was discussed with pt even thought he is asymptomatic his reduced EF may be 2/2 to [facility] and it would be beneficial to try to restore SR going forward. He currently does not drink or smoke. Mod caffeine use. Denies a snoring history.   F/u in afib clinic, 12/10. He is here for Tikosyn admit. He stopped lisinopril/hctz last Thursday. He is aware of cost of drug and can afford.   F/u 12/30. Unfortunately, pt failed Tikosyn to restore SR  and this was stopped and he was loaded on Amiodarone. He is now on amiodarone  X 2 weeks, continues in rate controlled afib, and hopefully this will be a bridge to ablation down the road.   F/u afib clinic 04/12/18. S/p DCCV 04/06/18 which was initially successful but he reports early return to afib the next morning. He did notice a mild improvement in his overall wellbeing when he was in normal rhythm. He has continued on amiodarone 200 mg BID and Xarelto 20 mg daily with no missed doses.  F/u in afib clinic 07/05/18. He asked to be seen as he was seeing his counselor yesterday and he mentioned he had had some shortness of breath. He ia about 6 weeks ost ablation. His counselor said that he needed to be seen right away as a PA that had worked with him, c/o of shortness of breath and died a few days later. He only notices it going up steps carrying boxes as he has been trying to organize some things.. He doe not notice any PND or orthopnea. No pedal edema. He has had around 10 lb wight gain but his father died around one month ago and he admits to stress/comfort eating in the  last few weeks.   F/u in afib clinic, 06/04/19. He reports staying in SR. EKG shows Sinus brady at 57 bpm. He feels well  Today, he denies symptoms of palpitations, chest pain, orthopnea, PND, lower extremity edema, dizziness, presyncope, syncope, or neurologic sequela. The patient is tolerating medications without difficulties and is otherwise without complaint today.   Past Medical History:  Diagnosis Date  . Anginal pain ([place])   . Chronic systolic dysfunction of left ventricle   . Depression   . Hypertension   . Obesity   . Persistent atrial fibrillation ([place])   . Seasonal allergies   . Skin cancer 04/2018   BACK   Past Surgical History:  Procedure Laterality Date  . ATRIAL FIBRILLATION ABLATION  05/08/2018  . ATRIAL FIBRILLATION ABLATION N/A 05/08/2018   Procedure: ATRIAL FIBRILLATION ABLATION;  Surgeon: Thompson Grayer, MD;  Location: [place] CV LAB;  Service: Cardiovascular;  Laterality: N/A;  . CARDIOVERSION N/A 12/01/2017   Procedure: CARDIOVERSION;  Surgeon: Larey Dresser, MD;  Location: [facility] ENDOSCOPY;  Service: Cardiovascular;  Laterality: N/A;  . CARDIOVERSION N/A 03/01/2018   Procedure: CARDIOVERSION;  Surgeon: Elouise Munroe, MD;  Location: [facility] ENDOSCOPY;  Service: Cardiovascular;  Laterality: N/A;  . CARDIOVERSION N/A 04/06/2018   Procedure: CARDIOVERSION;  Surgeon: Thayer Headings, MD;  Location: [place];  Service: Cardiovascular;  Laterality: N/A;  .  COLONOSCOPY  ~ 2015  . SKIN BIOPSY Left 03/30/2018   squamous cell carcinoma in situ  . WISDOM TOOTH EXTRACTION      Current Outpatient Medications  Medication Sig Dispense Refill  . carvedilol (COREG) 6.25 MG tablet TAKE 1 TABLET BY MOUTH TWICE DAILY WITH A MEAL 60 tablet 11  . lisinopril-hydrochlorothiazide (ZESTORETIC) 10-12.5 MG tablet TAKE 1 TABLET BY MOUTH DAILY 90 tablet 1  . XARELTO 20 MG TABS tablet TAKE 1 TABLET(20 MG) BY MOUTH DAILY WITH SUPPER 90 tablet 3   No current facility-administered  medications for this encounter.    No Known Allergies  Social History   Socioeconomic History  . Marital status: Single    Spouse name: Not on file  . Number of children: Not on file  . Years of education: Not on file  . Highest education level: Not on file  Occupational History  . Not on file  Tobacco Use  . Smoking status: Former Smoker    Packs/day: 0.75    Years: 5.00    Pack years: 3.75    Types: Cigarettes    Quit date: 05/20/2013    Years since quitting: 6.0  . Smokeless tobacco: Never Used  Substance and Sexual Activity  . Alcohol use: Not Currently  . Drug use: Not Currently  . Sexual activity: Yes  Other Topics Concern  . Not on file  Social History Narrative  . Not on file   Social Determinants of Health   Financial Resource Strain:   . Difficulty of Paying Living Expenses:   Food Insecurity:   . Worried About Charity fundraiser in the Last Year:   . Arboriculturist in the Last Year:   Transportation Needs:   . Film/video editor (Medical):   Daniel Kitchen Lack of Transportation (Non-Medical):   Physical Activity:   . Days of Exercise per Week:   . Minutes of Exercise per Session:   Stress:   . Feeling of Stress :   Social Connections:   . Frequency of Communication with Friends and Family:   . Frequency of Social Gatherings with Friends and Family:   . Attends Religious Services:   . Active Member of Clubs or Organizations:   . Attends Archivist Meetings:   Daniel Kitchen Marital Status:   Intimate Partner Violence:   . Fear of Current or Ex-Partner:   . Emotionally Abused:   Daniel Kitchen Physically Abused:   . Sexually Abused:     Family History  Problem Relation Age of Onset  . Heart disease Mother        chf  . Stroke Father   . Heart disease Father   . Diabetes Father   . Colon cancer Neg Hx   . Esophageal cancer Neg Hx   . Stomach cancer Neg Hx   . Rectal cancer Neg Hx     ROS- All systems are reviewed and negative except as per the HPI  above  Physical Exam: There were no vitals filed for this visit. Wt Readings from Last 3 Encounters:  02/06/19 104.3 kg  01/23/19 110.2 kg  12/25/18 110.4 kg    Labs: Lab Results  Component Value Date   NA 137 05/14/2018   K 3.8 05/14/2018   CL 103 05/14/2018   CO2 24 05/14/2018   GLUCOSE 176 (H) 05/14/2018   BUN 17 05/14/2018   CREATININE 1.30 (H) 05/14/2018   CALCIUM 8.9 05/14/2018   MG 2.0 03/02/2018   Lab Results  Component Value Date   INR 1.8 05/14/2018   Lab Results  Component Value Date   CHOL 193 10/13/2017   HDL 58 10/13/2017   LDLCALC 112 (H) 10/13/2017   TRIG 114 10/13/2017     GEN- The patient is well appearing, alert and oriented x 3 today.   HEENT-head normocephalic, atraumatic, sclera clear, conjunctiva pink, hearing intact, trachea midline. Lungs- Clear to ausculation bilaterally, normal work of breathing Heart- regular rate and rhythm, no murmurs, rubs or gallops  GI- soft, NT, ND, + BS Extremities- no clubbing, cyanosis, or edema MS- no significant deformity or atrophy Skin- no rash or lesion Psych- euthymic mood, full affect Neuro- strength and sensation are intact   EKG- sinus brady at 57 bpm, pr int 148 ms, qrs int 96 ms, qtc 385 ms Echo-Study Conclusions  - Left ventricle: The cavity size was normal. Wall thickness was   increased in a pattern of mild LVH. Systolic function was mildly   to moderately reduced. The estimated ejection fraction was in the   range of 40% to 45%. Diffuse hypokinesis. There was no evidence   of elevated ventricular filling pressure by Doppler parameters. - Left atrium: The atrium was mildly dilated. Volume/bsa, ES,   (1-plane Simpson&'s, A2C): 35.3 ml/m^2. Left atrium 41 mm  Assessment and Plan:  1. Persistent  Afib Failed Tikosyn as it did not restore SR. He loaded on amiodarone as a bridge to ablation which was done 05/08/18 He is now off amiodarone and appears to be staying in [place] Per Dr. Jackalyn Lombard  note,  Xarelto 20 mg daily, can be stopped at this visit, for a CHA2DS2VASc of 1 since EF has normalized since back in SR, per echo in May 2020, EF 55-60% He was encouraged to use Kardia to track rhythm   2. HTN Stable, no changes today.  3. Cardiomyopathy  Resolved    F/u with Dr. Rayann Heman in  May per recall     Daniel Romero, [facility] [address] [place], [place] [address] [phone]

## 2019-06-04 NOTE — Patient Instructions (Addendum)
Stop Zarelto Follow up with Dr. Rayann Heman per recall in May.

## 2019-06-05 ENCOUNTER — Encounter: Payer: Self-pay | Admitting: Psychiatry

## 2019-06-05 ENCOUNTER — Ambulatory Visit (INDEPENDENT_AMBULATORY_CARE_PROVIDER_SITE_OTHER): Payer: 59 | Admitting: Psychiatry

## 2019-06-05 DIAGNOSIS — F4323 Adjustment disorder with mixed anxiety and depressed mood: Secondary | ICD-10-CM

## 2019-06-05 NOTE — Progress Notes (Signed)
      [facility] Counselor/Therapist Progress Note  Patient ID: Zadarius Marn, MRN: [id number],    Date: 06/05/2019  Time Spent: 50 minutes   Treatment Type: Individual Therapy  Reported Symptoms: anxiety depressed  Mental Status Exam:  Appearance:   Well Groomed     Behavior:  Appropriate  Motor:  Normal  Speech/Language:   Clear and Coherent  Affect:  Appropriate  Mood:  anxious and sad  Thought process:  normal  Thought content:    WNL  Sensory/Perceptual disturbances:    WNL  Orientation:  oriented to person, place, time/date and situation  Attention:  Good  Concentration:  Good  Memory:  WNL  Fund of knowledge:   Good  Insight:    Good  Judgment:   Good  Impulse Control:  Good   Risk Assessment: Danger to Self:  No Self-injurious Behavior: No Danger to Others: No Duty to Warn:no Physical Aggression / Violence:No  Access to Firearms a concern: No  Gang Involvement:No   Subjective: The client is pleased that he is off his blood thinner and his heart has stayed in rhythm.  Today the client is sad and anxious.  He stated he broke up with his girlfriend in [place].  She had rage at him for an hour about his 57 year old male friend with whom she was jealous.  He left and said he needed a break.  He does not think he will reconnect with her. I asked the client why it took so much for him to decide that the relationship was not healthy for him?  We discussed the fact that he learned this passive modality from his mom.  I started with eye-movement with the client around this issue.  As he processed, he remembered events where he saw other people standing up for themselves and he thought that was so strange.  As result he has drifted through life not making hard decisions to direct himself one way.  I asked the client now that he is not traveling to [place] every weekend what does he need to do to take care of himself?  I pointed out to the client that he has always  done the minimum for himself.  What if that changed?  The client stated that a friend of his makes an [employer].  He has bought the ingredients to make it.  It has sat on his counter for a long time.  I encouraged the client to make the effort to make the cake.  Then decide what he could do next.  The client Shanon Seawright contact the junk removal service to clear out the house he lives in.  I also encouraged him to go on Pinterest to find ideas about lifestyle that he Mima Cranmore find interesting.  The client agreed.  He plans to be proactive.  Interventions: Assertiveness/Communication, Motivational Interviewing, Solution-Oriented/Positive Psychology, [employer] Desensitization and Reprocessing (EMDR) and Insight-Oriented  Diagnosis:   ICD-10-CM   1. Adjustment disorder with mixed anxiety and depressed mood  F43.23     Plan: Mood independent behavior, self-care, positive self talk, boundaries, assertiveness, plans for his future.  Morrissa Shein, [facility]

## 2019-06-13 ENCOUNTER — Ambulatory Visit: Payer: 59

## 2019-07-04 ENCOUNTER — Ambulatory Visit (INDEPENDENT_AMBULATORY_CARE_PROVIDER_SITE_OTHER): Payer: 59 | Admitting: Psychiatry

## 2019-07-04 ENCOUNTER — Encounter: Payer: Self-pay | Admitting: Psychiatry

## 2019-07-04 ENCOUNTER — Other Ambulatory Visit: Payer: Self-pay

## 2019-07-04 DIAGNOSIS — F4323 Adjustment disorder with mixed anxiety and depressed mood: Secondary | ICD-10-CM | POA: Diagnosis not present

## 2019-07-04 NOTE — Progress Notes (Signed)
      [facility] Counselor/Therapist Progress Note  Patient ID: Daniel Romero, MRN: [id number],    Date: 07/04/2019  Time Spent: 50 minutes   Treatment Type: Individual Therapy  Reported Symptoms: anxious, sad  Mental Status Exam:  Appearance:   Casual     Behavior:  Appropriate  Motor:  Normal  Speech/Language:   Clear and Coherent  Affect:  Appropriate  Mood:  anxious and sad  Thought process:  normal  Thought content:    WNL  Sensory/Perceptual disturbances:    WNL  Orientation:  oriented to person, place, time/date and situation  Attention:  Good  Concentration:  Good  Memory:  WNL  Fund of knowledge:   Good  Insight:    Good  Judgment:   Good  Impulse Control:  Good   Risk Assessment: Danger to Self:  No Self-injurious Behavior: No Danger to Others: No Duty to Warn:no Physical Aggression / Violence:No  Access to Firearms a concern: No  Gang Involvement:No   Subjective: The client's states that he did do one homework exercise.  He completed making the Almond bread that is a recipe from friend.  He stated it did not rise well but he will try again.  He has not done anything about removing all the junk from his house.  The client states that he has made some progress on his parents estate.  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  He hopes to be able to sell the whole property as one.  He is waiting for an appraisal to come in.  He recently sold both of his parents cars which turned out to be a much longer process than he anticipated. Much of the activities that the client is doing are things he has never done before in his life.  This causes him quite a bit of anxiety.  He states, "sometimes when I come home at night I feel like nothing will ever be right."  This causes the client to feel overwhelmed and then lose motivation to move forward.  His 2 coping mechanisms are to go visit friends or sleep.  I discussed with the client that it might be  helpful for him to make a list of what has to be done.  Rather than trying to do everything he could take it 1 step at a time.  If he just worked with 1 eventually he would get through the rest.  The client agreed and stated he would try this.  Interventions: Motivational Interviewing, Solution-Oriented/Positive Psychology and Insight-Oriented  Diagnosis:   ICD-10-CM   1. Adjustment disorder with mixed anxiety and depressed mood  F43.23     Plan: Mood independent behavior, make a list, take 1 item at a time, assertiveness, boundaries, self-care.  Stephani Janak, [facility]

## 2019-07-17 ENCOUNTER — Encounter: Payer: Self-pay | Admitting: Cardiovascular Disease

## 2019-07-17 ENCOUNTER — Ambulatory Visit (INDEPENDENT_AMBULATORY_CARE_PROVIDER_SITE_OTHER): Payer: 59 | Admitting: Cardiovascular Disease

## 2019-07-17 ENCOUNTER — Other Ambulatory Visit: Payer: Self-pay

## 2019-07-17 DIAGNOSIS — I10 Essential (primary) hypertension: Secondary | ICD-10-CM | POA: Diagnosis not present

## 2019-07-17 DIAGNOSIS — I4819 Other persistent atrial fibrillation: Secondary | ICD-10-CM

## 2019-07-17 DIAGNOSIS — I428 Other cardiomyopathies: Secondary | ICD-10-CM | POA: Diagnosis not present

## 2019-07-17 NOTE — Assessment & Plan Note (Signed)
History of essential hypertension with blood pressure measured today 117/67.  He is on carvedilol, lisinopril and hydrochlorothiazide.

## 2019-07-17 NOTE — Assessment & Plan Note (Signed)
History of persistent A. fib status post failed Tikosyn and amiodarone but ultimately conversion to sinus rhythm status post AF ablation by Dr. Rayann Heman 05/08/2018 maintaining sinus rhythm.  His oral anticoagulation was discontinued.  He feels clinically improved.

## 2019-07-17 NOTE — Progress Notes (Signed)
07/17/2019 Daniel Romero   08-Aug-1960  [id number]  Primary Physician Denita Lung, MD Primary Cardiologist: Lorretta Harp MD Lupe Carney, [place]  HPI:  Daniel Romero is a 59 y.o.   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 Redmond School for cardiovascular evaluation because of newly recognized A. fib.I last saw him in the office 12/25/2018. He does have a history of treated hypertension. He smoked remotely and drank remotely as well stopped 7 years ago. He is never had a heart attack or stroke. There is no family history. He is not aware that he is in A. fib. He was begun on Xarelto.  I arranged for him to undergo outpatient cardioversion performed by Dr.McLeansuccessfully 12/01/2017. He really cannot tell a difference although I suspect he is back in A. fib today. He did have a 2D echocardiogram performed 11/07/2017 revealing an EF of 40 to 45% with mild left atrial dilatation.  This most recent 2D echo performed 07/25/2018 revealed normal LV systolic function.  Ultimately referred him to Dr. Rayann Heman for evaluation of his A. fib.  He failed amiodarone and Tikosyn and ultimately had atrial fibrillation ablation by Dr. Rayann Heman 05/08/2018.  He is maintaining sinus rhythm and no longer is on oral anticoagulation.  He continues to follow-up with Roderic Palau, NP in our A. fib clinic who recently saw him on 06/04/2019.  He is maintaining sinus rhythm and feels clinically improved.  Follow-up 2D echocardiogram performed 07/30/2018 revealed improvement in his ejection fraction up to 55 to 60% compared to his prior echo in 2019 which revealed an EF of 40 to 45% probably related to his A. fib.   Current Meds  Medication Sig  . carvedilol (COREG) 6.25 MG tablet TAKE 1 TABLET BY MOUTH TWICE DAILY WITH A MEAL  . lisinopril-hydrochlorothiazide (ZESTORETIC) 10-12.5 MG tablet TAKE 1 TABLET BY MOUTH  DAILY     No Known Allergies  Social History   Socioeconomic History  . Marital status: Single    Spouse name: Not on file  . Number of children: Not on file  . Years of education: Not on file  . Highest education level: Not on file  Occupational History  . Not on file  Tobacco Use  . Smoking status: Former Smoker    Packs/day: 0.75    Years: 5.00    Pack years: 3.75    Types: Cigarettes    Quit date: 05/20/2013    Years since quitting: 6.1  . Smokeless tobacco: Never Used  Substance and Sexual Activity  . Alcohol use: Not Currently  . Drug use: Not Currently  . Sexual activity: Yes  Other Topics Concern  . Not on file  Social History Narrative  . Not on file   Social Determinants of Health   Financial Resource Strain:   . Difficulty of Paying Living Expenses:   Food Insecurity:   . Worried About Charity fundraiser in the Last Year:   . Arboriculturist in the Last Year:   Transportation Needs:   . Film/video editor (Medical):   Daniel Romero Lack of Transportation (Non-Medical):   Physical Activity:   . Days of Exercise per Week:   . Minutes of Exercise per Session:   Stress:   . Feeling of Stress :   Social Connections:   . Frequency of Communication with Friends and Family:   . Frequency of Social Gatherings  with Friends and Family:   . Attends Religious Services:   . Active Member of Clubs or Organizations:   . Attends Archivist Meetings:   Daniel Romero Marital Status:   Intimate Partner Violence:   . Fear of Current or Ex-Partner:   . Emotionally Abused:   Daniel Romero Physically Abused:   . Sexually Abused:      Review of Systems: General: negative for chills, fever, night sweats or weight changes.  Cardiovascular: negative for chest pain, dyspnea on exertion, edema, orthopnea, palpitations, paroxysmal nocturnal dyspnea or shortness of breath Dermatological: negative for rash Respiratory: negative for cough or wheezing Urologic: negative for hematuria Abdominal:  negative for nausea, vomiting, diarrhea, bright red blood per rectum, melena, or hematemesis Neurologic: negative for visual changes, syncope, or dizziness All other systems reviewed and are otherwise negative except as noted above.    Blood pressure 117/67, pulse (!) 57, height 5\' 10"  (1.778 m), weight 224 lb (101.6 kg), SpO2 98 %.  General appearance: alert and no distress Neck: no adenopathy, no carotid bruit, no JVD, supple, symmetrical, trachea midline and thyroid not enlarged, symmetric, no tenderness/mass/nodules Lungs: clear to auscultation bilaterally Heart: regular rate and rhythm, S1, S2 normal, no murmur, click, rub or gallop Extremities: extremities normal, atraumatic, no cyanosis or edema Pulses: 2+ and symmetric Skin: Skin color, texture, turgor normal. No rashes or lesions Neurologic: Alert and oriented X 3, normal strength and tone. Normal symmetric reflexes. Normal coordination and gait  EKG not performed today  ASSESSMENT AND PLAN:   Essential hypertension History of essential hypertension with blood pressure measured today 117/67.  He is on carvedilol, lisinopril and hydrochlorothiazide.  Persistent atrial fibrillation History of persistent A. fib status post failed Tikosyn and amiodarone but ultimately conversion to sinus rhythm status post AF ablation by Dr. Rayann Heman 05/08/2018 maintaining sinus rhythm.  His oral anticoagulation was discontinued.  He feels clinically improved.  Cardiomyopathy [facility]) History of nonischemic cardiomyopathy with an EF of 40 to 45% by 2D echocardiogram August 2019.  Subsequent 2D echocardiogram performed several months after AF ablation revealed a rise in his EF up to 55 to 60%.      Lorretta Harp MD FACP,FACC,FAHA, [facility] 07/17/2019 4:57 PM

## 2019-07-17 NOTE — Assessment & Plan Note (Signed)
History of nonischemic cardiomyopathy with an EF of 40 to 45% by 2D echocardiogram August 2019.  Subsequent 2D echocardiogram performed several months after AF ablation revealed a rise in his EF up to 55 to 60%.

## 2019-07-17 NOTE — Patient Instructions (Signed)

## 2019-08-06 ENCOUNTER — Ambulatory Visit (INDEPENDENT_AMBULATORY_CARE_PROVIDER_SITE_OTHER): Payer: 59 | Admitting: Psychiatry

## 2019-08-06 ENCOUNTER — Other Ambulatory Visit: Payer: Self-pay

## 2019-08-06 ENCOUNTER — Encounter: Payer: Self-pay | Admitting: Psychiatry

## 2019-08-06 DIAGNOSIS — F4323 Adjustment disorder with mixed anxiety and depressed mood: Secondary | ICD-10-CM | POA: Diagnosis not present

## 2019-08-06 NOTE — Progress Notes (Signed)
[facility] Counselor/Therapist Progress Note  Patient ID: Andria Diperna, MRN: [id number],    Date: 08/06/2019  Time Spent: 50 minutes   Treatment Type: Individual Therapy  Reported Symptoms: sad, anxious, irritated  Mental Status Exam:  Appearance:   Casual     Behavior:  Appropriate  Motor:  Normal  Speech/Language:   Clear and Coherent  Affect:  Appropriate  Mood:  anxious, irritable and sad  Thought process:  normal  Thought content:    WNL  Sensory/Perceptual disturbances:    WNL  Orientation:  oriented to person, place, time/date and situation  Attention:  Good  Concentration:  Good  Memory:  WNL  Fund of knowledge:   Good  Insight:    Good  Judgment:   Good  Impulse Control:  Good   Risk Assessment: Danger to Self:  No Self-injurious Behavior: No Danger to Others: No Duty to Warn:no Physical Aggression / Violence:No  Access to Firearms a concern: No  Gang Involvement:No   Subjective: The client states that 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  This has caused him some anxiety.  He believes that the people that come to the sale will not like him.  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  I discussed with the client what was he feeling today as he talked about this?  He noted he had more anxiety.  I used the bilateral stimulation hand paddles with the client as he continued to process this upcoming event.  He has decided that he will be at his girlfriend's house in [place].  He also stated he has conflicts about his relationship with his girlfriend.  His 57 year old male friend that he has been involved with since 1994 makes his current girlfriend jealous.  They have been together now for 5 years and she wonders where this relationship is going?  The client has difficulty making decisions in general.  Whether it is for his father's estate or in his  relationship with his girlfriend.  He tends to let events take on a life of their own.  When the outcomes occur he just has to accept what has happened.  I pointed out to the client that this has not worked well for him.  He agrees.  He stays in the relationship because he wants someone to talk to.  He does not want to be alone.  I pointed out to the client that he is not willing to put the effort into the relationship to make it successful for himself.  He agrees.  His only long-term relationship is with this 57 year old woman with whom he had a sexual relationship in the early 64s when he was in his 58s and she was in her 68s.  "When she dies it will be the end of my relationship in [place]."  As the client continue to process he knows he needs to make some decisions.  He is trying to practice more mood independent behavior.  He does have some radical acceptance with his current circumstances.  He knows he needs to be more proactive.  At the end of the session his subjective units of distress went from a 5 to less than 2.  "I feel much better."  Interventions: Assertiveness/Communication, Motivational Interviewing, Solution-Oriented/Positive Psychology, Eye Movement Desensitization and Reprocessing (EMDR) and Insight-Oriented  Diagnosis:   ICD-10-CM   1.  Adjustment disorder with mixed anxiety and depressed mood  F43.23     Plan: Radical acceptance, mood independent behavior, proactive stance, assertiveness, boundaries, positive self talk.  Naomi Fitton, [facility]

## 2019-08-27 ENCOUNTER — Ambulatory Visit: Payer: 59 | Admitting: Psychiatry

## 2019-10-07 ENCOUNTER — Other Ambulatory Visit: Payer: Self-pay

## 2019-10-07 ENCOUNTER — Ambulatory Visit (INDEPENDENT_AMBULATORY_CARE_PROVIDER_SITE_OTHER): Payer: 59 | Admitting: Psychiatry

## 2019-10-07 ENCOUNTER — Encounter: Payer: Self-pay | Admitting: Psychiatry

## 2019-10-07 DIAGNOSIS — F4323 Adjustment disorder with mixed anxiety and depressed mood: Secondary | ICD-10-CM | POA: Diagnosis not present

## 2019-10-07 NOTE — Progress Notes (Signed)
      [facility] Counselor/Therapist Progress Note  Patient ID: Daniel Romero, MRN: [id number],    Date: 10/07/2019  Time Spent: 50 minutes   Treatment Type: Individual Therapy  Reported Symptoms: anxious, sad  Mental Status Exam:  Appearance:   Casual     Behavior:  Appropriate  Motor:  Normal  Speech/Language:   Clear and Coherent  Affect:  Appropriate  Mood:  anxious and sad  Thought process:  normal  Thought content:    WNL  Sensory/Perceptual disturbances:    WNL  Orientation:  oriented to person, place, time/date and situation  Attention:  Good  Concentration:  Good  Memory:  WNL  Fund of knowledge:   Good  Insight:    Good  Judgment:   Good  Impulse Control:  Good   Risk Assessment: Danger to Self:  No Self-injurious Behavior: No Danger to Others: No Duty to Warn:no Physical Aggression / Violence:No  Access to Firearms a concern: No  Gang Involvement:No   Subjective: The client states that he has not done much of what we talked about from last session.  He is still in the same relationship in [place].  He states that when he settles his father's estate girlfriend expects him to move in with her in [place].  As I discussed this with the client he became very anxious.  "I always settled."  I pointed out the client had settled in this relationship and he had settled in his job as well.  Today I started with eye-movement around both of these issues for the client.  As he processed he realized "there is no alternative."  He started to become panicky.  "Everything feels wrong."  I discussed with the client that he had taken a passive stance in his relationship and jobs.  He states that he wanted something different that he would not have to think a lot about.  He came up with a list of 3 places that he might make application to.  They are hospital, a Art therapist or possibly [employer].  The client agrees that part of his homework will be to apply for  jobs.  As he continued to process his subjective units of distress went from a 7 to less than 3.  His positive cognition was, "I need to be proactive."  Interventions: Motivational Interviewing, Solution-Oriented/Positive Psychology, [employer] Desensitization and Reprocessing (EMDR) and Insight-Oriented  Diagnosis:   ICD-10-CM   1. Adjustment disorder with mixed anxiety and depressed mood  F43.23     Plan: Mood independent behavior, apply for jobs, self-care, positive self talk, assertiveness, boundaries.  Renetta Suman, [facility]

## 2019-10-17 ENCOUNTER — Ambulatory Visit: Payer: 59 | Admitting: Family Medicine

## 2019-10-17 ENCOUNTER — Other Ambulatory Visit: Payer: Self-pay

## 2019-10-17 ENCOUNTER — Encounter: Payer: Self-pay | Admitting: Family Medicine

## 2019-10-17 VITALS — BP 120/80 | Temp 98.5°F | Ht 69.5 in | Wt 235.8 lb

## 2019-10-17 DIAGNOSIS — I428 Other cardiomyopathies: Secondary | ICD-10-CM | POA: Diagnosis not present

## 2019-10-17 DIAGNOSIS — Z125 Encounter for screening for malignant neoplasm of prostate: Secondary | ICD-10-CM

## 2019-10-17 DIAGNOSIS — I10 Essential (primary) hypertension: Secondary | ICD-10-CM

## 2019-10-17 DIAGNOSIS — F1011 Alcohol abuse, in remission: Secondary | ICD-10-CM | POA: Diagnosis not present

## 2019-10-17 DIAGNOSIS — Z8679 Personal history of other diseases of the circulatory system: Secondary | ICD-10-CM

## 2019-10-17 DIAGNOSIS — Z0000 Encounter for general adult medical examination without abnormal findings: Secondary | ICD-10-CM

## 2019-10-17 DIAGNOSIS — Z833 Family history of diabetes mellitus: Secondary | ICD-10-CM

## 2019-10-17 MED ORDER — LISINOPRIL-HYDROCHLOROTHIAZIDE 10-12.5 MG PO TABS: 1.0000 | ORAL_TABLET | Freq: Every day | ORAL | 1 refills | Status: DC

## 2019-10-17 MED ORDER — CARVEDILOL 6.25 MG PO TABS: ORAL_TABLET | ORAL | 3 refills | Status: DC

## 2019-10-17 NOTE — Progress Notes (Signed)
   Subjective:    Patient ID: Daniel Romero, male    DOB: 03/29/1960, 59 y.o.   MRN: [id number]  HPI He is here for complete examination.  He does have an underlying history of atrial fibrillation as well as a cardiomyopathy.  He was given a cardioversion on more than one occasion as well as chemotherapy for this and eventually was brought back into a regular rhythm.  He did start taking his Xarelto again because he feared recurrence of the A. fib.  Apparently his heart rate did drop to 50 but the monitoring device gave equivocal results.  Presently he is on lisinopril/HCTZ as well as Coreg.  There is a family history of diabetes.  He also has 9 years alcohol free and plans to start back into AA meetings.  Otherwise he has no concerns or complaints.  Family and social history as well as health maintenance and immunizations was reviewed.  He is single but dating a woman.  He is not concerned about STD.  Review of Systems  All other systems reviewed and are negative.      Objective:   Physical Exam Alert and in no distress. Tympanic membranes and canals are normal. Pharyngeal area is normal. Neck is supple without adenopathy or thyromegaly. Cardiac exam shows a regular sinus rhythm without murmurs or gallops. Lungs are clear to auscultation.  Abdominal exam shows no masses or tenderness        Assessment & Plan:  Routine general medical examination at a health care facility - Plan: Comprehensive metabolic panel, CBC with Differential/Platelet, Lipid panel  Essential hypertension - Plan: Comprehensive metabolic panel, CBC with Differential/Platelet, carvedilol (COREG) 6.25 MG tablet, lisinopril-hydrochlorothiazide (ZESTORETIC) 10-12.5 MG tablet  Family history of diabetes mellitus - Plan: Comprehensive metabolic panel  Other cardiomyopathy ([place]) - Plan: carvedilol (COREG) 6.25 MG tablet  Alcohol abuse, in remission  History of atrial fibrillation  Screening for prostate cancer -  Plan: PSA He will continue on his present medication regimen but will stop the Xarelto.  Discussed how to check his pulse with the machine that he has at home plus checking his wrist or carotid artery.  If he has any questions concerning whether he is back into A. fib, he will probably start the Xarelto again but then check with cardiology or me for follow-up on this. Congratulated him on continuing to remain alcohol free. Briefly discussed prostate cancer screening with him.

## 2019-10-21 LAB — CBC WITH DIFFERENTIAL/PLATELET
Basophils Absolute: 0.1 10*3/uL (ref 0.0–0.2)
Basos: 1 %
EOS (ABSOLUTE): 0.4 10*3/uL (ref 0.0–0.4)
Eos: 4 %
Hematocrit: 48.4 % (ref 37.5–51.0)
Hemoglobin: 15.5 g/dL (ref 13.0–17.7)
Immature Grans (Abs): 0.1 10*3/uL (ref 0.0–0.1)
Immature Granulocytes: 1 %
Lymphocytes Absolute: 1.8 10*3/uL (ref 0.7–3.1)
Lymphs: 20 %
MCH: 30.2 pg (ref 26.6–33.0)
MCHC: 32 g/dL (ref 31.5–35.7)
MCV: 94 fL (ref 79–97)
Monocytes Absolute: 0.8 10*3/uL (ref 0.1–0.9)
Monocytes: 9 %
Neutrophils Absolute: 6 10*3/uL (ref 1.4–7.0)
Neutrophils: 65 %
Platelets: 323 10*3/uL (ref 150–450)
RBC: 5.13 x10E6/uL (ref 4.14–5.80)
RDW: 13.7 % (ref 11.6–15.4)
WBC: 9 10*3/uL (ref 3.4–10.8)

## 2019-10-21 LAB — LIPID PANEL
Chol/HDL Ratio: 3.8 ratio (ref 0.0–5.0)
Cholesterol, Total: 211 mg/dL — ABNORMAL HIGH (ref 100–199)
HDL: 55 mg/dL (ref >39)
LDL Chol Calc (NIH): 133 mg/dL — ABNORMAL HIGH (ref 0–99)
Triglycerides: 131 mg/dL (ref 0–149)
VLDL Cholesterol Cal: 23 mg/dL (ref 5–40)

## 2019-10-21 LAB — COMPREHENSIVE METABOLIC PANEL
ALT: 27 IU/L (ref 0–44)
AST: 23 IU/L (ref 0–40)
Albumin/Globulin Ratio: 2.2 (ref 1.2–2.2)
Albumin: 4.7 g/dL (ref 3.8–4.9)
Alkaline Phosphatase: 67 IU/L (ref 48–121)
BUN/Creatinine Ratio: 11 (ref 9–20)
BUN: 11 mg/dL (ref 6–24)
Bilirubin Total: 0.4 mg/dL (ref 0.0–1.2)
CO2: 23 mmol/L (ref 20–29)
Calcium: 9.7 mg/dL (ref 8.7–10.2)
Chloride: 100 mmol/L (ref 96–106)
Creatinine, Ser: 1 mg/dL (ref 0.76–1.27)
GFR calc Af Amer: 97 mL/min/{1.73_m2} (ref >59)
GFR calc non Af Amer: 84 mL/min/{1.73_m2} (ref >59)
Globulin, Total: 2.1 g/dL (ref 1.5–4.5)
Glucose: 87 mg/dL (ref 65–99)
Potassium: 4.2 mmol/L (ref 3.5–5.2)
Sodium: 140 mmol/L (ref 134–144)
Total Protein: 6.8 g/dL (ref 6.0–8.5)

## 2019-10-21 LAB — PSA: Prostate Specific Ag, Serum: 0.7 ng/mL (ref 0.0–4.0)

## 2019-10-23 ENCOUNTER — Other Ambulatory Visit: Payer: Self-pay

## 2019-11-02 ENCOUNTER — Other Ambulatory Visit: Payer: Self-pay | Admitting: Physician Assistant

## 2019-11-02 DIAGNOSIS — I428 Other cardiomyopathies: Secondary | ICD-10-CM

## 2019-11-02 DIAGNOSIS — I10 Essential (primary) hypertension: Secondary | ICD-10-CM

## 2019-11-20 ENCOUNTER — Ambulatory Visit: Payer: 59 | Admitting: Psychiatry

## 2019-12-12 IMAGING — CT CT HEART MORPH/PULM VEIN W/ CM & W/O CA SCORE
2 of 9 series · 6 of 20 positions shown, 7 images · non-contrast
Comparison: None.

Addendum:
EXAM:
OVER-READ INTERPRETATION  CT CHEST

The following report is an over-read performed by radiologist Dr.
Edgar Rafael Billagran [REDACTED] on 05/04/2018. This
over-read does not include interpretation of cardiac or coronary
anatomy or pathology. The coronary calcium score/coronary CTA
interpretation by the cardiologist is attached.
CLINICAL DATA: Atrial fibrillation scheduled for an ablation.
Cardiac CT/CTA
TECHNIQUE: The patient was scanned on a Siemens Somatom scanner.

[Series 11: best diast · axial · 0.39mm/px · z∈[+1183,+1241]mm · 3 of 294 slices shown, 4 images]
[im 74/294  vessel]
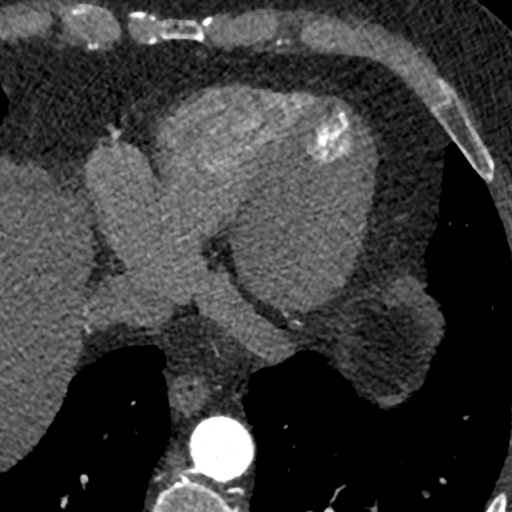
[im 74/294  lung]
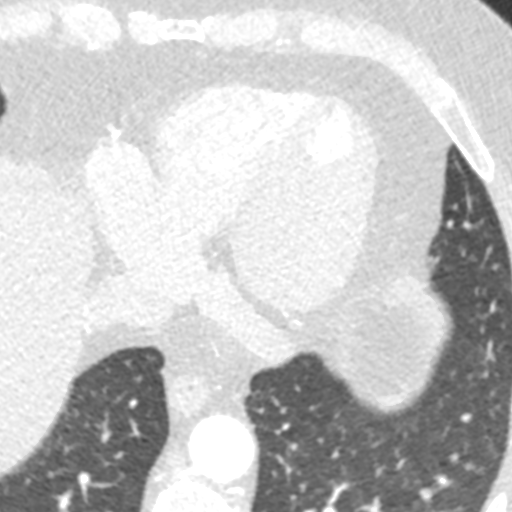
[im 147/294  vessel]
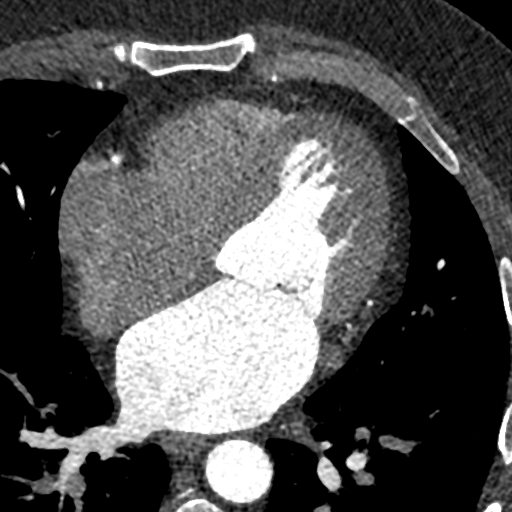
[im 220/294  vessel]
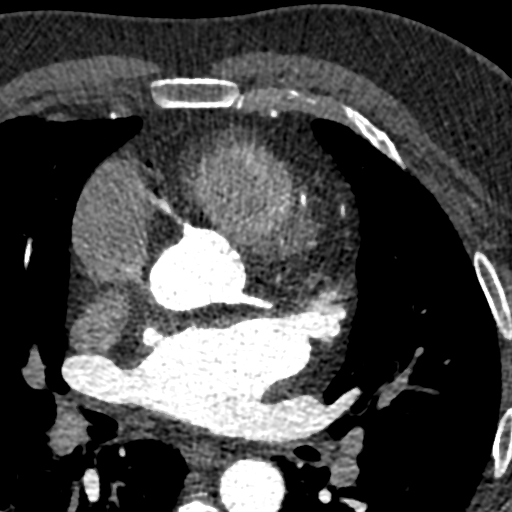

[Series 12: +300 ms · axial · 0.39mm/px · z∈[+1183,+1241]mm · 3 of 294 slices shown]
[im 74/294  vessel]
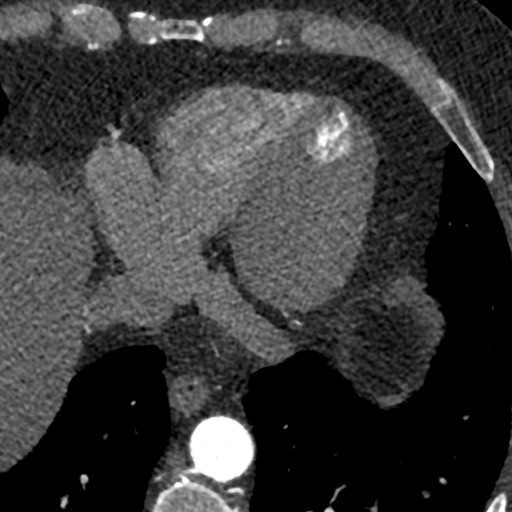
[im 147/294  vessel]
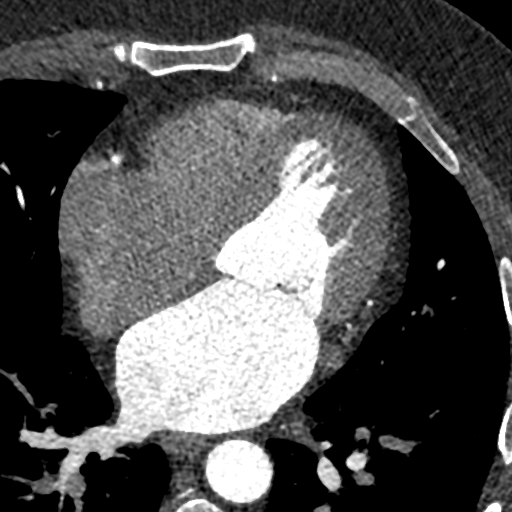
[im 220/294  vessel]
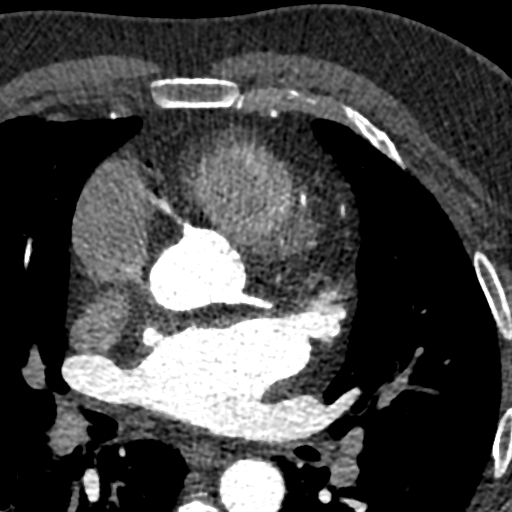

[6 of 20 positions shown; findings below may reference images not displayed]

FINDINGS: Within the visualized portions of the thorax there are no suspicious
appearing pulmonary nodules or masses, there is no acute
consolidative airspace disease, no pleural effusions, no
pneumothorax and no lymphadenopathy. Visualized portions of the
upper abdomen demonstrates mild diffuse low attenuation throughout
the visualized hepatic parenchyma, indicative of hepatic steatosis..
There are no aggressive appearing lytic or blastic lesions noted in
the visualized portions of the skeleton.
IMPRESSION: 1. Hepatic steatosis.
FINDINGS: A 120 kV prospective scan was triggered in the descending thoracic
aorta at 111 HU's. Gantry rotation speed was 280 msecs and
collimation was .9 mm. No beta blockade and no NTG was given. The 3D
data set was reconstructed in 5% intervals of the 60-80 % of the R-R
cycle. Diastolic phases were analyzed on a dedicated work station
using MPR, MIP and VRT modes. The patient received 80 cc of
contrast.

There is normal pulmonary vein drainage into the left atrium (2 on
the right and 2 on the left) with ostial measurements as follows:

RUPV: 23.3 x 21 mm

RLPV: 20.6 x 16.6 mm

The left sided pulmonary veins share a common antrum prior to
draining into the left atrium.

LUPV: 17.4  X 15.4  mm

LLPV: 16.8 x 15.7 mm

No anomalous pulmonary venous drainage.

Normal left atrial appendage, no left atrial appendage thrombus. No
intracardiac mass or thrombus.

Moderate LA enlargement, mild RA enlargement.

The esophagus runs in the left atrial midline and is adjacent to the
ostium of the RLPV.

Aorta:  Normal caliber.  No dissection or calcifications.

Aortic Valve:  Trileaflet.  No calcifications.

Coronary Arteries: Normal coronary origin. Right dominance. The
study was performed without use of NTG and insufficient for plaque
evaluation.

Calcium score 18, calcium in distal RCA. This is 58th percentile
compared to age and sex matched peers.

Pulmonary artery: Normal caliber main pulmonary artery.

Grossly normal LV and RV function.
IMPRESSION: 1. There is normal pulmonary vein drainage into the left atrium. No
pulmonary vein stenosis.

2. Normal left atrial appendage, no left atrial appendage thrombus.
No intracardiac mass or thrombus.

3. The esophagus runs in the left atrial midline and is adjacent to
the ostium of the RLPV.

4. Coronary calcium score 18, calcium in distal RCA. This is 58th
percentile compared to age and sex matched peers.

*** End of Addendum ***

## 2020-04-03 ENCOUNTER — Encounter: Payer: Self-pay | Admitting: Psychiatry

## 2020-04-03 ENCOUNTER — Ambulatory Visit (INDEPENDENT_AMBULATORY_CARE_PROVIDER_SITE_OTHER): Payer: 59 | Admitting: Psychiatry

## 2020-04-03 ENCOUNTER — Other Ambulatory Visit: Payer: Self-pay

## 2020-04-03 DIAGNOSIS — F4323 Adjustment disorder with mixed anxiety and depressed mood: Secondary | ICD-10-CM

## 2020-04-03 NOTE — Progress Notes (Signed)
      [facility] Counselor/Therapist Progress Note  Patient ID: Daniel Romero, MRN: [id number],    Date: 04/03/2020  Time Spent: 50 minutes   Treatment Type: Individual Therapy  Reported Symptoms: anxious, sad  Mental Status Exam:  Appearance:   Casual     Behavior:  Appropriate  Motor:  Normal  Speech/Language:   Clear and Coherent  Affect:  Appropriate  Mood:  anxious and sad  Thought process:  normal  Thought content:    WNL  Sensory/Perceptual disturbances:    WNL  Orientation:  oriented to person, place, time/date and situation  Attention:  Good  Concentration:  Good  Memory:  WNL  Fund of knowledge:   Good  Insight:    Good  Judgment:   Good  Impulse Control:  Good   Risk Assessment: Danger to Self:  No Self-injurious Behavior: No Danger to Others: No Duty to Warn:no Physical Aggression / Violence:No  Access to Firearms a concern: No  Gang Involvement:No   Subjective: The client states that he has sold his parents properties.  This will close out the rest of the estate.  As the client began to talk about this we used the bilateral stimulation hand paddles to help him process his sadness and anxiety as he lets go of his childhood home.  The client discussed as they were cleaning out the house that he was overcome with grief and sadness.  He described placing his hand on the windowsill in his room where he would smell the fresh Summer air and hear the sound of the stream in their backyard as a child.  He became very tearful at this point.  He states it has been hard to let it all go.  "I do not think my parents ever thought it would come to an end."  He has had some conflict with his sister.  Once the sale is complete the funds can be dispersed and he believes his sister will not harass him anymore. Client states that his relationship with his girlfriend is going through a "slow painful withdrawal."  He states that she continues to try to control who he sees and  who he does not see.  She lives in [place] and would like him to move there so they can live together.  The client states he has no plans on doing that.  He states she continues to be upset that he has contact with a previous girlfriend who is now in her late 22s.  He has no romantic interest in this friend but does help her out from time to time.  We discussed assertively setting those boundaries with her.  I also asked the client to evaluate if he really wanted to continue to stay in the relationship?  He agreed to do so. At next session the client wants to address his guilt over his dad's last night alive.  Interventions: Assertiveness/Communication, Motivational Interviewing, Solution-Oriented/Positive Psychology, [employer] Desensitization and Reprocessing (EMDR) and Insight-Oriented  Diagnosis:   ICD-10-CM   1. Adjustment disorder with mixed anxiety and depressed mood  F43.23     Plan: Positive self talk, self-care, boundaries, assertiveness, acceptance.  Lizzet Hendley, [facility]

## 2020-04-07 ENCOUNTER — Other Ambulatory Visit: Payer: Self-pay

## 2020-04-07 ENCOUNTER — Encounter: Payer: Self-pay | Admitting: Family Medicine

## 2020-04-07 ENCOUNTER — Ambulatory Visit: Payer: 59 | Admitting: Family Medicine

## 2020-04-07 VITALS — BP 120/88 | HR 67 | Temp 98.4°F | Wt 240.0 lb

## 2020-04-07 DIAGNOSIS — G8929 Other chronic pain: Secondary | ICD-10-CM

## 2020-04-07 DIAGNOSIS — M25562 Pain in left knee: Secondary | ICD-10-CM | POA: Diagnosis not present

## 2020-04-07 NOTE — Patient Instructions (Signed)
You can take 2 Tylenol 4 times per day and if you need more relief then you can add 2 Aleve twice per day.  Go ahead and use of Voltaren topical as recommended and wash your hands after use

## 2020-04-07 NOTE — Progress Notes (Signed)
   Subjective:    Patient ID: Daniel Romero, male    DOB: 07/14/1960, 60 y.o.   MRN: [id number]  HPI He complains of a 24-month history of left medial knee pain.  The pain seems to be getting worse.  No history of injury or overuse, popping, locking or grinding.  He states that he thinks he feels a knot medially.   Review of Systems     Objective:   Physical Exam Alert and in no distress.  Exam of the left knee shows no palpable tenderness, effusion.  Negative anterior drawer and McMurray's testing.  Medial and lateral collateral ligaments intact.       Assessment & Plan:  Chronic pain of left knee - Plan: DG Knee Complete 4 Views Left I explained that this could be chronic degenerative changes are possible arthritic changes.  Recommend supportive care with Tylenol and possibly an NSAID of choice.  Further discussion after x-rays are back concerning possible injection versus referral.

## 2020-04-08 ENCOUNTER — Ambulatory Visit
Admission: RE | Admit: 2020-04-08 | Discharge: 2020-04-08 | Disposition: A | Discharge status: Home / Self Care | Payer: 59 | Source: Ambulatory Visit | Attending: Family Medicine | Admitting: Family Medicine

## 2020-04-14 ENCOUNTER — Ambulatory Visit: Payer: 59 | Admitting: Family Medicine

## 2020-04-14 ENCOUNTER — Other Ambulatory Visit: Payer: Self-pay

## 2020-04-14 VITALS — BP 110/76 | HR 79 | Temp 98.2°F | Wt 240.6 lb

## 2020-04-14 DIAGNOSIS — M25562 Pain in left knee: Secondary | ICD-10-CM | POA: Diagnosis not present

## 2020-04-14 MED ORDER — TRIAMCINOLONE ACETONIDE 40 MG/ML IJ SUSP
40.0000 mg | Freq: Once | INTRAMUSCULAR | Status: AC
  Administered 2020-04-14: 40 mg via INTRAMUSCULAR

## 2020-04-14 MED ORDER — LIDOCAINE HCL 1 % IJ SOLN
10.0000 mL | Freq: Once | INTRAMUSCULAR | Status: AC
  Administered 2020-04-14: 10 mL via INTRADERMAL

## 2020-04-14 NOTE — Progress Notes (Signed)
   Subjective:    Patient ID: Daniel Romero, male    DOB: [birth year], 60 y.o.   MRN: [id number]  HPI He is here for recheck on his left knee pain. He states that that is interfering with his ADLs. He does a lot of walking at work and by the end of the day the knee is causing difficulty. There was a recent x-ray which was essentially negative for any major problems. He is interested in getting an injection.   Review of Systems     Objective:   Physical Exam Alert and in no distress. Left knee exam shows no effusion or palpable tenderness.       Assessment & Plan:  Left knee pain, unspecified chronicity - Plan: triamcinolone acetonide (KENALOG-40) injection 40 mg, lidocaine (XYLOCAINE) 1 % (with pres) injection 10 mL, Ambulatory referral to Physical Therapy The knee was prepped laterally. The joint line was identified. 40 mg of Kenalog and 3 cc of Xylocaine was injected into the joint without difficulty. He did experience relatively quick relief of his symptoms. I then explained that the best care for this long-term would be a good rehab program for his knee and I will make that referral.

## 2020-04-17 ENCOUNTER — Ambulatory Visit: Payer: 59 | Admitting: Physical Therapy

## 2020-04-17 ENCOUNTER — Other Ambulatory Visit: Payer: Self-pay

## 2020-04-17 ENCOUNTER — Encounter: Payer: Self-pay | Admitting: Physical Therapy

## 2020-04-17 DIAGNOSIS — R2689 Other abnormalities of gait and mobility: Secondary | ICD-10-CM | POA: Diagnosis not present

## 2020-04-17 DIAGNOSIS — M25662 Stiffness of left knee, not elsewhere classified: Secondary | ICD-10-CM

## 2020-04-17 DIAGNOSIS — M25562 Pain in left knee: Secondary | ICD-10-CM | POA: Diagnosis not present

## 2020-04-17 DIAGNOSIS — M6281 Muscle weakness (generalized): Secondary | ICD-10-CM | POA: Diagnosis not present

## 2020-04-17 NOTE — Patient Instructions (Signed)
Access Code: [email or web address] URL: [email or web address] Date: 04/17/2020 Prepared by: Elsie Ra  Exercises Supine Hamstring Stretch with Strap - 2 x daily - 6 x weekly - 1 sets - 3 reps - 30 hold Supine Quadriceps Stretch with Strap on Table - 2 x daily - 6 x weekly - 3 reps - 30 hold Supine ITB Stretch with Strap - 2 x daily - 6 x weekly - 2-3 reps - 30 hold Supine Heel Slide with Strap - 2 x daily - 6 x weekly - 1-2 sets - 10 reps - 5 hold Straight Leg Raise with External Rotation - 2 x daily - 6 x weekly - 2-3 sets - 10 reps Seated Long Arc Quad with Hip Adduction - 2 x daily - 6 x weekly - 2-3 sets - 10 reps - 5 sec hold Wall Squat Hold with Ball - 2 x daily - 6 x weekly - 1-2 sets - 10 reps - 5 sec hold

## 2020-04-17 NOTE — Therapy (Signed)
[facility] Physical Therapy [address] [place], [place], [address] Phone: [phone]   Fax:  [phone]  Physical Therapy Evaluation  Patient Details  Name: Daniel Romero MRN: [id number] Date of Birth: Jan 15, 1961 Referring Provider (PT): Dr. Jill Alexanders   Encounter Date: 04/17/2020   PT End of Session - 04/17/20 1632    Visit Number 1    Number of Visits 12    Date for PT Re-Evaluation 05/29/20    Authorization Type UHC    PT Start Time 1430    PT Stop Time 1515    PT Time Calculation (min) 45 min    Activity Tolerance Patient tolerated treatment well    Behavior During Therapy [facility] for tasks assessed/performed           Past Medical History:  Diagnosis Date  . Anginal pain ([place])   . Chronic systolic dysfunction of left ventricle   . Depression   . Hypertension   . Obesity   . Persistent atrial fibrillation ([place])   . Seasonal allergies   . Skin cancer 04/2018   BACK    Past Surgical History:  Procedure Laterality Date  . ATRIAL FIBRILLATION ABLATION  05/08/2018  . ATRIAL FIBRILLATION ABLATION N/A 05/08/2018   Procedure: ATRIAL FIBRILLATION ABLATION;  Surgeon: Thompson Grayer, MD;  Location: [place] CV LAB;  Service: Cardiovascular;  Laterality: N/A;  . CARDIOVERSION N/A 12/01/2017   Procedure: CARDIOVERSION;  Surgeon: Larey Dresser, MD;  Location: [facility] ENDOSCOPY;  Service: Cardiovascular;  Laterality: N/A;  . CARDIOVERSION N/A 03/01/2018   Procedure: CARDIOVERSION;  Surgeon: Elouise Munroe, MD;  Location: [facility] ENDOSCOPY;  Service: Cardiovascular;  Laterality: N/A;  . CARDIOVERSION N/A 04/06/2018   Procedure: CARDIOVERSION;  Surgeon: Thayer Headings, MD;  Location: [place];  Service: Cardiovascular;  Laterality: N/A;  . COLONOSCOPY  ~ 2015  . SKIN BIOPSY Left 03/30/2018   squamous cell carcinoma in situ  . WISDOM TOOTH EXTRACTION      There were no vitals filed for this visit.    Subjective Assessment - 04/17/20 1437     Subjective He relays Lt medial knee pain over the last 3 months that gradually came on without known injury. He does relay he has to walk on concrete floors 8+ hours a day as he works in [employer]. His pain is aggravated more by walking more than 30 minutes and he has pain at night which bothers his sleeping. Pain has improved some with injection and meds.    Pertinent History HTN, Depression, 2020 skin cancer back, obesity    Limitations Standing;Walking    How long can you stand comfortably? 30 min    How long can you walk comfortably? 30 min    Diagnostic tests xrays showed chronic degenerative changes but otherwise negative    Patient Stated Goals get the pain down    Currently in Pain? Yes    Pain Score 1    currently one, but before the injection it was 8/10   Pain Location Knee    Pain Orientation Left;Medial    Pain Descriptors / Indicators Burning;Stabbing    Pain Type Chronic pain   subacute   Pain Radiating Towards denies    Pain Onset More than a month ago    Pain Frequency Intermittent              [facility] PT Assessment - 04/17/20 0001      Assessment   Medical Diagnosis subacute Lt knee pain  Referring Provider (PT) Dr. Jill Alexanders    Onset Date/Surgical Date --   3 month onset of pain   Next MD Visit PRN      Restrictions   Weight Bearing Restrictions No      Balance Screen   Has the patient fallen in the past 6 months No    Has the patient had a decrease in activity level because of a fear of falling?  No    Is the patient reluctant to leave their home because of a fear of falling?  No      Home Ecologist residence      Prior Function   Level of Independence Independent    Vocation Full time employment    Vocation Requirements standing for 8 hours at [employer]   Overall Cognitive Status Within [place] for tasks assessed      Observation/Other Assessments   Focus on Therapeutic  Outcomes (FOTO)  52% functional, goal is 69%      Functional Tests   Functional tests Single leg stance      Single Leg Stance   Comments Rt leg 20 sec, Lt leg 10 sec and increased sway      ROM / Strength   AROM / PROM / Strength AROM;Strength      AROM   AROM Assessment Site Knee    Right/Left Knee Right;Left    Right Knee Extension 0    Right Knee Flexion 130    Left Knee Extension 0    Left Knee Flexion 110      Strength   Overall Strength Comments Rt hip/knee overall 5/5, left knee extension 5-, left hip adduction 5-, all others 5      Flexibility   Soft Tissue Assessment /Muscle Length --   very tight quad, H.S, IT band and left side all worse than Rt     Palpation   Patella mobility WNL patella mobility but slight lateral tracking    Palpation comment TTP medial knee jt line      Special Tests   Other special tests + patella grind test on left that was negative on Rt      Transfers   Transfers Independent with all Transfers      Ambulation/Gait   Gait Comments ambulates independently no AD, community ambulator, does show decreased stance time on Lt                      Objective measurements completed on examination: See above findings.       [place] Adult PT Treatment/Exercise - 04/17/20 0001      Exercises   Exercises Knee/Hip      Knee/Hip Exercises: Aerobic   Recumbent Bike L3 X 12 min with HEP creation and review                  PT Education - 04/17/20 1632    Education Details HEP, POC, general nutriton education    Person(s) Educated Patient    Methods Explanation;Demonstration;Verbal cues;Handout    Comprehension Verbalized understanding;Need further instruction               PT Long Term Goals - 04/17/20 1637      PT LONG TERM GOAL #1   Title Pt will be I and compliant with HEP.    Time 6    Period Weeks    Status New  Target Date 05/29/20      PT LONG TERM GOAL #2   Title Pt will improve FOTO to at  least 69%    Time 6    Period Weeks    Status New      PT LONG TERM GOAL #3   Title Pt will improve Lt knee ROM to at least 120 deg to improve funtion.    Baseline 110    Time 6    Period Weeks    Status New      PT LONG TERM GOAL #4   Title Pt will report overall less than 2-3/10 pain with usual work duties and ADLs.    Time 6    Period Weeks    Status New                  Plan - 04/17/20 1633    Clinical Impression Statement Pt presents with subacute medial left knee pain for last 3 months. Presentswith signs and symptoms  consistent with as patella femoral pain syndrome. He will benefit from skilled PT to address his functional deficits listed below.    Personal Factors and Comorbidities Comorbidity 2    Comorbidities HTN, Depression, 2020 skin cancer back, obesity    Examination-Activity Limitations Bend;Squat;Stairs;Lift;Stand;Locomotion Level    Examination-Participation Restrictions Community Activity;Occupation;Yard Work    Stability/Clinical Decision Making Stable/Uncomplicated    Designer, jewellery Low    Rehab Potential Good    PT Frequency 2x / week   1-2   PT Duration 6 weeks    PT Treatment/Interventions ADLs/Self Care Home Management;Aquatic Therapy;Cryotherapy;Electrical Stimulation;Iontophoresis 4mg /ml Dexamethasone;Moist Heat;Ultrasound;Gait training;Therapeutic activities;Therapeutic exercise;Neuromuscular re-education;Manual techniques;Dry needling;Passive range of motion;Taping;Joint Manipulations;Vasopneumatic Device    PT Next Visit Plan review and updat HEP PRN, Lt knee ROM, strength, stability    PT Home Exercise Plan Access Code: [email or web address]    Consulted and Agree with Plan of Care Patient           Patient will benefit from skilled therapeutic intervention in order to improve the following deficits and impairments:  Decreased activity tolerance,Decreased range of motion,Decreased strength,Difficulty walking,Increased edema,Impaired  flexibility,Pain  Visit Diagnosis: Acute pain of left knee  Stiffness of left knee, not elsewhere classified  Other abnormalities of gait and mobility  Muscle weakness (generalized)     Problem List Patient Active Problem List   Diagnosis Date Noted  . Family history of diabetes mellitus 10/16/2018  . Cardiomyopathy ([place]) 07/25/2018  . History of atrial fibrillation 11/08/2017  . Routine general medical examination at a health care facility 10/13/2017  . Essential hypertension 06/03/2013  . Alcohol abuse, in remission 06/03/2013  . Former smoker 06/03/2013    Silvestre Mesi 04/17/2020, 4:39 PM  [facility] Physical Therapy [address] [place], [place], [address] Phone: [phone]   Fax:  [phone]  Name: Daniel Romero MRN: [id number] Date of Birth: 1960/09/16

## 2020-04-27 ENCOUNTER — Other Ambulatory Visit: Payer: Self-pay

## 2020-04-27 ENCOUNTER — Ambulatory Visit: Payer: 59 | Admitting: Physical Therapy

## 2020-04-27 DIAGNOSIS — M25662 Stiffness of left knee, not elsewhere classified: Secondary | ICD-10-CM

## 2020-04-27 DIAGNOSIS — R2689 Other abnormalities of gait and mobility: Secondary | ICD-10-CM

## 2020-04-27 DIAGNOSIS — M25562 Pain in left knee: Secondary | ICD-10-CM | POA: Diagnosis not present

## 2020-04-27 DIAGNOSIS — M6281 Muscle weakness (generalized): Secondary | ICD-10-CM

## 2020-04-27 NOTE — Therapy (Signed)
[facility] Physical Therapy [address] [place], [place], [address] Phone: [phone]   Fax:  [phone]  Physical Therapy Treatment  Patient Details  Name: Daniel Romero MRN: [id number] Date of Birth: 01-14-1961 Referring Provider (PT): Dr. Jill Alexanders   Encounter Date: 04/27/2020   PT End of Session - 04/27/20 1514    Visit Number 2    Number of Visits 12    Date for PT Re-Evaluation 05/29/20    Authorization Type UHC    PT Start Time 8182    PT Stop Time 1514    PT Time Calculation (min) 42 min    Activity Tolerance Patient tolerated treatment well    Behavior During Therapy [facility] for tasks assessed/performed           Past Medical History:  Diagnosis Date  . Anginal pain ([place])   . Chronic systolic dysfunction of left ventricle   . Depression   . Hypertension   . Obesity   . Persistent atrial fibrillation ([place])   . Seasonal allergies   . Skin cancer 04/2018   BACK    Past Surgical History:  Procedure Laterality Date  . ATRIAL FIBRILLATION ABLATION  05/08/2018  . ATRIAL FIBRILLATION ABLATION N/A 05/08/2018   Procedure: ATRIAL FIBRILLATION ABLATION;  Surgeon: Thompson Grayer, MD;  Location: [place] CV LAB;  Service: Cardiovascular;  Laterality: N/A;  . CARDIOVERSION N/A 12/01/2017   Procedure: CARDIOVERSION;  Surgeon: Larey Dresser, MD;  Location: [facility] ENDOSCOPY;  Service: Cardiovascular;  Laterality: N/A;  . CARDIOVERSION N/A 03/01/2018   Procedure: CARDIOVERSION;  Surgeon: Elouise Munroe, MD;  Location: [facility] ENDOSCOPY;  Service: Cardiovascular;  Laterality: N/A;  . CARDIOVERSION N/A 04/06/2018   Procedure: CARDIOVERSION;  Surgeon: Thayer Headings, MD;  Location: [place];  Service: Cardiovascular;  Laterality: N/A;  . COLONOSCOPY  ~ 2015  . SKIN BIOPSY Left 03/30/2018   squamous cell carcinoma in situ  . WISDOM TOOTH EXTRACTION      There were no vitals filed for this visit.   Subjective Assessment - 04/27/20 1439     Subjective he denies knee pain upon arrival but does still have occasional twinges of pain at times    Pertinent History HTN, Depression, 2020 skin cancer back, obesity    Limitations Standing;Walking    How long can you stand comfortably? 30 min    How long can you walk comfortably? 30 min    Diagnostic tests xrays showed chronic degenerative changes but otherwise negative    Patient Stated Goals get the pain down    Pain Onset More than a month ago                             [facility] Adult PT Treatment/Exercise - 04/27/20 0001      Knee/Hip Exercises: Stretches   Active Hamstring Stretch Left;2 reps;30 seconds    Quad Stretch Left;3 reps;30 seconds    Quad Stretch Limitations supine with strap    Hip Flexor Stretch Limitations seated tailgate stretch 10 sec X 10 reps    ITB Stretch Left;2 reps;30 seconds    ITB Stretch Limitations supine with strap      Knee/Hip Exercises: Aerobic   Recumbent Bike L3 X 10 min      Knee/Hip Exercises: Seated   Long Arc Quad Both;15 reps    Long Arc Quad Weight 3 lbs.    Long [employer] Limitations holding 5 sec with ball squeeze  Sit to Sand 10 reps;without UE support   slight raised mat and ball squeeze     Knee/Hip Exercises: Supine   Short Arc [employer] 15 reps    Short Arc Quad Sets Limitations with ball squeeze and 3# on Lt    Bridges with [employer] 15 reps   holding 5 sec   Straight Leg Raise with External Rotation Left;2 sets;10 reps                       PT Long Term Goals - 04/17/20 1637      PT LONG TERM GOAL #1   Title Pt will be I and compliant with HEP.    Time 6    Period Weeks    Status New    Target Date 05/29/20      PT LONG TERM GOAL #2   Title Pt will improve FOTO to at least 69%    Time 6    Period Weeks    Status New      PT LONG TERM GOAL #3   Title Pt will improve Lt knee ROM to at least 120 deg to improve funtion.    Baseline 110    Time 6    Period Weeks    Status New       PT LONG TERM GOAL #4   Title Pt will report overall less than 2-3/10 pain with usual work duties and ADLs.    Time 6    Period Weeks    Status New                 Plan - 04/27/20 1515    Clinical Impression Statement He has been doing his HEP although not as consistent as recommended but he is showing good early progress. He had good tolerance to strenght progression today without complaints.    Personal Factors and Comorbidities Comorbidity 2    Comorbidities HTN, Depression, 2020 skin cancer back, obesity    Examination-Activity Limitations Bend;Squat;Stairs;Lift;Stand;Locomotion Level    Examination-Participation Restrictions Community Activity;Occupation;Yard Work    Stability/Clinical Decision Making Stable/Uncomplicated    Rehab Potential Good    PT Frequency 2x / week   1-2   PT Duration 6 weeks    PT Treatment/Interventions ADLs/Self Care Home Management;Aquatic Therapy;Cryotherapy;Electrical Stimulation;Iontophoresis 4mg /ml Dexamethasone;Moist Heat;Ultrasound;Gait training;Therapeutic activities;Therapeutic exercise;Neuromuscular re-education;Manual techniques;Dry needling;Passive range of motion;Taping;Joint Manipulations;Vasopneumatic Device    PT Next Visit Plan review and updat HEP PRN, Lt knee ROM, strength, stability    PT Home Exercise Plan Access Code: [email or web address]    Consulted and Agree with Plan of Care Patient           Patient will benefit from skilled therapeutic intervention in order to improve the following deficits and impairments:  Decreased activity tolerance,Decreased range of motion,Decreased strength,Difficulty walking,Increased edema,Impaired flexibility,Pain  Visit Diagnosis: Acute pain of left knee  Stiffness of left knee, not elsewhere classified  Other abnormalities of gait and mobility  Muscle weakness (generalized)     Problem List Patient Active Problem List   Diagnosis Date Noted  . Family history of diabetes mellitus  10/16/2018  . Cardiomyopathy ([place]) 07/25/2018  . History of atrial fibrillation 11/08/2017  . Routine general medical examination at a health care facility 10/13/2017  . Essential hypertension 06/03/2013  . Alcohol abuse, in remission 06/03/2013  . Former smoker 06/03/2013    Debbe Odea, PT,DPT 04/27/2020, 3:17 PM  [facility] Physical Therapy [place], [place],  [address] Phone: [phone]   Fax:  [phone]  Name: Daniel Romero MRN: [id number] Date of Birth: [birth year]

## 2020-05-06 ENCOUNTER — Ambulatory Visit: Payer: 59 | Admitting: Physical Therapy

## 2020-05-06 ENCOUNTER — Other Ambulatory Visit: Payer: Self-pay

## 2020-05-06 DIAGNOSIS — M25562 Pain in left knee: Secondary | ICD-10-CM

## 2020-05-06 DIAGNOSIS — M6281 Muscle weakness (generalized): Secondary | ICD-10-CM

## 2020-05-06 DIAGNOSIS — M25662 Stiffness of left knee, not elsewhere classified: Secondary | ICD-10-CM

## 2020-05-06 NOTE — Therapy (Signed)
[facility] Physical Therapy [address] [place], [place], [address] Phone: [phone]   Fax:  [phone]  Physical Therapy Treatment  Patient Details  Name: Daniel Romero MRN: [id number] Date of Birth: [birth year] Referring Provider (PT): Dr. Jill Alexanders   Encounter Date: 05/06/2020   PT End of Session - 05/06/20 1515    Visit Number 3    Number of Visits 12    Date for PT Re-Evaluation 05/29/20    Authorization Type UHC    PT Start Time 2993    PT Stop Time 1546    PT Time Calculation (min) 40 min    Activity Tolerance Patient tolerated treatment well    Behavior During Therapy [facility] for tasks assessed/performed           Past Medical History:  Diagnosis Date  . Anginal pain ([place])   . Chronic systolic dysfunction of left ventricle   . Depression   . Hypertension   . Obesity   . Persistent atrial fibrillation ([place])   . Seasonal allergies   . Skin cancer 04/2018   BACK    Past Surgical History:  Procedure Laterality Date  . ATRIAL FIBRILLATION ABLATION  05/08/2018  . ATRIAL FIBRILLATION ABLATION N/A 05/08/2018   Procedure: ATRIAL FIBRILLATION ABLATION;  Surgeon: Thompson Grayer, MD;  Location: [place] CV LAB;  Service: Cardiovascular;  Laterality: N/A;  . CARDIOVERSION N/A 12/01/2017   Procedure: CARDIOVERSION;  Surgeon: Larey Dresser, MD;  Location: [facility] ENDOSCOPY;  Service: Cardiovascular;  Laterality: N/A;  . CARDIOVERSION N/A 03/01/2018   Procedure: CARDIOVERSION;  Surgeon: Elouise Munroe, MD;  Location: [facility] ENDOSCOPY;  Service: Cardiovascular;  Laterality: N/A;  . CARDIOVERSION N/A 04/06/2018   Procedure: CARDIOVERSION;  Surgeon: Thayer Headings, MD;  Location: [place];  Service: Cardiovascular;  Laterality: N/A;  . COLONOSCOPY  ~ 2015  . SKIN BIOPSY Left 03/30/2018   squamous cell carcinoma in situ  . WISDOM TOOTH EXTRACTION      There were no vitals filed for this visit.   Subjective Assessment - 05/06/20 1510     Subjective he relays about 3 out of 10 knee pain that is more medial knee and after he walks for too long    Pertinent History HTN, Depression, 2020 skin cancer back, obesity    Limitations Standing;Walking    How long can you stand comfortably? 30 min    How long can you walk comfortably? 30 min    Diagnostic tests xrays showed chronic degenerative changes but otherwise negative    Patient Stated Goals get the pain down    Pain Onset More than a month ago           [facility] Adult PT Treatment/Exercise - 05/06/20 0001      Knee/Hip Exercises: Stretches   Sports administrator Left;3 reps;30 seconds    Quad Stretch Limitations supine with strap    Other Knee/Hip Stretches hip adductor stretch 30 sec X 3 for left side      Knee/Hip Exercises: Aerobic   Recumbent Bike L3 X 10 min      Knee/Hip Exercises: Machines for Strengthening   Cybex Knee Extension 15 lbs 3 sets of 10 with ball sq    Total Gym Leg Press 56 lbs SL 3 X 10 on ea side      Knee/Hip Exercises: Standing   Forward Step Up Left;2 sets;10 reps;Hand Hold: 1;Step Height: 6"   with green band around knee to activate more adductors  Knee/Hip Exercises: Seated   Sit to Sand without UE support;20 reps   with ball sq, 2 sets of 10     Knee/Hip Exercises: Supine   Bridges with Ball Squeeze 20 reps   hold 5 sec                      PT Long Term Goals - 04/17/20 1637      PT LONG TERM GOAL #1   Title Pt will be I and compliant with HEP.    Time 6    Period Weeks    Status New    Target Date 05/29/20      PT LONG TERM GOAL #2   Title Pt will improve FOTO to at least 69%    Time 6    Period Weeks    Status New      PT LONG TERM GOAL #3   Title Pt will improve Lt knee ROM to at least 120 deg to improve funtion.    Baseline 110    Time 6    Period Weeks    Status New      PT LONG TERM GOAL #4   Title Pt will report overall less than 2-3/10 pain with usual work duties and ADLs.    Time 6    Period Weeks     Status New                 Plan - 05/06/20 1550    Clinical Impression Statement Session focused on strength progression with vmo activation and he had good overall tolerance to this. He did have noticeable improvment in knee flexion ROM.  PT will continue to progress as able.    Personal Factors and Comorbidities Comorbidity 2    Comorbidities HTN, Depression, 2020 skin cancer back, obesity    Examination-Activity Limitations Bend;Squat;Stairs;Lift;Stand;Locomotion Level    Examination-Participation Restrictions Community Activity;Occupation;Yard Work    Stability/Clinical Decision Making Stable/Uncomplicated    Rehab Potential Good    PT Frequency 2x / week   1-2   PT Duration 6 weeks    PT Treatment/Interventions ADLs/Self Care Home Management;Aquatic Therapy;Cryotherapy;Electrical Stimulation;Iontophoresis 4mg /ml Dexamethasone;Moist Heat;Ultrasound;Gait training;Therapeutic activities;Therapeutic exercise;Neuromuscular re-education;Manual techniques;Dry needling;Passive range of motion;Taping;Joint Manipulations;Vasopneumatic Device    PT Next Visit Plan review and updat HEP PRN, Lt knee ROM, strength, stability    PT Home Exercise Plan Access Code: [email or web address]    Consulted and Agree with Plan of Care Patient           Patient will benefit from skilled therapeutic intervention in order to improve the following deficits and impairments:  Decreased activity tolerance,Decreased range of motion,Decreased strength,Difficulty walking,Increased edema,Impaired flexibility,Pain  Visit Diagnosis: Acute pain of left knee  Stiffness of left knee, not elsewhere classified  Muscle weakness (generalized)     Problem List Patient Active Problem List   Diagnosis Date Noted  . Family history of diabetes mellitus 10/16/2018  . Cardiomyopathy ([place]) 07/25/2018  . History of atrial fibrillation 11/08/2017  . Routine general medical examination at a health care facility 10/13/2017  .  Essential hypertension 06/03/2013  . Alcohol abuse, in remission 06/03/2013  . Former smoker 06/03/2013    Debbe Odea, PT,DPT 05/06/2020, 3:51 PM       [facility] Physical Therapy [address] [place], [place], [address] Phone: [phone]   Fax:  [phone]  Name: Daniel Romero MRN: [id number] Date of Birth: 02/25/1961

## 2020-05-11 ENCOUNTER — Other Ambulatory Visit: Payer: Self-pay | Admitting: Physician Assistant

## 2020-05-11 ENCOUNTER — Other Ambulatory Visit: Payer: Self-pay | Admitting: Family Medicine

## 2020-05-11 DIAGNOSIS — I428 Other cardiomyopathies: Secondary | ICD-10-CM

## 2020-05-11 DIAGNOSIS — I10 Essential (primary) hypertension: Secondary | ICD-10-CM

## 2020-05-12 ENCOUNTER — Other Ambulatory Visit: Payer: Self-pay

## 2020-05-12 ENCOUNTER — Ambulatory Visit: Payer: 59 | Admitting: Physical Therapy

## 2020-05-12 ENCOUNTER — Encounter: Payer: Self-pay | Admitting: Physical Therapy

## 2020-05-12 DIAGNOSIS — M6281 Muscle weakness (generalized): Secondary | ICD-10-CM | POA: Diagnosis not present

## 2020-05-12 DIAGNOSIS — M25662 Stiffness of left knee, not elsewhere classified: Secondary | ICD-10-CM

## 2020-05-12 DIAGNOSIS — R2689 Other abnormalities of gait and mobility: Secondary | ICD-10-CM | POA: Diagnosis not present

## 2020-05-12 DIAGNOSIS — M25562 Pain in left knee: Secondary | ICD-10-CM | POA: Diagnosis not present

## 2020-05-12 NOTE — Therapy (Signed)
[facility] [address] [place], [place], [address] Phone: [phone]   Fax:  [phone]  Physical Therapy Treatment  Patient Details  Name: Daniel Romero MRN: [id number] Date of Birth: [birth year] Referring Provider (PT): Dr. Jill Alexanders   Encounter Date: 05/12/2020   PT End of Session - 05/12/20 1628    Visit Number 4    Number of Visits 12    Date for PT Re-Evaluation 05/29/20    Authorization Type UHC    PT Start Time 6979    PT Stop Time 1635    PT Time Calculation (min) 38 min    Activity Tolerance Patient tolerated treatment well    Behavior During Therapy [facility] for tasks assessed/performed           Past Medical History:  Diagnosis Date  . Anginal pain ([place])   . Chronic systolic dysfunction of left ventricle   . Depression   . Hypertension   . Obesity   . Persistent atrial fibrillation ([place])   . Seasonal allergies   . Skin cancer 04/2018   BACK    Past Surgical History:  Procedure Laterality Date  . ATRIAL FIBRILLATION ABLATION  05/08/2018  . ATRIAL FIBRILLATION ABLATION N/A 05/08/2018   Procedure: ATRIAL FIBRILLATION ABLATION;  Surgeon: Thompson Grayer, MD;  Location: [place] CV LAB;  Service: Cardiovascular;  Laterality: N/A;  . CARDIOVERSION N/A 12/01/2017   Procedure: CARDIOVERSION;  Surgeon: Larey Dresser, MD;  Location: [facility] ENDOSCOPY;  Service: Cardiovascular;  Laterality: N/A;  . CARDIOVERSION N/A 03/01/2018   Procedure: CARDIOVERSION;  Surgeon: Elouise Munroe, MD;  Location: [facility] ENDOSCOPY;  Service: Cardiovascular;  Laterality: N/A;  . CARDIOVERSION N/A 04/06/2018   Procedure: CARDIOVERSION;  Surgeon: Thayer Headings, MD;  Location: [place];  Service: Cardiovascular;  Laterality: N/A;  . COLONOSCOPY  ~ 2015  . SKIN BIOPSY Left 03/30/2018   squamous cell carcinoma in situ  . WISDOM TOOTH EXTRACTION      There were no vitals filed for this visit.   Subjective Assessment - 05/12/20 1627     Subjective he relays he has not had any pain since last PT session.    Pertinent History HTN, Depression, 2020 skin cancer back, obesity    Limitations Standing;Walking    How long can you stand comfortably? 30 min    How long can you walk comfortably? 30 min    Diagnostic tests xrays showed chronic degenerative changes but otherwise negative    Patient Stated Goals get the pain down    Currently in Pain? No/denies    Pain Onset More than a month ago            [facility] Adult PT Treatment/Exercise - 05/12/20 0001      Knee/Hip Exercises: Stretches   Sports administrator Left;3 reps;30 seconds    Quad Stretch Limitations supine with strap    Hip Flexor Stretch Limitations seated tailgate stretch 10 sec X 10 reps      Knee/Hip Exercises: Aerobic   Recumbent Bike L3 X 10 min      Knee/Hip Exercises: Machines for Strengthening   Cybex Knee Extension 15 lbs 3 sets of 10 with ball sq    Total Gym Leg Press 56 lbs SL 3 X 10 on ea side, 150 lbs bilat 2 sets of 10      Knee/Hip Exercises: Seated   Sit to Sand without UE support;20 reps   with ball sq     Knee/Hip Exercises: Supine  Bridges with [employer] 20 reps                       PT Long Term Goals - 05/12/20 1631      PT LONG TERM GOAL #1   Title Pt will be I and compliant with HEP.    Time 6    Period Weeks    Status On-going      PT LONG TERM GOAL #2   Title Pt will improve FOTO to at least 69%    Time 6    Period Weeks    Status On-going      PT LONG TERM GOAL #3   Title Pt will improve Lt knee ROM to at least 120 deg to improve funtion.    Baseline 110    Time 6    Period Weeks    Status On-going      PT LONG TERM GOAL #4   Title Pt will report overall less than 2-3/10 pain with usual work duties and ADLs.    Time 6    Period Weeks    Status On-going                 Plan - 05/12/20 1629    Clinical Impression Statement He appears to be making progress with pain levels, knee ROM, and  overall knee strength. He had good tolerance to exercises without complaints of pain. PT will continue to work toward his functional goals.    Personal Factors and Comorbidities Comorbidity 2    Comorbidities HTN, Depression, 2020 skin cancer back, obesity    Examination-Activity Limitations Bend;Squat;Stairs;Lift;Stand;Locomotion Level    Examination-Participation Restrictions Community Activity;Occupation;Yard Work    Stability/Clinical Decision Making Stable/Uncomplicated    Rehab Potential Good    PT Frequency 2x / week   1-2   PT Duration 6 weeks    PT Treatment/Interventions ADLs/Self Care Home Management;Aquatic Therapy;Cryotherapy;Electrical Stimulation;Iontophoresis 4mg /ml Dexamethasone;Moist Heat;Ultrasound;Gait training;Therapeutic activities;Therapeutic exercise;Neuromuscular re-education;Manual techniques;Dry needling;Passive range of motion;Taping;Joint Manipulations;Vasopneumatic Device    PT Next Visit Plan review and updat HEP PRN, Lt knee ROM, strength, stability    PT Home Exercise Plan Access Code: [email or web address]    Consulted and Agree with Plan of Care Patient           Patient will benefit from skilled therapeutic intervention in order to improve the following deficits and impairments:  Decreased activity tolerance,Decreased range of motion,Decreased strength,Difficulty walking,Increased edema,Impaired flexibility,Pain  Visit Diagnosis: Acute pain of left knee  Stiffness of left knee, not elsewhere classified  Muscle weakness (generalized)  Other abnormalities of gait and mobility     Problem List Patient Active Problem List   Diagnosis Date Noted  . Family history of diabetes mellitus 10/16/2018  . Cardiomyopathy ([place]) 07/25/2018  . History of atrial fibrillation 11/08/2017  . Routine general medical examination at a health care facility 10/13/2017  . Essential hypertension 06/03/2013  . Alcohol abuse, in remission 06/03/2013  . Former smoker 06/03/2013     Debbe Odea, PT,DPT 05/12/2020, 4:31 PM  [facility] [address] [place], [place], [address] Phone: [phone]   Fax:  [phone]  Name: Daniel Romero MRN: [id number] Date of Birth: 04/12/1960

## 2020-05-19 ENCOUNTER — Other Ambulatory Visit: Payer: Self-pay

## 2020-05-19 ENCOUNTER — Ambulatory Visit: Payer: 59 | Admitting: Physical Therapy

## 2020-05-19 DIAGNOSIS — M25662 Stiffness of left knee, not elsewhere classified: Secondary | ICD-10-CM | POA: Diagnosis not present

## 2020-05-19 DIAGNOSIS — R2689 Other abnormalities of gait and mobility: Secondary | ICD-10-CM

## 2020-05-19 DIAGNOSIS — M25562 Pain in left knee: Secondary | ICD-10-CM | POA: Diagnosis not present

## 2020-05-19 DIAGNOSIS — M6281 Muscle weakness (generalized): Secondary | ICD-10-CM | POA: Diagnosis not present

## 2020-05-19 NOTE — Therapy (Addendum)
[facility] [address] [place], [place], [address] Phone: [phone]   Fax:  [phone]  Physical Therapy Treatment/Discharge addendum PHYSICAL THERAPY DISCHARGE SUMMARY  Visits from Start of Care: 5  Current functional level related to goals / functional outcomes: See below   Remaining deficits: See below   Education / Equipment: HEP Plan:                                                    Patient goals were not met. Patient is being discharged due to not returning since the last visit.  ?????   Elsie Ra, PT, DPT 07/06/20 8:41 AM     Patient Details  Name: Daniel Romero MRN: [id number] Date of Birth: 10-Aug-1960 Referring Provider (PT): Dr. Jill Alexanders   Encounter Date: 05/19/2020   PT End of Session - 05/19/20 1645    Visit Number 5    Number of Visits 12    Date for PT Re-Evaluation 05/29/20    Authorization Type UHC    PT Start Time 1604    PT Stop Time 1645    PT Time Calculation (min) 41 min    Activity Tolerance Patient tolerated treatment well    Behavior During Therapy [facility] for tasks assessed/performed           Past Medical History:  Diagnosis Date  . Anginal pain ([place])   . Chronic systolic dysfunction of left ventricle   . Depression   . Hypertension   . Obesity   . Persistent atrial fibrillation ([place])   . Seasonal allergies   . Skin cancer 04/2018   BACK    Past Surgical History:  Procedure Laterality Date  . ATRIAL FIBRILLATION ABLATION  05/08/2018  . ATRIAL FIBRILLATION ABLATION N/A 05/08/2018   Procedure: ATRIAL FIBRILLATION ABLATION;  Surgeon: Thompson Grayer, MD;  Location: [place] CV LAB;  Service: Cardiovascular;  Laterality: N/A;  . CARDIOVERSION N/A 12/01/2017   Procedure: CARDIOVERSION;  Surgeon: Larey Dresser, MD;  Location: [facility] ENDOSCOPY;  Service: Cardiovascular;  Laterality: N/A;  . CARDIOVERSION N/A 03/01/2018   Procedure: CARDIOVERSION;  Surgeon: Elouise Munroe, MD;   Location: [facility] ENDOSCOPY;  Service: Cardiovascular;  Laterality: N/A;  . CARDIOVERSION N/A 04/06/2018   Procedure: CARDIOVERSION;  Surgeon: Thayer Headings, MD;  Location: [place];  Service: Cardiovascular;  Laterality: N/A;  . COLONOSCOPY  ~ 2015  . SKIN BIOPSY Left 03/30/2018   squamous cell carcinoma in situ  . WISDOM TOOTH EXTRACTION      There were no vitals filed for this visit.   Subjective Assessment - 05/19/20 1606    Subjective He has been doing well, reports no pain in knee since last visit and things have been going well.    Pertinent History HTN, Depression, 2020 skin cancer back, obesity    Limitations Standing;Walking    How long can you stand comfortably? 30 min    How long can you walk comfortably? 30 min    Diagnostic tests xrays showed chronic degenerative changes but otherwise negative    Patient Stated Goals get the pain down    Currently in Pain? No/denies    Pain Orientation Left    Pain Descriptors / Indicators Burning;Stabbing    Pain Type Chronic pain    Pain Radiating Towards n/a    Pain Onset More  than a month ago              [facility] Adult PT Treatment/Exercise - 05/19/20 0001      Knee/Hip Exercises: Stretches   Hip Flexor Stretch Limitations seated tailgate stretch 10 sec X 10 reps      Knee/Hip Exercises: Aerobic   Recumbent Bike L3 x 8 min      Knee/Hip Exercises: Machines for Strengthening   Cybex Knee Extension 15 lbs 3 sets of 10    Total Gym Leg Press 56 lbs SL 3 X 10 on ea side, 150 lbs bilat 2 sets of 10      Knee/Hip Exercises: Seated   Other Seated Knee/Hip Exercises ball between ankles green tband aroun knees 5 sec hold x10, ball between knees, green tband around ankles 5 sec hold 10x    Hamstring Curl Strengthening;Left;1 set;15 reps    Hamstring Limitations green tband    Sit to Sand without UE support;10 reps   normal chair height     Knee/Hip Exercises: Supine   Heel Slides AAROM;Left;1 set;10 reps   hold 5 sec, ending  measurement 127 AAROM, AROM 119   Bridges 10 reps    Bridges with [employer] 10 reps                       PT Long Term Goals - 05/12/20 1631      PT LONG TERM GOAL #1   Title Pt will be I and compliant with HEP.    Time 6    Period Weeks    Status On-going      PT LONG TERM GOAL #2   Title Pt will improve FOTO to at least 69%    Time 6    Period Weeks    Status On-going      PT LONG TERM GOAL #3   Title Pt will improve Lt knee ROM to at least 120 deg to improve funtion.    Baseline 110    Time 6    Period Weeks    Status On-going      PT LONG TERM GOAL #4   Title Pt will report overall less than 2-3/10 pain with usual work duties and ADLs.    Time 6    Period Weeks    Status On-going                 Plan - 05/19/20 1641    Clinical Impression Statement Patient seems to tolerate all strenghtening activities well, no concerns of pain. He is progressing in his LE strength to meet his goals. AROM is coming along with his continued stretching.    Personal Factors and Comorbidities Comorbidity 2    Comorbidities HTN, Depression, 2020 skin cancer back, obesity    Examination-Activity Limitations Bend;Squat;Stairs;Lift;Stand;Locomotion Level    Examination-Participation Restrictions Community Activity;Occupation;Yard Work    Stability/Clinical Decision Making Stable/Uncomplicated    Rehab Potential Good    PT Frequency 2x / week   1-2   PT Duration 6 weeks    PT Treatment/Interventions ADLs/Self Care Home Management;Aquatic Therapy;Cryotherapy;Electrical Stimulation;Iontophoresis 62m/ml Dexamethasone;Moist Heat;Ultrasound;Gait training;Therapeutic activities;Therapeutic exercise;Neuromuscular re-education;Manual techniques;Dry needling;Passive range of motion;Taping;Joint Manipulations;Vasopneumatic Device    PT Next Visit Plan review and update HEP PRN,emphasize AROM of knee flexion for increased function, LE strength    PT Home Exercise Plan Access  Code: [email or web address]    Consulted and Agree with Plan of Care Patient  Patient will benefit from skilled therapeutic intervention in order to improve the following deficits and impairments:  Decreased activity tolerance,Decreased range of motion,Decreased strength,Difficulty walking,Increased edema,Impaired flexibility,Pain  Visit Diagnosis: Acute pain of left knee  Stiffness of left knee, not elsewhere classified  Muscle weakness (generalized)  Other abnormalities of gait and mobility     Problem List Patient Active Problem List   Diagnosis Date Noted  . Family history of diabetes mellitus 10/16/2018  . Cardiomyopathy ([place]) 07/25/2018  . History of atrial fibrillation 11/08/2017  . Routine general medical examination at a health care facility 10/13/2017  . Essential hypertension 06/03/2013  . Alcohol abuse, in remission 06/03/2013  . Former smoker 06/03/2013    Glenetta Hew, SPT 05/19/2020, 4:46 PM   During this treatment session, this physical therapist was present, participating in and directing the treatment.   This note has been reviewed and this clinician agrees with the information provided.  Elsie Ra, PT, DPT 05/20/20 8:29 AM   [facility] [address] [place], [place], [address] Phone: [phone]   Fax:  [phone]  Name: Daniel Romero MRN: [id number] Date of Birth: 1960/07/06

## 2020-05-25 ENCOUNTER — Encounter: Payer: 59 | Admitting: Physical Therapy

## 2020-05-29 ENCOUNTER — Telehealth: Payer: Self-pay | Admitting: Cardiovascular Disease

## 2020-05-29 DIAGNOSIS — I10 Essential (primary) hypertension: Secondary | ICD-10-CM

## 2020-05-29 DIAGNOSIS — I428 Other cardiomyopathies: Secondary | ICD-10-CM

## 2020-05-29 MED ORDER — CARVEDILOL 6.25 MG PO TABS: ORAL_TABLET | ORAL | 0 refills | Status: DC

## 2020-05-29 NOTE — Telephone Encounter (Signed)
Patient would like a refill on his Carvedilol.  He has an appointment with Dr. Ellyn Hack on May 11th.

## 2020-06-10 ENCOUNTER — Other Ambulatory Visit: Payer: Self-pay

## 2020-06-10 ENCOUNTER — Ambulatory Visit (INDEPENDENT_AMBULATORY_CARE_PROVIDER_SITE_OTHER): Payer: 59 | Admitting: Psychiatry

## 2020-06-10 ENCOUNTER — Encounter: Payer: Self-pay | Admitting: Psychiatry

## 2020-06-10 DIAGNOSIS — F4323 Adjustment disorder with mixed anxiety and depressed mood: Secondary | ICD-10-CM

## 2020-06-10 NOTE — Progress Notes (Signed)
      [facility] Counselor/Therapist Progress Note  Patient ID: Daniel Romero, MRN: [id number],    Date: 06/10/2020  Time Spent: 50 minutes   Treatment Type: Individual Therapy  Reported Symptoms: anxiety, sadness  Mental Status Exam:  Appearance:   Casual and Well Groomed     Behavior:  Appropriate  Motor:  Normal  Speech/Language:   Clear and Coherent  Affect:  Appropriate  Mood:  anxious and sad  Thought process:  normal  Thought content:    WNL  Sensory/Perceptual disturbances:    WNL  Orientation:  oriented to person, place, time/date and situation  Attention:  Good  Concentration:  Good  Memory:  WNL  Fund of knowledge:   Good  Insight:    Good  Judgment:   Good  Impulse Control:  Good   Risk Assessment: Danger to Self:  No Self-injurious Behavior: No Danger to Others: No Duty to Warn:no Physical Aggression / Violence:No  Access to Firearms a concern: No  Gang Involvement:No   Subjective: The client states that his girlfriend in [place] is pressuring him for a commitment.  The client knows that he does not want to be in the relationship but has always taken a passive stance letting things unfold as they will until they fall apart.  I discussed with the client how this intersects with the last days the client had with his father.  Today we started with eye-movement focusing on the client's time with his dad before he died.  The client had decided to go ahead and leave that night because there were nurses there to take care of things.  When he left, his dad tried to get up. He fell and died soon after.  The client was not present.  As we processed this the client realized that he had been raised to be afraid of doing anything.  His father always directed and controlled everything.  Even in his last days the client realized his dad died on his terms.  "The way I am is the way I was raised."  As the client realizes this he becomes very, very tearful.  We switched  the bilateral stimulation hand paddles.  The client visualized his dad in the hospital room.  His dad told him it was okay.  The client felt relief.  I discussed with the client if he was raised to believe to be afraid of everything or to take a passive role is it time to change?  The client stated that it was.  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  His subjective units of distress went from a 7+ to less and 2.  His positive cognition was, "I can do something else."  Interventions: Motivational Interviewing, Solution-Oriented/Positive Psychology, [employer] Desensitization and Reprocessing (EMDR) and Insight-Oriented  Diagnosis:   ICD-10-CM   1. Adjustment disorder with mixed anxiety and depressed mood  F43.23     Plan: Mood independent behavior, positive self talk, self-care, radical acceptance, actively make different choices.  Lashayla Armes, [facility]

## 2020-07-06 DIAGNOSIS — I10 Essential (primary) hypertension: Secondary | ICD-10-CM

## 2020-07-06 DIAGNOSIS — I428 Other cardiomyopathies: Secondary | ICD-10-CM

## 2020-07-09 ENCOUNTER — Ambulatory Visit: Payer: 59 | Admitting: Psychiatry

## 2020-07-29 ENCOUNTER — Ambulatory Visit: Payer: 59 | Admitting: Cardiovascular Disease

## 2020-08-03 MED ORDER — CARVEDILOL 6.25 MG PO TABS: ORAL_TABLET | ORAL | 0 refills | Status: DC

## 2020-08-03 NOTE — Addendum Note (Signed)
Addended by: Cristopher Estimable on: 08/03/2020 08:12 AM   Modules accepted: Orders

## 2020-08-06 ENCOUNTER — Ambulatory Visit: Payer: 59 | Admitting: Psychiatry

## 2020-09-24 ENCOUNTER — Other Ambulatory Visit: Payer: Self-pay | Admitting: *Deleted

## 2020-09-24 DIAGNOSIS — I10 Essential (primary) hypertension: Secondary | ICD-10-CM

## 2020-09-24 DIAGNOSIS — I428 Other cardiomyopathies: Secondary | ICD-10-CM

## 2020-09-24 MED ORDER — CARVEDILOL 6.25 MG PO TABS: ORAL_TABLET | ORAL | 0 refills | Status: DC

## 2020-10-13 ENCOUNTER — Other Ambulatory Visit: Payer: Self-pay

## 2020-10-13 ENCOUNTER — Encounter: Payer: Self-pay | Admitting: Cardiovascular Disease

## 2020-10-13 ENCOUNTER — Ambulatory Visit (INDEPENDENT_AMBULATORY_CARE_PROVIDER_SITE_OTHER): Payer: BC Managed Care – PPO | Admitting: Cardiovascular Disease

## 2020-10-13 VITALS — BP 118/72 | HR 91 | Ht 69.5 in | Wt 241.0 lb

## 2020-10-13 DIAGNOSIS — Z8679 Personal history of other diseases of the circulatory system: Secondary | ICD-10-CM | POA: Diagnosis not present

## 2020-10-13 DIAGNOSIS — I428 Other cardiomyopathies: Secondary | ICD-10-CM | POA: Diagnosis not present

## 2020-10-13 DIAGNOSIS — E782 Mixed hyperlipidemia: Secondary | ICD-10-CM

## 2020-10-13 DIAGNOSIS — I10 Essential (primary) hypertension: Secondary | ICD-10-CM

## 2020-10-13 NOTE — Assessment & Plan Note (Signed)
History of cardiomyopathy probably A. fib/tachycardia mediated with an EF of 40 to 45% in 2019.  His most recent echo performed after his ablation 07/30/2018 revealed normal LV systolic function.

## 2020-10-13 NOTE — Assessment & Plan Note (Signed)
History of atrial fibrillation status post A. fib ablation by Dr. Rayann Heman 05/08/2018.  He is maintaining sinus rhythm.

## 2020-10-13 NOTE — Patient Instructions (Signed)
  Lab Work:  Your physician recommends that you return for lab work [employer] OF NOVEMBER  If you have labs (blood work) drawn today and your tests are completely normal, you will receive your results only by: [employer] (if you have [place]) OR A paper copy in the mail If you have any lab test that is abnormal or we need to change your treatment, we will call you to review the results.  Follow-Up: At [facility], you and your health needs are our priority.  As part of our continuing mission to provide you with exceptional heart care, we have created designated Provider Care Teams.  These Care Teams include your primary Cardiologist (physician) and Advanced Practice Providers (APPs -  Physician Assistants and Nurse Practitioners) who all work together to provide you with the care you need, when you need it.  We recommend signing up for the patient portal called "MyChart".  Sign up information is provided on this After Visit Summary.  MyChart is used to connect with patients for Virtual Visits (Telemedicine).  Patients are able to view lab/test results, encounter notes, upcoming appointments, etc.  Non-urgent messages can be sent to your provider as well.   To learn more about what you can do with MyChart, go to [email or web address].    Your next appointment:   12 month(s)  The format for your next appointment:   In Person  Provider:   Quay Burow, MD

## 2020-10-13 NOTE — Progress Notes (Signed)
10/13/2020 Daniel Romero   1960-07-08  RC:[id number]  Primary Physician Denita Lung, MD Primary Cardiologist: Lorretta Harp MD Lupe Carney, [place]  HPI:  Daniel Romero is a 60 y.o.    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  He was referred by Dr. Redmond School for cardiovascular evaluation because of newly recognized A. fib.    I last saw him in the office 12/25/2018.  He does have a history of treated hypertension.  He smoked remotely and drank remotely as well stopped 7 years ago.  He is never had a heart attack or stroke.  There is no family history.  He is not aware that he is in A. fib.  He was begun on Xarelto.   I arranged for him to undergo outpatient cardioversion performed by Dr. Aundra Dubin successfully 12/01/2017.  He really cannot tell a difference although I suspect he is back in A. fib today.  He did have a 2D echocardiogram performed 11/07/2017 revealing an EF of 40 to 45% with mild left atrial dilatation.  This most recent 2D echo performed 07/25/2018 revealed normal LV systolic function.   Ultimately referred him to Dr. Rayann Heman for evaluation of his A. fib.  He failed amiodarone and Tikosyn and ultimately had atrial fibrillation ablation by Dr. Rayann Heman 05/08/2018.  He is maintaining sinus rhythm and no longer is on oral anticoagulation.   He continues to follow-up with Roderic Palau, NP in our A. fib clinic who recently saw him on 06/04/2019.  He is maintaining sinus rhythm and feels clinically improved.  Follow-up 2D echocardiogram performed 07/30/2018 revealed improvement in his ejection fraction up to 55 to 60% compared to his prior echo in 2019 which revealed an EF of 40 to 45% probably related to his A. fib.  Since I saw him slightly over a year ago he continues to do well.  He denies chest pain or shortness of breath.  He does  admit to dietary indiscretion and had a lipid profile performed 10/17/2019 revealing total cholesterol 211, LDL of 133 and HDL of 55.   Current Meds  Medication Sig   carvedilol (COREG) 6.25 MG tablet TAKE 1 TABLET BY MOUTH TWICE DAILY WITH A MEAL.   lisinopril-hydrochlorothiazide (ZESTORETIC) 10-12.5 MG tablet TAKE 1 TABLET BY MOUTH DAILY     No Known Allergies  Social History   Socioeconomic History   Marital status: Single    Spouse name: Not on file   Number of children: Not on file   Years of education: Not on file   Highest education level: Not on file  Occupational History   Not on file  Tobacco Use   Smoking status: Former    Packs/day: 0.75    Years: 5.00    Pack years: 3.75    Types: Cigarettes    Quit date: 05/20/2013    Years since quitting: 7.4   Smokeless tobacco: Never  Vaping Use   Vaping Use: Never used  Substance and Sexual Activity   Alcohol use: Not Currently   Drug use: Not Currently   Sexual activity: Yes  Other Topics Concern   Not on file  Social History Narrative   Not on file   Social Determinants of Health   Financial Resource Strain: Not on file  Food Insecurity: Not on file  Transportation Needs: Not on  file  Physical Activity: Not on file  Stress: Not on file  Social Connections: Not on file  Intimate Partner Violence: Not on file     Review of Systems: General: negative for chills, fever, night sweats or weight changes.  Cardiovascular: negative for chest pain, dyspnea on exertion, edema, orthopnea, palpitations, paroxysmal nocturnal dyspnea or shortness of breath Dermatological: negative for rash Respiratory: negative for cough or wheezing Urologic: negative for hematuria Abdominal: negative for nausea, vomiting, diarrhea, bright red blood per rectum, melena, or hematemesis Neurologic: negative for visual changes, syncope, or dizziness All other systems reviewed and are otherwise negative except as noted above.    Blood  pressure 118/72, pulse 91, height 5' 9.5" (1.765 m), weight 241 lb (109.3 kg).  General appearance: alert and no distress Neck: no adenopathy, no carotid bruit, no JVD, supple, symmetrical, trachea midline, and thyroid not enlarged, symmetric, no tenderness/mass/nodules Lungs: clear to auscultation bilaterally Heart: regular rate and rhythm, S1, S2 normal, no murmur, click, rub or gallop Extremities: extremities normal, atraumatic, no cyanosis or edema Pulses: 2+ and symmetric Skin: Skin color, texture, turgor normal. No rashes or lesions Neurologic: Grossly normal  EKG sinus rhythm at 91 with marked sinus arrhythmia and occasional PACs.  I personally reviewed this EKG.  ASSESSMENT AND PLAN:   Essential hypertension History of essential hypertension blood pressure measured today at 118/72.  He is on lisinopril, hydrochlorothiazide and carvedilol.  History of atrial fibrillation History of atrial fibrillation status post A. fib ablation by Dr. Rayann Heman 05/08/2018.  He is maintaining sinus rhythm.  Cardiomyopathy ([place]) History of cardiomyopathy probably A. fib/tachycardia mediated with an EF of 40 to 45% in 2019.  His most recent echo performed after his ablation 07/30/2018 revealed normal LV systolic function.     Lorretta Harp MD FACP,FACC,FAHA, [facility] 10/13/2020 3:12 PM

## 2020-10-13 NOTE — Assessment & Plan Note (Signed)
History of essential hypertension blood pressure measured today at 118/72.  He is on lisinopril, hydrochlorothiazide and carvedilol.

## 2020-10-19 ENCOUNTER — Ambulatory Visit (INDEPENDENT_AMBULATORY_CARE_PROVIDER_SITE_OTHER): Payer: BC Managed Care – PPO | Admitting: Family Medicine

## 2020-10-19 ENCOUNTER — Other Ambulatory Visit: Payer: Self-pay

## 2020-10-19 ENCOUNTER — Encounter: Payer: Self-pay | Admitting: Family Medicine

## 2020-10-19 VITALS — BP 102/66 | HR 65 | Temp 98.3°F | Ht 69.75 in | Wt 238.8 lb

## 2020-10-19 DIAGNOSIS — Z23 Encounter for immunization: Secondary | ICD-10-CM

## 2020-10-19 DIAGNOSIS — F1011 Alcohol abuse, in remission: Secondary | ICD-10-CM

## 2020-10-19 DIAGNOSIS — I10 Essential (primary) hypertension: Secondary | ICD-10-CM | POA: Diagnosis not present

## 2020-10-19 DIAGNOSIS — L709 Acne, unspecified: Secondary | ICD-10-CM

## 2020-10-19 DIAGNOSIS — Z8616 Personal history of COVID-19: Secondary | ICD-10-CM | POA: Insufficient documentation

## 2020-10-19 DIAGNOSIS — Z833 Family history of diabetes mellitus: Secondary | ICD-10-CM

## 2020-10-19 DIAGNOSIS — Z87891 Personal history of nicotine dependence: Secondary | ICD-10-CM

## 2020-10-19 DIAGNOSIS — I428 Other cardiomyopathies: Secondary | ICD-10-CM | POA: Diagnosis not present

## 2020-10-19 DIAGNOSIS — Z0000 Encounter for general adult medical examination without abnormal findings: Secondary | ICD-10-CM

## 2020-10-19 DIAGNOSIS — Z8679 Personal history of other diseases of the circulatory system: Secondary | ICD-10-CM

## 2020-10-19 MED ORDER — LISINOPRIL-HYDROCHLOROTHIAZIDE 10-12.5 MG PO TABS: 1.0000 | ORAL_TABLET | Freq: Every day | ORAL | 3 refills | Status: DC

## 2020-10-19 MED ORDER — CARVEDILOL 6.25 MG PO TABS: ORAL_TABLET | ORAL | 3 refills | Status: DC

## 2020-10-19 NOTE — Progress Notes (Signed)
   Subjective:    Patient ID: Daniel Romero, male    DOB: 1960/09/16, 60 y.o.   MRN: [id number]  HPI He is here for complete examination.  He does complain of acne that he has had intermittently for on his face for the last year.  He would like further help with this.  He also has a previous history of atrial fib with an ablation.  He seems be doing quite nicely on Coreg as well as lisinopril/HCTZ.  He also has a history of cardiomyopathy and does see cardiology periodically.  He is remained alcohol free for approximately 10 years.  He did at one time go to [place] but does not feel the need.  He is a former smoker.  There is a family history of diabetes.  He also has a history of COVID and has had his vaccinations.  He is single but dating a woman and that seems to be going well.  Work is going well.  Family and social history as well as health maintenance immunizations was reviewed   Review of Systems  All other systems reviewed and are negative.     Objective:   Physical Exam Alert and in no distress.  Exam of his face does show slight erythema and acne type lesions present on the cheeks.  Tympanic membranes and canals are normal. Pharyngeal area is normal. Neck is supple without adenopathy or thyromegaly. Cardiac exam shows a regular sinus rhythm without murmurs or gallops. Lungs are clear to auscultation.        Assessment & Plan:  Routine general medical examination at a health care facility - Plan: CBC with Differential/Platelet, Comprehensive metabolic panel, Lipid panel  Essential hypertension - Plan: CBC with Differential/Platelet, Comprehensive metabolic panel, carvedilol (COREG) 6.25 MG tablet, lisinopril-hydrochlorothiazide (ZESTORETIC) 10-12.5 MG tablet  Alcohol abuse, in remission - Last alcohol 2012  Former smoker  History of atrial fibrillation  Other cardiomyopathy ([place]) - Plan: CBC with Differential/Platelet, Comprehensive metabolic panel, Lipid panel, carvedilol  (COREG) 6.25 MG tablet  Family history of diabetes mellitus  Adult acne - Plan: Ambulatory referral to Dermatology  Need for shingles vaccine - Plan: Varicella-zoster vaccine IM  History of COVID-19 In general he is doing quite well.  I will continue him on his present medication regimen and refer to dermatology.  Discussed going on Medicare and the need for pneumococcal vaccine, AAA evaluation etc.

## 2020-10-20 LAB — CBC WITH DIFFERENTIAL/PLATELET
Basophils Absolute: 0.1 10*3/uL (ref 0.0–0.2)
Basos: 1 %
EOS (ABSOLUTE): 0.3 10*3/uL (ref 0.0–0.4)
Eos: 3 %
Hematocrit: 47 % (ref 37.5–51.0)
Hemoglobin: 15.9 g/dL (ref 13.0–17.7)
Immature Grans (Abs): 0.1 10*3/uL (ref 0.0–0.1)
Immature Granulocytes: 1 %
Lymphocytes Absolute: 2 10*3/uL (ref 0.7–3.1)
Lymphs: 22 %
MCH: 29.8 pg (ref 26.6–33.0)
MCHC: 33.8 g/dL (ref 31.5–35.7)
MCV: 88 fL (ref 79–97)
Monocytes Absolute: 0.7 10*3/uL (ref 0.1–0.9)
Monocytes: 7 %
Neutrophils Absolute: 6.2 10*3/uL (ref 1.4–7.0)
Neutrophils: 66 %
Platelets: 344 10*3/uL (ref 150–450)
RBC: 5.33 x10E6/uL (ref 4.14–5.80)
RDW: 13 % (ref 11.6–15.4)
WBC: 9.3 10*3/uL (ref 3.4–10.8)

## 2020-10-20 LAB — COMPREHENSIVE METABOLIC PANEL
ALT: 44 IU/L (ref 0–44)
AST: 30 IU/L (ref 0–40)
Albumin/Globulin Ratio: 2.3 — ABNORMAL HIGH (ref 1.2–2.2)
Albumin: 4.9 g/dL (ref 3.8–4.9)
Alkaline Phosphatase: 67 IU/L (ref 44–121)
BUN/Creatinine Ratio: 11 (ref 9–20)
BUN: 9 mg/dL (ref 6–24)
Bilirubin Total: 0.5 mg/dL (ref 0.0–1.2)
CO2: 25 mmol/L (ref 20–29)
Calcium: 9.5 mg/dL (ref 8.7–10.2)
Chloride: 100 mmol/L (ref 96–106)
Creatinine, Ser: 0.85 mg/dL (ref 0.76–1.27)
Globulin, Total: 2.1 g/dL (ref 1.5–4.5)
Glucose: 89 mg/dL (ref 65–99)
Potassium: 4.1 mmol/L (ref 3.5–5.2)
Sodium: 140 mmol/L (ref 134–144)
Total Protein: 7 g/dL (ref 6.0–8.5)
eGFR: 101 mL/min/{1.73_m2} (ref >59)

## 2020-10-20 LAB — LIPID PANEL
Chol/HDL Ratio: 4.3 ratio (ref 0.0–5.0)
Cholesterol, Total: 212 mg/dL — ABNORMAL HIGH (ref 100–199)
HDL: 49 mg/dL (ref >39)
LDL Chol Calc (NIH): 139 mg/dL — ABNORMAL HIGH (ref 0–99)
Triglycerides: 135 mg/dL (ref 0–149)
VLDL Cholesterol Cal: 24 mg/dL (ref 5–40)

## 2020-10-21 DIAGNOSIS — L718 Other rosacea: Secondary | ICD-10-CM | POA: Diagnosis not present

## 2020-10-21 DIAGNOSIS — L821 Other seborrheic keratosis: Secondary | ICD-10-CM | POA: Diagnosis not present

## 2020-10-21 DIAGNOSIS — D1801 Hemangioma of skin and subcutaneous tissue: Secondary | ICD-10-CM | POA: Diagnosis not present

## 2020-11-18 ENCOUNTER — Telehealth: Payer: Self-pay | Admitting: Family Medicine

## 2020-11-18 ENCOUNTER — Other Ambulatory Visit: Payer: Self-pay | Admitting: Cardiovascular Disease

## 2020-11-18 DIAGNOSIS — I428 Other cardiomyopathies: Secondary | ICD-10-CM

## 2020-11-18 DIAGNOSIS — I10 Essential (primary) hypertension: Secondary | ICD-10-CM

## 2020-11-18 NOTE — Telephone Encounter (Signed)
Pt called and the coreg rx was not sent in to the pharmacy Is was printed

## 2020-11-18 NOTE — Telephone Encounter (Signed)
IF THE RX COULD be resent to the [facility], [address] AT [facility]

## 2020-11-24 ENCOUNTER — Encounter: Payer: Self-pay | Admitting: Family Medicine

## 2020-11-25 ENCOUNTER — Other Ambulatory Visit: Payer: Self-pay

## 2020-11-25 DIAGNOSIS — I10 Essential (primary) hypertension: Secondary | ICD-10-CM

## 2020-11-25 DIAGNOSIS — I428 Other cardiomyopathies: Secondary | ICD-10-CM

## 2020-11-25 MED ORDER — CARVEDILOL 6.25 MG PO TABS: ORAL_TABLET | ORAL | 1 refills | Status: DC

## 2020-11-25 NOTE — Telephone Encounter (Signed)
Done Kh 

## 2020-11-26 ENCOUNTER — Other Ambulatory Visit: Payer: Self-pay

## 2020-11-26 ENCOUNTER — Other Ambulatory Visit: Payer: BC Managed Care – PPO

## 2020-12-01 ENCOUNTER — Telehealth: Payer: Self-pay | Admitting: Family Medicine

## 2020-12-01 ENCOUNTER — Other Ambulatory Visit: Payer: Self-pay

## 2020-12-01 DIAGNOSIS — I10 Essential (primary) hypertension: Secondary | ICD-10-CM

## 2020-12-01 DIAGNOSIS — I428 Other cardiomyopathies: Secondary | ICD-10-CM

## 2020-12-01 MED ORDER — CARVEDILOL 6.25 MG PO TABS: ORAL_TABLET | ORAL | 2 refills | Status: DC

## 2020-12-01 NOTE — Telephone Encounter (Signed)
Patient left message about carvedilol rx [facility] pharmacy would not fill, he does not know why  [employer], went sent in it was too early, he could not fill before 9/11 but now past that time and also insurance only allows 30 days supply   Pt informed

## 2020-12-22 ENCOUNTER — Other Ambulatory Visit: Payer: BC Managed Care – PPO

## 2020-12-22 DIAGNOSIS — L718 Other rosacea: Secondary | ICD-10-CM | POA: Diagnosis not present

## 2020-12-29 ENCOUNTER — Other Ambulatory Visit (INDEPENDENT_AMBULATORY_CARE_PROVIDER_SITE_OTHER): Payer: BC Managed Care – PPO

## 2020-12-29 ENCOUNTER — Other Ambulatory Visit: Payer: Self-pay

## 2020-12-29 DIAGNOSIS — Z23 Encounter for immunization: Secondary | ICD-10-CM | POA: Diagnosis not present

## 2021-03-26 ENCOUNTER — Ambulatory Visit (INDEPENDENT_AMBULATORY_CARE_PROVIDER_SITE_OTHER): Payer: BC Managed Care – PPO | Admitting: Internal Medicine

## 2021-03-26 ENCOUNTER — Encounter: Payer: Self-pay | Admitting: Internal Medicine

## 2021-03-26 VITALS — BP 146/85 | HR 65 | Ht 70.0 in | Wt 244.0 lb

## 2021-03-26 DIAGNOSIS — I480 Paroxysmal atrial fibrillation: Secondary | ICD-10-CM | POA: Diagnosis not present

## 2021-03-26 DIAGNOSIS — R6889 Other general symptoms and signs: Secondary | ICD-10-CM

## 2021-03-26 DIAGNOSIS — I10 Essential (primary) hypertension: Secondary | ICD-10-CM

## 2021-03-26 NOTE — Patient Instructions (Signed)
Medication Instructions:  Continue all current medications.  Labwork: TSH, free T4, CBC, BMET - orders given today Office will contact with results via phone or letter.    Testing/Procedures: none  Follow-Up: 1 year - afib clinic   Any Other Special Instructions Will Be Listed Below (If Applicable).   If you need a refill on your cardiac medications before your next appointment, please call your pharmacy.

## 2021-03-26 NOTE — Progress Notes (Signed)
° °  PCP: Denita Lung, MD Primary Cardiologist: Dr Gwenlyn Found Primary EP: Dr Massie Bougie Daniel Romero is a 61 y.o. male who presents today for routine electrophysiology followup.  Since last being seen in our clinic, the patient reports doing very well.  Today, he denies symptoms of palpitations, chest pain, shortness of breath,  lower extremity edema, dizziness, presyncope, or syncope.  The patient is otherwise without complaint today.   Past Medical History:  Diagnosis Date   Anginal pain ([place])    Chronic systolic dysfunction of left ventricle    Depression    Hypertension    Obesity    Persistent atrial fibrillation (HCC)    Seasonal allergies    Skin cancer 04/2018   BACK   Past Surgical History:  Procedure Laterality Date   ATRIAL FIBRILLATION ABLATION  05/08/2018   ATRIAL FIBRILLATION ABLATION N/A 05/08/2018   Procedure: ATRIAL FIBRILLATION ABLATION;  Surgeon: Thompson Grayer, MD;  Location: [facility];  Service: Cardiovascular;  Laterality: N/A;   CARDIOVERSION N/A 12/01/2017   Procedure: CARDIOVERSION;  Surgeon: Larey Dresser, MD;  Location: [facility] ENDOSCOPY;  Service: Cardiovascular;  Laterality: N/A;   CARDIOVERSION N/A 03/01/2018   Procedure: CARDIOVERSION;  Surgeon: Elouise Munroe, MD;  Location: [place];  Service: Cardiovascular;  Laterality: N/A;   CARDIOVERSION N/A 04/06/2018   Procedure: CARDIOVERSION;  Surgeon: Thayer Headings, MD;  Location: [place];  Service: Cardiovascular;  Laterality: N/A;   COLONOSCOPY  ~ 2015   SKIN BIOPSY Left 03/30/2018   squamous cell carcinoma in situ   WISDOM TOOTH EXTRACTION      ROS- all systems are reviewed and negatives except as per HPI above  Current Outpatient Medications  Medication Sig Dispense Refill   carvedilol (COREG) 6.25 MG tablet TAKE 1 TABLET BY MOUTH TWICE DAILY WITH A MEAL. 60 tablet 2   lisinopril-hydrochlorothiazide (ZESTORETIC) 10-12.5 MG tablet Take 1 tablet by mouth daily. 90 tablet 3    No current facility-administered medications for this visit.    Physical Exam: Vitals:   03/26/21 1543  BP: (!) 146/85  Pulse: 65  Weight: 244 lb (110.7 kg)  Height: 5\' 10"  (1.778 m)    GEN- The patient is well appearing, alert and oriented x 3 today.   Head- normocephalic, atraumatic Eyes-  Sclera clear, conjunctiva pink Ears- hearing intact Oropharynx- clear Lungs- Clear to ausculation bilaterally, normal work of breathing Heart- Regular rate and rhythm, no murmurs, rubs or gallops, PMI not laterally displaced GI- soft, NT, ND, + BS Extremities- no clubbing, cyanosis, or edema  Wt Readings from Last 3 Encounters:  03/26/21 244 lb (110.7 kg)  10/19/20 238 lb 12.8 oz (108.3 kg)  10/13/20 241 lb (109.3 kg)   Assessment and Plan:  Persistent afib Well controlled post ablation off AAD therapy Chads2vasc score is 1.  Does not require [place] per guidelines Cbc and bmet  2. HTN Stable No change required today bmet  3. Tachycardia mediated CM Resolved with sinus  4. Overweight Body mass index is 35.01 kg/m. Lifestyle modification is advised  5. Cool intolerance He is frequently cold His significant other is concerned that he wears a coat frequently I will obtain cbc and tfts to evaluate for metabolic causes  Follow-up in AF clinic in a year  Thompson Grayer MD, [facility] 03/26/2021 3:48 PM

## 2021-06-04 ENCOUNTER — Other Ambulatory Visit: Payer: Self-pay | Admitting: Family Medicine

## 2021-06-04 DIAGNOSIS — I10 Essential (primary) hypertension: Secondary | ICD-10-CM

## 2021-06-04 DIAGNOSIS — I428 Other cardiomyopathies: Secondary | ICD-10-CM

## 2021-09-14 ENCOUNTER — Other Ambulatory Visit: Payer: Self-pay | Admitting: Family Medicine

## 2021-09-14 DIAGNOSIS — I10 Essential (primary) hypertension: Secondary | ICD-10-CM

## 2021-09-14 DIAGNOSIS — I428 Other cardiomyopathies: Secondary | ICD-10-CM

## 2021-10-20 ENCOUNTER — Other Ambulatory Visit: Payer: Self-pay | Admitting: Family Medicine

## 2021-10-20 ENCOUNTER — Encounter: Payer: BC Managed Care – PPO | Admitting: Family Medicine

## 2021-10-20 DIAGNOSIS — I10 Essential (primary) hypertension: Secondary | ICD-10-CM

## 2021-10-27 DIAGNOSIS — I480 Paroxysmal atrial fibrillation: Secondary | ICD-10-CM | POA: Diagnosis not present

## 2021-10-27 DIAGNOSIS — R6889 Other general symptoms and signs: Secondary | ICD-10-CM | POA: Diagnosis not present

## 2021-10-27 DIAGNOSIS — I10 Essential (primary) hypertension: Secondary | ICD-10-CM | POA: Diagnosis not present

## 2021-10-28 LAB — BASIC METABOLIC PANEL
BUN/Creatinine Ratio: 11 (ref 9–20)
BUN: 10 mg/dL (ref 6–24)
CO2: 25 mmol/L (ref 20–29)
Calcium: 9.3 mg/dL (ref 8.7–10.2)
Chloride: 103 mmol/L (ref 96–106)
Creatinine, Ser: 0.9 mg/dL (ref 0.76–1.27)
Glucose: 99 mg/dL (ref 70–99)
Potassium: 4.2 mmol/L (ref 3.5–5.2)
Sodium: 141 mmol/L (ref 134–144)
eGFR: 99 mL/min/{1.73_m2} (ref >59)

## 2021-10-28 LAB — CBC
Hematocrit: 47.4 % (ref 37.5–51.0)
Hemoglobin: 15.9 g/dL (ref 13.0–17.7)
MCH: 30.2 pg (ref 26.6–33.0)
MCHC: 33.5 g/dL (ref 31.5–35.7)
MCV: 90 fL (ref 79–97)
Platelets: 305 10*3/uL (ref 150–450)
RBC: 5.27 x10E6/uL (ref 4.14–5.80)
RDW: 12.8 % (ref 11.6–15.4)
WBC: 7.4 10*3/uL (ref 3.4–10.8)

## 2021-10-28 LAB — T4, FREE: Free T4: 1.19 ng/dL (ref 0.82–1.77)

## 2021-10-28 LAB — TSH: TSH: 1.43 u[IU]/mL (ref 0.450–4.500)

## 2021-11-05 DIAGNOSIS — L718 Other rosacea: Secondary | ICD-10-CM | POA: Diagnosis not present

## 2021-11-05 DIAGNOSIS — L814 Other melanin hyperpigmentation: Secondary | ICD-10-CM | POA: Diagnosis not present

## 2021-11-05 DIAGNOSIS — L821 Other seborrheic keratosis: Secondary | ICD-10-CM | POA: Diagnosis not present

## 2021-11-05 DIAGNOSIS — D225 Melanocytic nevi of trunk: Secondary | ICD-10-CM | POA: Diagnosis not present

## 2021-11-11 ENCOUNTER — Ambulatory Visit (INDEPENDENT_AMBULATORY_CARE_PROVIDER_SITE_OTHER): Payer: BC Managed Care – PPO | Admitting: Family Medicine

## 2021-11-11 ENCOUNTER — Encounter: Payer: Self-pay | Admitting: Family Medicine

## 2021-11-11 VITALS — BP 130/84 | HR 64 | Temp 98.2°F | Ht 69.75 in | Wt 243.2 lb

## 2021-11-11 DIAGNOSIS — I10 Essential (primary) hypertension: Secondary | ICD-10-CM

## 2021-11-11 DIAGNOSIS — Z8616 Personal history of COVID-19: Secondary | ICD-10-CM

## 2021-11-11 DIAGNOSIS — L719 Rosacea, unspecified: Secondary | ICD-10-CM

## 2021-11-11 DIAGNOSIS — Z0000 Encounter for general adult medical examination without abnormal findings: Secondary | ICD-10-CM | POA: Diagnosis not present

## 2021-11-11 DIAGNOSIS — F1011 Alcohol abuse, in remission: Secondary | ICD-10-CM | POA: Diagnosis not present

## 2021-11-11 DIAGNOSIS — Z833 Family history of diabetes mellitus: Secondary | ICD-10-CM

## 2021-11-11 DIAGNOSIS — Z87891 Personal history of nicotine dependence: Secondary | ICD-10-CM | POA: Diagnosis not present

## 2021-11-11 DIAGNOSIS — Z8679 Personal history of other diseases of the circulatory system: Secondary | ICD-10-CM

## 2021-11-11 DIAGNOSIS — I428 Other cardiomyopathies: Secondary | ICD-10-CM

## 2021-11-11 MED ORDER — LISINOPRIL-HYDROCHLOROTHIAZIDE 10-12.5 MG PO TABS: 1.0000 | ORAL_TABLET | Freq: Every day | ORAL | 0 refills | Status: DC

## 2021-11-11 MED ORDER — CARVEDILOL 6.25 MG PO TABS: 6.2500 mg | ORAL_TABLET | Freq: Two times a day (BID) | ORAL | 0 refills | Status: DC

## 2021-11-11 NOTE — Progress Notes (Signed)
Complete physical exam  Patient: Daniel Romero   DOB: 09/10/1960   61 y.o. Male  MRN: [id number]  Subjective:    Chief Complaint  Patient presents with   Annual Exam    Fasting     Daniel Romero is a 61 y.o. male who presents today for a complete physical exam. He reports consuming a general diet. Home exercise routine includes walking 5 hrs per week. He generally feels well. He reports sleeping well. He does not have additional problems to discuss today.  He continues on lisinopril/HCTZ and Coreg and having no difficulty with that.  He remains alcohol free.  He is a former smoker.  Does have a previous history of A-fib but that was when he was drinking.  Probably also the case for the cardiomyopathy as he has had no chest pain, shortness of breath.  He did see the dermatologist recently and was placed on doxycycline for his rosacea.  Does have a previous history of COVID.  Otherwise his family and social history as well as health maintenance and immunizations was reviewed.   Most recent fall risk assessment:    10/13/2017    1:54 PM  [place] in the past year? No     Most recent depression screenings:    11/11/2021    3:44 PM 10/19/2020    2:09 PM  PHQ 2/9 Scores  PHQ - 2 Score 0 0      Patient Active Problem List   Diagnosis Date Noted   Rosacea 11/11/2021   History of COVID-19 10/19/2020   Family history of diabetes mellitus 10/16/2018   Cardiomyopathy ([place]) 07/25/2018   History of atrial fibrillation 11/08/2017   Routine general medical examination at a health care facility 10/13/2017   Essential hypertension 06/03/2013   Alcohol abuse, in remission 06/03/2013   Former smoker 06/03/2013   Past Medical History:  Diagnosis Date   Anginal pain (HCC)    Chronic systolic dysfunction of left ventricle    Depression    Hypertension    Obesity    Persistent atrial fibrillation (HCC)    Seasonal allergies    Skin cancer 04/2018   BACK   Past  Surgical History:  Procedure Laterality Date   ATRIAL FIBRILLATION ABLATION  05/08/2018   ATRIAL FIBRILLATION ABLATION N/A 05/08/2018   Procedure: ATRIAL FIBRILLATION ABLATION;  Surgeon: Thompson Grayer, MD;  Location: [place];  Service: Cardiovascular;  Laterality: N/A;   CARDIOVERSION N/A 12/01/2017   Procedure: CARDIOVERSION;  Surgeon: Larey Dresser, MD;  Location: [facility] ENDOSCOPY;  Service: Cardiovascular;  Laterality: N/A;   CARDIOVERSION N/A 03/01/2018   Procedure: CARDIOVERSION;  Surgeon: Elouise Munroe, MD;  Location: [place];  Service: Cardiovascular;  Laterality: N/A;   CARDIOVERSION N/A 04/06/2018   Procedure: CARDIOVERSION;  Surgeon: Thayer Headings, MD;  Location: [facility] ENDOSCOPY;  Service: Cardiovascular;  Laterality: N/A;   COLONOSCOPY  ~ 2015   SKIN BIOPSY Left 03/30/2018   squamous cell carcinoma in situ   WISDOM TOOTH EXTRACTION     Social History   Tobacco Use   Smoking status: Former    Packs/day: 0.75    Years: 5.00    Total pack years: 3.75    Types: Cigarettes    Quit date: 05/20/2013    Years since quitting: 8.4   Smokeless tobacco: Never  Vaping Use   Vaping Use: Never used  Substance Use Topics   Alcohol use: Not Currently   Drug  use: Not Currently   Family History  Problem Relation Age of Onset   Heart disease Mother        chf   Stroke Father    Heart disease Father    Diabetes Father    Colon cancer Neg Hx    Esophageal cancer Neg Hx    Stomach cancer Neg Hx    Rectal cancer Neg Hx    No Known Allergies    Patient Care Team: Denita Lung, MD as PCP - General (Family Medicine) Lorretta Harp, MD as PCP - Cardiology (Cardiology) Thompson Grayer, MD as PCP - Electrophysiology (Cardiology)   Outpatient Medications Prior to Visit  Medication Sig   doxycycline (PERIOSTAT) 20 MG tablet Take 20 mg by mouth 2 (two) times daily.   [DISCONTINUED] carvedilol (COREG) 6.25 MG tablet TAKE ONE TABLET BY MOUTH TWICE DAILY WITH A MEAL    [DISCONTINUED] lisinopril-hydrochlorothiazide (ZESTORETIC) 10-12.5 MG tablet TAKE ONE TABLET BY MOUTH EVERY DAY   No facility-administered medications prior to visit.    Review of Systems  All other systems reviewed and are negative.         Objective:     BP 130/84   Pulse 64   Temp 98.2 F (36.8 C)   Ht 5' 9.75" (1.772 m)   Wt 243 lb 3.2 oz (110.3 kg)   SpO2 98%   BMI 35.15 kg/m  BP Readings from Last 3 Encounters:  11/11/21 130/84  03/26/21 (!) 146/85  10/19/20 102/66   Wt Readings from Last 3 Encounters:  11/11/21 243 lb 3.2 oz (110.3 kg)  03/26/21 244 lb (110.7 kg)  10/19/20 238 lb 12.8 oz (108.3 kg)    The 10-year ASCVD risk score (Arnett DK, et al., 2019) is: 9.4%   Values used to calculate the score:     Age: 46 years     Sex: Male     Is Non-Hispanic African American: No     Diabetic: No     Tobacco smoker: No     Systolic Blood Pressure: 629 mmHg     Is BP treated: Yes     HDL Cholesterol: 49 mg/dL     Total Cholesterol: 212 mg/dL   Physical Exam  Alert and in no distress. Tympanic membranes and canals are normal. Pharyngeal area is normal. Neck is supple without adenopathy or thyromegaly. Cardiac exam shows a regular sinus rhythm without murmurs or gallops. Lungs are clear to auscultation.  Last CBC Lab Results  Component Value Date   WBC 7.4 10/27/2021   HGB 15.9 10/27/2021   HCT 47.4 10/27/2021   MCV 90 10/27/2021   MCH 30.2 10/27/2021   RDW 12.8 10/27/2021   PLT 305 52/84/1324   Last metabolic panel Lab Results  Component Value Date   GLUCOSE 99 10/27/2021   NA 141 10/27/2021   K 4.2 10/27/2021   CL 103 10/27/2021   CO2 25 10/27/2021   BUN 10 10/27/2021   CREATININE 0.90 10/27/2021   EGFR 99 10/27/2021   CALCIUM 9.3 10/27/2021   PROT 7.0 10/19/2020   ALBUMIN 4.9 10/19/2020   LABGLOB 2.1 10/19/2020   AGRATIO 2.3 (H) 10/19/2020   BILITOT 0.5 10/19/2020   ALKPHOS 67 10/19/2020   AST 30 10/19/2020   ALT 44 10/19/2020    ANIONGAP 10 05/14/2018   Last lipids Lab Results  Component Value Date   CHOL 212 (H) 10/19/2020   HDL 49 10/19/2020   LDLCALC 139 (H) 10/19/2020   TRIG 135  10/19/2020   CHOLHDL 4.3 10/19/2020        Assessment & Plan:    Routine general medical examination at a health care facility - Plan: CBC with Differential/Platelet, Comprehensive metabolic panel, Lipid panel  Essential hypertension - Plan: CBC with Differential/Platelet, Comprehensive metabolic panel, carvedilol (COREG) 6.25 MG tablet, lisinopril-hydrochlorothiazide (ZESTORETIC) 10-12.5 MG tablet  Alcohol abuse, in remission  Former smoker  History of atrial fibrillation  Family history of diabetes mellitus  Other cardiomyopathy ([place]) - Plan: carvedilol (COREG) 6.25 MG tablet  History of COVID-19  Rosacea We will continue on his present medication regimen.  Did mention the possibility of him bringing his blood pressure cuff in and measure against ours so we can get accurate reading of his blood pressures at home.  He will get that set up. Immunization History  Administered Date(s) Administered   DT (Pediatric) 11/02/1988   Influenza Split 02/19/2012   Influenza, High Dose Seasonal PF 05/09/2018   Influenza,inj,Quad PF,6+ Mos 12/31/2014   Influenza,inj,quad, With Preservative 12/31/2013   Influenza-Unspecified 12/13/2018, 05/04/2021   Moderna Sars-Covid-2 Vaccination 06/27/2019, 07/26/2019, 04/11/2020   PPD Test 11/02/1988   Tdap 04/13/2012   Zoster Recombinat (Shingrix) 10/19/2020, 12/29/2020    Health Maintenance  Topic Date Due   COVID-19 Vaccine (4 - Moderna series) 06/06/2020   INFLUENZA VACCINE  10/19/2021   TETANUS/TDAP  04/13/2022   COLONOSCOPY (Pts 45-30yr Insurance coverage will need to be confirmed)  02/04/2025   Hepatitis C Screening  Completed   HIV Screening  Completed   Zoster Vaccines- Shingrix  Completed   HPV VACCINES  Aged Out    Discussed health benefits of physical activity, and  encouraged him to engage in regular exercise appropriate for his age and condition.  Problem List Items Addressed This Visit     Alcohol abuse, in remission   [place]omy[place]   Relevant Medications   carvedilol (COREG) 6.25 MG tablet   lisinopril-hydrochlorothiazide (ZESTORETIC) 10-12.5 MG tablet   Essential hypertension   Relevant Medications   carvedilol (COREG) 6.25 MG tablet   lisinopril-hydrochlorothiazide (ZESTORETIC) 10-12.5 MG tablet   Other Relevant Orders   CBC with Differential/Platelet   Comprehensive metabolic panel   Family history of diabetes mellitus   Former smoker   History of atrial fibrillation   History of COVID-19   Rosacea   Routine general medical examination at a health care facility - Primary   Relevant Orders   CBC with Differential/Platelet   Comprehensive metabolic panel   Lipid panel   Return in about 1 year (around 11/12/2022) for cpe .     JJill Alexanders MD

## 2021-11-11 NOTE — Patient Instructions (Signed)
Health Maintenance, Male Adopting a healthy lifestyle and getting preventive care are important in promoting health and wellness. Ask your health care provider about: The right schedule for you to have regular tests and exams. Things you can do on your own to prevent diseases and keep yourself healthy. What should I know about diet, weight, and exercise? Eat a healthy diet  Eat a diet that includes plenty of vegetables, fruits, low-fat dairy products, and lean protein. Do not eat a lot of foods that are high in solid fats, added sugars, or sodium. Maintain a healthy weight Body mass index (BMI) is a measurement that can be used to identify possible weight problems. It estimates body fat based on height and weight. Your health care provider can help determine your BMI and help you achieve or maintain a healthy weight. Get regular exercise Get regular exercise. This is one of the most important things you can do for your health. Most adults should: Exercise for at least 150 minutes each week. The exercise should increase your heart rate and make you sweat (moderate-intensity exercise). Do strengthening exercises at least twice a week. This is in addition to the moderate-intensity exercise. Spend less time sitting. Even light physical activity can be beneficial. Watch cholesterol and blood lipids Have your blood tested for lipids and cholesterol at 59 years of age, then have this test every 5 years. You may need to have your cholesterol levels checked more often if: Your lipid or cholesterol levels are high. You are older than 59 years of age. You are at high risk for heart disease. What should I know about cancer screening? Many types of cancers can be detected early and may often be prevented. Depending on your health history and family history, you may need to have cancer screening at various ages. This may include screening for: Colorectal cancer. Prostate cancer. Skin cancer. Lung  cancer. What should I know about heart disease, diabetes, and high blood pressure? Blood pressure and heart disease High blood pressure causes heart disease and increases the risk of stroke. This is more likely to develop in people who have high blood pressure readings or are overweight. Talk with your health care provider about your target blood pressure readings. Have your blood pressure checked: Every 3-5 years if you are 18-39 years of age. Every year if you are 40 years old or older. If you are between the ages of 65 and 75 and are a current or former smoker, ask your health care provider if you should have a one-time screening for abdominal aortic aneurysm (AAA). Diabetes Have regular diabetes screenings. This checks your fasting blood sugar level. Have the screening done: Once every three years after age 45 if you are at a normal weight and have a low risk for diabetes. More often and at a younger age if you are overweight or have a high risk for diabetes. What should I know about preventing infection? Hepatitis B If you have a higher risk for hepatitis B, you should be screened for this virus. Talk with your health care provider to find out if you are at risk for hepatitis B infection. Hepatitis C Blood testing is recommended for: Everyone born from 1945 through 1965. Anyone with known risk factors for hepatitis C. Sexually transmitted infections (STIs) You should be screened each year for STIs, including gonorrhea and chlamydia, if: You are sexually active and are younger than 59 years of age. You are older than 59 years of age and your   health care provider tells you that you are at risk for this type of infection. Your sexual activity has changed since you were last screened, and you are at increased risk for chlamydia or gonorrhea. Ask your health care provider if you are at risk. Ask your health care provider about whether you are at high risk for HIV. Your health care provider  may recommend a prescription medicine to help prevent HIV infection. If you choose to take medicine to prevent HIV, you should first get tested for HIV. You should then be tested every 3 months for as long as you are taking the medicine. Follow these instructions at home: Alcohol use Do not drink alcohol if your health care provider tells you not to drink. If you drink alcohol: Limit how much you have to 0-2 drinks a day. Know how much alcohol is in your drink. In the [place] one drink equals one 12 oz bottle of beer (355 mL), one 5 oz glass of wine (148 mL), or one 1 oz glass of hard liquor (44 mL). Lifestyle Do not use any products that contain nicotine or tobacco. These products include cigarettes, chewing tobacco, and vaping devices, such as e-cigarettes. If you need help quitting, ask your health care provider. Do not use street drugs. Do not share needles. Ask your health care provider for help if you need support or information about quitting drugs. General instructions Schedule regular health, dental, and eye exams. Stay current with your vaccines. Tell your health care provider if: You often feel depressed. You have ever been abused or do not feel safe at home. Summary Adopting a healthy lifestyle and getting preventive care are important in promoting health and wellness. Follow your health care provider's instructions about healthy diet, exercising, and getting tested or screened for diseases. Follow your health care provider's instructions on monitoring your cholesterol and blood pressure. This information is not intended to replace advice given to you by your health care provider. Make sure you discuss any questions you have with your health care provider. Document Revised: 07/27/2020 Document Reviewed: 07/27/2020 Elsevier Patient Education  2023 [employer]  

## 2021-11-12 LAB — COMPREHENSIVE METABOLIC PANEL
ALT: 53 IU/L — ABNORMAL HIGH (ref 0–44)
AST: 37 IU/L (ref 0–40)
Albumin/Globulin Ratio: 2.2 (ref 1.2–2.2)
Albumin: 4.8 g/dL (ref 3.8–4.9)
Alkaline Phosphatase: 82 IU/L (ref 44–121)
BUN/Creatinine Ratio: 11 (ref 9–20)
BUN: 10 mg/dL (ref 6–24)
Bilirubin Total: 0.6 mg/dL (ref 0.0–1.2)
CO2: 23 mmol/L (ref 20–29)
Calcium: 9.4 mg/dL (ref 8.7–10.2)
Chloride: 101 mmol/L (ref 96–106)
Creatinine, Ser: 0.95 mg/dL (ref 0.76–1.27)
Globulin, Total: 2.2 g/dL (ref 1.5–4.5)
Glucose: 96 mg/dL (ref 70–99)
Potassium: 4.2 mmol/L (ref 3.5–5.2)
Sodium: 138 mmol/L (ref 134–144)
Total Protein: 7 g/dL (ref 6.0–8.5)
eGFR: 93 mL/min/{1.73_m2} (ref >59)

## 2021-11-12 LAB — CBC WITH DIFFERENTIAL/PLATELET
Basophils Absolute: 0.1 10*3/uL (ref 0.0–0.2)
Basos: 1 %
EOS (ABSOLUTE): 0.3 10*3/uL (ref 0.0–0.4)
Eos: 3 %
Hematocrit: 47.4 % (ref 37.5–51.0)
Hemoglobin: 15.8 g/dL (ref 13.0–17.7)
Immature Grans (Abs): 0.1 10*3/uL (ref 0.0–0.1)
Immature Granulocytes: 1 %
Lymphocytes Absolute: 1.9 10*3/uL (ref 0.7–3.1)
Lymphs: 21 %
MCH: 29.6 pg (ref 26.6–33.0)
MCHC: 33.3 g/dL (ref 31.5–35.7)
MCV: 89 fL (ref 79–97)
Monocytes Absolute: 0.6 10*3/uL (ref 0.1–0.9)
Monocytes: 7 %
Neutrophils Absolute: 6.2 10*3/uL (ref 1.4–7.0)
Neutrophils: 67 %
Platelets: 347 10*3/uL (ref 150–450)
RBC: 5.33 x10E6/uL (ref 4.14–5.80)
RDW: 12.4 % (ref 11.6–15.4)
WBC: 9.1 10*3/uL (ref 3.4–10.8)

## 2021-11-12 LAB — LIPID PANEL
Chol/HDL Ratio: 4.2 ratio (ref 0.0–5.0)
Cholesterol, Total: 202 mg/dL — ABNORMAL HIGH (ref 100–199)
HDL: 48 mg/dL (ref >39)
LDL Chol Calc (NIH): 133 mg/dL — ABNORMAL HIGH (ref 0–99)
Triglycerides: 118 mg/dL (ref 0–149)
VLDL Cholesterol Cal: 21 mg/dL (ref 5–40)

## 2021-11-24 ENCOUNTER — Encounter: Payer: Self-pay | Admitting: Internal Medicine

## 2022-01-10 ENCOUNTER — Encounter: Payer: Self-pay | Admitting: Internal Medicine

## 2022-01-15 ENCOUNTER — Encounter: Payer: Self-pay | Admitting: Family Medicine

## 2022-04-14 ENCOUNTER — Other Ambulatory Visit: Payer: Self-pay | Admitting: Family Medicine

## 2022-04-14 DIAGNOSIS — I428 Other cardiomyopathies: Secondary | ICD-10-CM

## 2022-04-14 DIAGNOSIS — I10 Essential (primary) hypertension: Secondary | ICD-10-CM

## 2022-04-19 ENCOUNTER — Ambulatory Visit (HOSPITAL_COMMUNITY)
Admission: RE | Admit: 2022-04-19 | Discharge: 2022-04-19 | Disposition: A | Discharge status: Home / Self Care | Payer: BC Managed Care – PPO | Source: Ambulatory Visit | Attending: Physician Assistant | Admitting: Physician Assistant

## 2022-04-19 ENCOUNTER — Encounter (HOSPITAL_COMMUNITY): Payer: Self-pay | Admitting: Physician Assistant

## 2022-04-19 VITALS — BP 134/90 | HR 68 | Ht 69.75 in | Wt 251.8 lb

## 2022-04-19 DIAGNOSIS — E669 Obesity, unspecified: Secondary | ICD-10-CM | POA: Diagnosis not present

## 2022-04-19 DIAGNOSIS — I4819 Other persistent atrial fibrillation: Secondary | ICD-10-CM

## 2022-04-19 DIAGNOSIS — I10 Essential (primary) hypertension: Secondary | ICD-10-CM | POA: Diagnosis not present

## 2022-04-19 DIAGNOSIS — I429 Cardiomyopathy, unspecified: Secondary | ICD-10-CM | POA: Diagnosis not present

## 2022-04-19 DIAGNOSIS — Z6836 Body mass index (BMI) 36.0-36.9, adult: Secondary | ICD-10-CM | POA: Diagnosis not present

## 2022-04-19 NOTE — Progress Notes (Signed)
Primary Care Physician: Denita Lung, MD Referring Physician: Dr. Gwenlyn Found Primary EP: Dr Massie Bougie Daniel Romero is a 62 y.o. male with a h/o HTN, asymptomatic, newly diagnosed afib in August of this year being referred to Dr. Gwenlyn Found at that time. He was cardioverted,12/01/17, after sufficient time of being anticoagulanted. This was successful but he was back in afib on f/u with Dr. Gwenlyn Found.Echo showed an EF of 40-45%. It was discussed with pt even thought he is asymptomatic his reduced EF may be 2/2 to [facility] and it would be beneficial to try to restore SR going forward. He currently does not drink or smoke. Mod caffeine use. Denies a snoring history.   F/u in afib clinic, 12/10. He is here for Tikosyn admit. He stopped lisinopril/hctz last Thursday. He is aware of cost of drug and can afford.   F/u 12/30. Unfortunately, pt failed Tikosyn to restore SR  and this was stopped and he was loaded on Amiodarone. He is now on amiodarone  X 2 weeks, continues in rate controlled afib, and hopefully this will be a bridge to ablation down the road.   F/u afib clinic 04/12/18. S/p DCCV 04/06/18 which was initially successful but he reports early return to afib the next morning. He did notice a mild improvement in his overall wellbeing when he was in normal rhythm. He has continued on amiodarone 200 mg BID and Xarelto 20 mg daily with no missed doses.  F/u in afib clinic 07/05/18. He asked to be seen as he was seeing his counselor yesterday and he mentioned he had had some shortness of breath. He ia about 6 weeks ost ablation. His counselor said that he needed to be seen right away as a PA that had worked with him, c/o of shortness of breath and died a few days later. He only notices it going up steps carrying boxes as he has been trying to organize some things.. He doe not notice any PND or orthopnea. No pedal edema. He has had around 10 lb wight gain but his father died around one month ago and he admits to  stress/comfort eating in the last few weeks.   F/u in afib clinic, 06/04/19. He reports staying in SR. EKG shows Sinus brady at 57 bpm. He feels well.  Follow up in the AF clinic 04/19/22. Patient reports that he has done well since his last visit. He has not had any symptoms of afib or any episodes detected on his Kardia mobile.   Today, he denies symptoms of palpitations, chest pain, orthopnea, PND, lower extremity edema, dizziness, presyncope, syncope, or neurologic sequela. The patient is tolerating medications without difficulties and is otherwise without complaint today.   Past Medical History:  Diagnosis Date   Anginal pain ([place])    Chronic systolic dysfunction of left ventricle    Depression    Hypertension    Obesity    Persistent atrial fibrillation (HCC)    Seasonal allergies    Skin cancer 04/2018   BACK   Past Surgical History:  Procedure Laterality Date   ATRIAL FIBRILLATION ABLATION  05/08/2018   ATRIAL FIBRILLATION ABLATION N/A 05/08/2018   Procedure: ATRIAL FIBRILLATION ABLATION;  Surgeon: Thompson Grayer, MD;  Location: [place] CV LAB;  Service: Cardiovascular;  Laterality: N/A;   CARDIOVERSION N/A 12/01/2017   Procedure: CARDIOVERSION;  Surgeon: Larey Dresser, MD;  Location: [facility] ENDOSCOPY;  Service: Cardiovascular;  Laterality: N/A;   CARDIOVERSION N/A 03/01/2018   Procedure: CARDIOVERSION;  Surgeon: Elouise Munroe, MD;  Location: [facility] ENDOSCOPY;  Service: Cardiovascular;  Laterality: N/A;   CARDIOVERSION N/A 04/06/2018   Procedure: CARDIOVERSION;  Surgeon: Thayer Headings, MD;  Location: [place];  Service: Cardiovascular;  Laterality: N/A;   COLONOSCOPY  ~ 2015   SKIN BIOPSY Left 03/30/2018   squamous cell carcinoma in situ   WISDOM TOOTH EXTRACTION      Current Outpatient Medications  Medication Sig Dispense Refill   aspirin EC 81 MG tablet Take 81 mg by mouth daily. Swallow whole.     carvedilol (COREG) 6.25 MG tablet Take 1 tablet (6.25 mg total)  by mouth 2 (two) times daily with a meal. 180 tablet 0   doxycycline (PERIOSTAT) 20 MG tablet Take 20 mg by mouth 2 (two) times daily.     lisinopril-hydrochlorothiazide (ZESTORETIC) 10-12.5 MG tablet Take 1 tablet by mouth daily. 90 tablet 0   No current facility-administered medications for this encounter.    No Known Allergies  Social History   Socioeconomic History   Marital status: Single    Spouse name: Not on file   Number of children: Not on file   Years of education: Not on file   Highest education level: Not on file  Occupational History   Not on file  Tobacco Use   Smoking status: Former    Packs/day: 0.75    Years: 5.00    Total pack years: 3.75    Types: Cigarettes    Quit date: 05/20/2013    Years since quitting: 8.9   Smokeless tobacco: Never   Tobacco comments:    Former smoker 04/19/22  Vaping Use   Vaping Use: Never used  Substance and Sexual Activity   Alcohol use: Not Currently   Drug use: Not Currently   Sexual activity: Yes  Other Topics Concern   Not on file  Social History Narrative   Not on file   Social Determinants of Health   Financial Resource Strain: Not on file  Food Insecurity: Not on file  Transportation Needs: Not on file  Physical Activity: Not on file  Stress: Not on file  Social Connections: Not on file  Intimate Partner Violence: Not on file    Family History  Problem Relation Age of Onset   Heart disease Mother        chf   Stroke Father    Heart disease Father    Diabetes Father    Colon cancer Neg Hx    Esophageal cancer Neg Hx    Stomach cancer Neg Hx    Rectal cancer Neg Hx     ROS- All systems are reviewed and negative except as per the HPI above  Physical Exam: Vitals:   04/19/22 0830  BP: (!) 134/90  Pulse: 68  Weight: 114.2 kg  Height: 5' 9.75" (1.772 m)   Wt Readings from Last 3 Encounters:  04/19/22 114.2 kg  11/11/21 110.3 kg  03/26/21 110.7 kg    Labs: Lab Results  Component Value Date    NA 138 11/11/2021   K 4.2 11/11/2021   CL 101 11/11/2021   CO2 23 11/11/2021   GLUCOSE 96 11/11/2021   BUN 10 11/11/2021   CREATININE 0.95 11/11/2021   CALCIUM 9.4 11/11/2021   MG 2.0 03/02/2018   Lab Results  Component Value Date   INR 1.8 05/14/2018   Lab Results  Component Value Date   CHOL 202 (H) 11/11/2021   HDL 48 11/11/2021   LDLCALC 133 (H)  11/11/2021   TRIG 118 11/11/2021    GEN- The patient is a well appearing obese male, alert and oriented x 3 today.   HEENT-head normocephalic, atraumatic, sclera clear, conjunctiva pink, hearing intact, trachea midline. Lungs- Clear to ausculation bilaterally, normal work of breathing Heart- Regular rate and rhythm, no murmurs, rubs or gallops  GI- soft, NT, ND, + BS Extremities- no clubbing, cyanosis, or edema MS- no significant deformity or atrophy Skin- no rash or lesion Psych- euthymic mood, full affect Neuro- strength and sensation are intact   EKG-  SR, brief run of PACs Vent. rate 68 BPM PR interval 164 ms QRS duration 92 ms QT/QTcB 394/418 ms   Echo-07/30/18  1. The left ventricle has normal systolic function, with an ejection  fraction of 55-60%. The cavity size was normal. Left ventricular diastolic  Doppler parameters are indeterminate.   2. The right ventricle has normal systolic function. The cavity was  normal. There is no increase in right ventricular wall thickness.   3. Left atrial size was mildly dilated.   4. Mild thickening of the mitral valve leaflet.   5. The aortic valve is tricuspid.   6. The aortic root is normal in size and structure.     CHA2DS2-VASc Score = 1  The patient's score is based upon: CHF History: 0 HTN History: 1 Diabetes History: 0 Stroke History: 0 Vascular Disease History: 0 Age Score: 0 Gender Score: 0       ASSESSMENT AND PLAN: 1. Persistent Atrial Fibrillation (ICD10:  I48.19) The patient's CHA2DS2-VASc score is 1, indicating a 0.6% annual risk of stroke.    Failed Tikosyn as it did not restore SR. He was loaded on amiodarone as a bridge to ablation which was done 05/08/18 Patient appears to be maintaining SR. Not currently on anticoagulation with low CV score. Kardia mobile for home monitoring.  2. HTN Stable, no changes today.  3. Cardiomyopathy Suspected tachycardia mediated EF normalized with SR.  4. Obesity  Body mass index is 36.39 kg/m. Lifestyle modification was discussed and encouraged including regular physical activity and weight reduction. Will refer to [facility] PREP   Follow up in the AF clinic in one year.     [facility] [address] [place], [place] [address] [phone]

## 2022-04-20 ENCOUNTER — Telehealth: Payer: Self-pay | Admitting: *Deleted

## 2022-04-20 NOTE — Telephone Encounter (Signed)
Contacted regarding PREP Class referral. Left voice message to return my call for more information.

## 2022-04-26 ENCOUNTER — Telehealth: Payer: Self-pay | Admitting: *Deleted

## 2022-04-26 NOTE — Telephone Encounter (Signed)
Second message left regarding PREP Class referral. Voice message left to call back for more information.

## 2022-04-28 ENCOUNTER — Encounter (HOSPITAL_COMMUNITY): Payer: Self-pay | Admitting: *Deleted

## 2022-07-19 ENCOUNTER — Other Ambulatory Visit: Payer: Self-pay | Admitting: Family Medicine

## 2022-07-19 DIAGNOSIS — I428 Other cardiomyopathies: Secondary | ICD-10-CM

## 2022-07-19 DIAGNOSIS — I10 Essential (primary) hypertension: Secondary | ICD-10-CM

## 2022-10-17 ENCOUNTER — Other Ambulatory Visit: Payer: Self-pay | Admitting: Family Medicine

## 2022-10-17 DIAGNOSIS — I428 Other cardiomyopathies: Secondary | ICD-10-CM

## 2022-10-17 DIAGNOSIS — I10 Essential (primary) hypertension: Secondary | ICD-10-CM

## 2022-11-14 DIAGNOSIS — L814 Other melanin hyperpigmentation: Secondary | ICD-10-CM | POA: Diagnosis not present

## 2022-11-14 DIAGNOSIS — D225 Melanocytic nevi of trunk: Secondary | ICD-10-CM | POA: Diagnosis not present

## 2022-11-14 DIAGNOSIS — L718 Other rosacea: Secondary | ICD-10-CM | POA: Diagnosis not present

## 2022-11-14 DIAGNOSIS — L821 Other seborrheic keratosis: Secondary | ICD-10-CM | POA: Diagnosis not present

## 2022-11-15 ENCOUNTER — Ambulatory Visit: Payer: BC Managed Care – PPO | Admitting: Family Medicine

## 2022-11-15 ENCOUNTER — Encounter: Payer: Self-pay | Admitting: Family Medicine

## 2022-11-15 VITALS — BP 138/84 | HR 72 | Ht 69.25 in | Wt 251.0 lb

## 2022-11-15 DIAGNOSIS — F1011 Alcohol abuse, in remission: Secondary | ICD-10-CM | POA: Diagnosis not present

## 2022-11-15 DIAGNOSIS — I425 Other restrictive cardiomyopathy: Secondary | ICD-10-CM | POA: Diagnosis not present

## 2022-11-15 DIAGNOSIS — L719 Rosacea, unspecified: Secondary | ICD-10-CM

## 2022-11-15 DIAGNOSIS — Z87891 Personal history of nicotine dependence: Secondary | ICD-10-CM

## 2022-11-15 DIAGNOSIS — I10 Essential (primary) hypertension: Secondary | ICD-10-CM

## 2022-11-15 DIAGNOSIS — I428 Other cardiomyopathies: Secondary | ICD-10-CM

## 2022-11-15 DIAGNOSIS — Z23 Encounter for immunization: Secondary | ICD-10-CM | POA: Diagnosis not present

## 2022-11-15 DIAGNOSIS — Z8679 Personal history of other diseases of the circulatory system: Secondary | ICD-10-CM

## 2022-11-15 DIAGNOSIS — Z0000 Encounter for general adult medical examination without abnormal findings: Secondary | ICD-10-CM

## 2022-11-15 DIAGNOSIS — Z8616 Personal history of COVID-19: Secondary | ICD-10-CM

## 2022-11-15 MED ORDER — LISINOPRIL-HYDROCHLOROTHIAZIDE 10-12.5 MG PO TABS
1.0000 | ORAL_TABLET | Freq: Every day | ORAL | 0 refills | Status: DC
Start: 2022-11-15 — End: 2023-04-18

## 2022-11-15 MED ORDER — CARVEDILOL 6.25 MG PO TABS
6.2500 mg | ORAL_TABLET | Freq: Two times a day (BID) | ORAL | 0 refills | Status: DC
Start: 2022-11-15 — End: 2023-04-18

## 2022-11-15 NOTE — Progress Notes (Signed)
Complete physical exam  Patient: Daniel Romero   DOB: [birth year]   62 y.o. Male  MRN: [id number]  Subjective:    Chief Complaint  Patient presents with   Annual Exam    Fasting annual exam.     Waldron Matott is a 62 y.o. male who presents today for a complete physical exam. He reports consuming a general diet.  No current exercise.  He generally feels well. He reports sleeping well. He does not have additional problems to discuss today.  He continues on his blood pressure medications without difficulty.  He is also using doxycycline for his rosacea and sees dermatology regularly.  Does have a previous history of COVID.  He has remained alcohol free for over 20 years.  Does not smoke.  Does have a previous history of atrial fibs however that was when he was drinking.  Work is going well.   Most recent fall risk assessment:    11/15/2022    3:01 PM  Fall Risk   Falls in the past year? 0  Number falls in past yr: 0  Injury with Fall? 0  Risk for fall due to : No Fall Risks  Follow up Falls evaluation completed     Most recent depression screenings:    11/15/2022    3:01 PM 11/11/2021    3:44 PM  PHQ 2/9 Scores  PHQ - 2 Score 0 0    Vision:Within last year    Patient Care Team: Ronnald Nian, MD as PCP - General (Family Medicine) Runell Gess, MD as PCP - Cardiology (Cardiology) Hillis Range, MD (Inactive) as PCP - Electrophysiology (Cardiology)   Outpatient Medications Prior to Visit  Medication Sig Note   aspirin EC 81 MG tablet Take 81 mg by mouth daily. Swallow whole.    doxycycline (PERIOSTAT) 20 MG tablet Take 20 mg by mouth 2 (two) times daily.    Multiple Vitamins-Minerals (EYE VITAMINS PO) Take 2 Capfuls by mouth daily. 11/15/2022: [facility]   [DISCONTINUED] carvedilol (COREG) 6.25 MG tablet Take 1 tablet (6.25 mg total) by mouth 2 (two) times daily with a meal.    [DISCONTINUED] lisinopril-hydrochlorothiazide (ZESTORETIC) 10-12.5 MG tablet  Take 1 tablet by mouth daily.    No facility-administered medications prior to visit.    Review of Systems  All other systems reviewed and are negative.  Family and social history as well as health maintenance immunizations was reviewed.       Objective:       Physical Exam  Alert and in no distress. Tympanic membranes and canals are normal. Pharyngeal area is normal. Neck is supple without adenopathy or thyromegaly. Cardiac exam shows a regular sinus rhythm without murmurs or gallops. Lungs are clear to auscultation.      Assessment & Plan:    Routine general medical examination at a health care facility - Plan: CBC with Differential/Platelet, Comprehensive metabolic panel, Lipid panel  Alcohol abuse, in remission  Other restrictive cardiomyopathy (HCC)  Essential hypertension - Plan: CBC with Differential/Platelet, Comprehensive metabolic panel, carvedilol (COREG) 6.25 MG tablet, lisinopril-hydrochlorothiazide (ZESTORETIC) 10-12.5 MG tablet  Former smoker  History of atrial fibrillation  History of COVID-19  Rosacea  Need for Tdap vaccination - Plan: Tdap vaccine greater than or equal to 7yo IM  Other cardiomyopathy (HCC) - Plan: carvedilol (COREG) 6.25 MG tablet  Immunization History  Administered Date(s) Administered   DT (Pediatric) 11/02/1988   Influenza Split 02/19/2012   Influenza, High Dose Seasonal PF  05/09/2018   Influenza,inj,Quad PF,6+ Mos 12/31/2014   Influenza,inj,quad, With Preservative 12/31/2013   Influenza-Unspecified 12/13/2018, 05/04/2021, 01/14/2022   Moderna Covid-19 Vaccine Bivalent Booster 110yrs & up 04/02/2021   Moderna Sars-Covid-2 Vaccination 06/27/2019, 07/26/2019, 04/11/2020   PPD Test 11/02/1988   Tdap 04/13/2012, 11/15/2022   Zoster Recombinant(Shingrix) 10/19/2020, 12/29/2020    Health Maintenance  Topic Date Due   COVID-19 Vaccine (5 - 2023-24 season) 11/19/2021   INFLUENZA VACCINE  10/20/2022   Colonoscopy  02/04/2025    DTaP/Tdap/Td (4 - Td or Tdap) 11/14/2032   Hepatitis C Screening  Completed   HIV Screening  Completed   Zoster Vaccines- Shingrix  Completed   HPV VACCINES  Aged Out    Discussed health benefits of physical activity, and encouraged him to engage in regular exercise appropriate for his age and condition.  Problem List Items Addressed This Visit     Alcohol abuse, in remission   RESOLVED: Cardiomyopathy (HCC)   Relevant Medications   carvedilol (COREG) 6.25 MG tablet   lisinopril-hydrochlorothiazide (ZESTORETIC) 10-12.5 MG tablet   Essential hypertension   Relevant Medications   carvedilol (COREG) 6.25 MG tablet   lisinopril-hydrochlorothiazide (ZESTORETIC) 10-12.5 MG tablet   Other Relevant Orders   CBC with Differential/Platelet   Comprehensive metabolic panel   Former smoker   History of atrial fibrillation   History of COVID-19   Rosacea   Routine general medical examination at a health care facility - Primary   Relevant Orders   CBC with Differential/Platelet   Comprehensive metabolic panel   Lipid panel   Other Visit Diagnoses     Need for Tdap vaccination       Relevant Orders   Tdap vaccine greater than or equal to 7yo IM (Completed)     I again encouraged him to make diet and exercise changes to help with his present weight problem. Follow-up 1 year.   Sharlot Gowda, MD

## 2022-11-16 LAB — CBC WITH DIFFERENTIAL/PLATELET
Basophils Absolute: 0.1 10*3/uL (ref 0.0–0.2)
Basos: 1 %
EOS (ABSOLUTE): 0.3 10*3/uL (ref 0.0–0.4)
Eos: 3 %
Hematocrit: 48.4 % (ref 37.5–51.0)
Hemoglobin: 15.9 g/dL (ref 13.0–17.7)
Immature Grans (Abs): 0.1 10*3/uL (ref 0.0–0.1)
Immature Granulocytes: 1 %
Lymphocytes Absolute: 2 10*3/uL (ref 0.7–3.1)
Lymphs: 20 %
MCH: 28.9 pg (ref 26.6–33.0)
MCHC: 32.9 g/dL (ref 31.5–35.7)
MCV: 88 fL (ref 79–97)
Monocytes Absolute: 0.7 10*3/uL (ref 0.1–0.9)
Monocytes: 7 %
Neutrophils Absolute: 6.6 10*3/uL (ref 1.4–7.0)
Neutrophils: 68 %
Platelets: 363 10*3/uL (ref 150–450)
RBC: 5.5 x10E6/uL (ref 4.14–5.80)
RDW: 12.9 % (ref 11.6–15.4)
WBC: 9.6 10*3/uL (ref 3.4–10.8)

## 2022-11-16 LAB — LIPID PANEL
Chol/HDL Ratio: 4.2 ratio (ref 0.0–5.0)
Cholesterol, Total: 212 mg/dL — ABNORMAL HIGH (ref 100–199)
HDL: 50 mg/dL (ref >39)
LDL Chol Calc (NIH): 138 mg/dL — ABNORMAL HIGH (ref 0–99)
Triglycerides: 134 mg/dL (ref 0–149)
VLDL Cholesterol Cal: 24 mg/dL (ref 5–40)

## 2022-11-16 LAB — COMPREHENSIVE METABOLIC PANEL
ALT: 32 IU/L (ref 0–44)
AST: 23 IU/L (ref 0–40)
Albumin: 4.8 g/dL (ref 3.8–4.9)
Alkaline Phosphatase: 70 IU/L (ref 44–121)
BUN/Creatinine Ratio: 11 (ref 9–20)
BUN: 10 mg/dL (ref 6–24)
Bilirubin Total: 0.7 mg/dL (ref 0.0–1.2)
CO2: 26 mmol/L (ref 20–29)
Calcium: 9.7 mg/dL (ref 8.7–10.2)
Chloride: 100 mmol/L (ref 96–106)
Creatinine, Ser: 0.89 mg/dL (ref 0.76–1.27)
Globulin, Total: 2.2 g/dL (ref 1.5–4.5)
Glucose: 90 mg/dL (ref 70–99)
Potassium: 4.3 mmol/L (ref 3.5–5.2)
Sodium: 139 mmol/L (ref 134–144)
Total Protein: 7 g/dL (ref 6.0–8.5)
eGFR: 99 mL/min/{1.73_m2} (ref >59)

## 2023-04-18 ENCOUNTER — Other Ambulatory Visit: Payer: Self-pay | Admitting: Family Medicine

## 2023-04-18 DIAGNOSIS — I428 Other cardiomyopathies: Secondary | ICD-10-CM

## 2023-04-18 DIAGNOSIS — I10 Essential (primary) hypertension: Secondary | ICD-10-CM

## 2023-05-10 ENCOUNTER — Encounter: Payer: Self-pay | Admitting: *Deleted

## 2023-05-16 ENCOUNTER — Encounter: Payer: Self-pay | Admitting: Internal Medicine

## 2023-06-26 ENCOUNTER — Ambulatory Visit: Payer: Self-pay

## 2023-06-26 NOTE — Telephone Encounter (Signed)
 Chief Complaint: Rectal bleeding Symptoms: Blood mixed in stool, red on tissue, pink water Frequency: Onset x 3 since Thursday Pertinent Negatives: Patient denies other symptoms Disposition: [] ED /[] Urgent Care (no appt availability in office) / [x] Appointment(In office/virtual)/ []  [facility]/ [] Home Care/ [] Refused Recommended Disposition /[] [facility]/ []  Follow-up with PCP Additional Notes: Patient reports blood mixed in stool x 3 different times since Thursday, no other symptoms. Advised OV tomorrow with a different provider due to no availability with PCP, he agrees.   Copied from CRM [id number]. Topic: Clinical - Red Word Triage >> Jun 26, 2023  9:21 AM Elizebeth Brooking wrote: [facility] that prompted transfer to Nurse Triage: Patient called in stating he has been experiencing some rectal bleeding Reason for Disposition  MILD rectal bleeding (more than just a few drops or streaks)  Answer Assessment - Initial Assessment Questions 1. APPEARANCE of BLOOD: "What color is it?" "Is it passed separately, on the surface of the stool, or mixed in with the stool?"      Surface, bright red 2. AMOUNT: "How much blood was passed?"      Turned the water pink, toilet paper bright red when whipped, no clots 3. FREQUENCY: "How many times has blood been passed with the stools?"      3 times since Thursday 4. ONSET: "When was the blood first seen in the stools?" (Days or weeks)      Thursday morning 5. DIARRHEA: "Is there also some diarrhea?" If Yes, ask: "How many diarrhea stools in the past 24 hours?"      No 6. CONSTIPATION: "Do you have constipation?" If Yes, ask: "How bad is it?"     No 7. RECURRENT SYMPTOMS: "Have you had blood in your stools before?" If Yes, ask: "When was the last time?" and "What happened that time?"      No 8. BLOOD THINNERS: "Do you take any blood thinners?" (e.g., Coumadin/warfarin, Pradaxa/dabigatran, aspirin)     Baby aspirin 81mg  BID off and on, but  daily  9. OTHER SYMPTOMS: "Do you have any other symptoms?"  (e.g., abdomen pain, vomiting, dizziness, fever)     No  Protocols used: Rectal Bleeding-A-AH

## 2023-06-27 ENCOUNTER — Ambulatory Visit (INDEPENDENT_AMBULATORY_CARE_PROVIDER_SITE_OTHER): Admitting: Medical

## 2023-06-27 ENCOUNTER — Encounter: Payer: Self-pay | Admitting: Medical

## 2023-06-27 VITALS — BP 138/74 | HR 60 | Ht 69.25 in | Wt 245.8 lb

## 2023-06-27 DIAGNOSIS — K921 Melena: Secondary | ICD-10-CM

## 2023-06-27 MED ORDER — HYDROCORTISONE ACETATE 25 MG RE SUPP: 25.0000 mg | Freq: Two times a day (BID) | RECTAL | 0 refills | Status: DC

## 2023-06-27 NOTE — Progress Notes (Signed)
 Subjective:  Daniel Romero is a 63 y.o. male who presents for Chief Complaint  Patient presents with   Rectal Bleeding    3 different times. Thursday last week, Friday and Sunday. After BM, paper was bright red and decent amount of blood.      Here for complaint of blood in the stool.  He notes no prior issues with blood in the stool until this past week.  On Thursday last week then Friday then Sunday he saw some bright red blood on the toilet paper.  No frank blood in the water.  He only happened 3 different times.  He has not been having any issues with blood in the stool prior to that.  He does not feel weak or lightheaded.  He sometimes has occasional loose stool but denies a lot of problems with bowels.  No significant history of constipation or chronic diarrhea.  He generally has 1 bowel movement a day, soft and formed sometimes it can fluctuate between soft and hard.  He has been eating a lot of popcorn and wonders if that flared up anything.  Otherwise in normal state of health.  No other aggravating or relieving factors.    No other c/o.  Past Medical History:  Diagnosis Date   Anginal pain (HCC)    Chronic systolic dysfunction of left ventricle    Depression    Hypertension    Obesity    Persistent atrial fibrillation (HCC)    Seasonal allergies    Skin cancer 04/2018   BACK   Current Outpatient Medications on File Prior to Visit  Medication Sig Dispense Refill   aspirin EC 81 MG tablet Take 81 mg by mouth daily. Swallow whole.     carvedilol (COREG) 6.25 MG tablet Take 1 tablet (6.25 mg total) by mouth 2 (two) times daily with a meal. 180 tablet 1   doxycycline (PERIOSTAT) 20 MG tablet Take 20 mg by mouth 2 (two) times daily.     lisinopril-hydrochlorothiazide (ZESTORETIC) 10-12.5 MG tablet Take 1 tablet by mouth daily. 90 tablet 1   Multiple Vitamins-Minerals (EYE VITAMINS PO) Take 2 Capfuls by mouth daily.     No current facility-administered medications on file  prior to visit.   Past Surgical History:  Procedure Laterality Date   ATRIAL FIBRILLATION ABLATION  05/08/2018   ATRIAL FIBRILLATION ABLATION N/A 05/08/2018   Procedure: ATRIAL FIBRILLATION ABLATION;  Surgeon: Hillis Range, MD;  Location: [facility] INVASIVE CV LAB;  Service: Cardiovascular;  Laterality: N/A;   CARDIOVERSION N/A 12/01/2017   Procedure: CARDIOVERSION;  Surgeon: Laurey Morale, MD;  Location: [facility] ENDOSCOPY;  Service: Cardiovascular;  Laterality: N/A;   CARDIOVERSION N/A 03/01/2018   Procedure: CARDIOVERSION;  Surgeon: Parke Poisson, MD;  Location: [facility] ENDOSCOPY;  Service: Cardiovascular;  Laterality: N/A;   CARDIOVERSION N/A 04/06/2018   Procedure: CARDIOVERSION;  Surgeon: Vesta Mixer, MD;  Location: [facility] ENDOSCOPY;  Service: Cardiovascular;  Laterality: N/A;   COLONOSCOPY  ~ 2015   SKIN BIOPSY Left 03/30/2018   squamous cell carcinoma in situ   WISDOM TOOTH EXTRACTION       The following portions of the patient's history were reviewed and updated as appropriate: allergies, current medications, past family history, past medical history, past social history, past surgical history and problem list.  ROS Otherwise as in subjective above  Objective: BP 138/74   Pulse 60   Ht 5' 9.25" (1.759 m)   Wt 245 lb 12.8 oz (111.5 kg)   SpO2 98%  BMI 36.04 kg/m   General appearance: alert, no distress, well developed, well nourished Abdomen: +bs, soft, non tender, non distended, no masses, no hepatomegaly, no splenomegaly Rectal: No obvious external abnormality, nontender,, no obvious hemorrhoid   Assessment: Encounter Diagnosis  Name Primary?   Blood in stool Yes     Plan: We discussed the symptoms and concerns.  Given no prior history of blood in the stool and given the few brief episodes he has had recently more than likely internal hemorrhoid flareup.  We discussed other potential causes of blood in the stool.  Advised good water intake at least 80 to 100 ounces of  water daily, good fiber intake.  Consider fiber supplement daily.  Begin suppositories if you see any more bleeding this week.  You can do the suppositories twice a day for couple day in a row.  Consider doing some hot bath soaks over the next few days.  Avoid constipation or frequent bowel movements or loose stool.  Last colonoscopy 2016.  Advised if any additional bleeding in the stool in the next month or 2 or 3 then we will refer back to GI for updated colonoscopy.  He will let us know.  No other evaluation today.  No signs of hypertension or anemia.  I reviewed a CBC he had done from August 2024 that was normal  [name] was seen today for rectal bleeding.  Diagnoses and all orders for this visit:  Blood in stool  Other orders -     hydrocortisone (ANUSOL-HC) 25 MG suppository; Place 1 suppository (25 mg total) rectally 2 (two) times daily.    Follow up: As needed

## 2023-09-27 ENCOUNTER — Other Ambulatory Visit: Payer: Self-pay | Admitting: Family Medicine

## 2023-09-27 DIAGNOSIS — I428 Other cardiomyopathies: Secondary | ICD-10-CM

## 2023-09-27 DIAGNOSIS — I10 Essential (primary) hypertension: Secondary | ICD-10-CM

## 2023-11-15 DIAGNOSIS — L814 Other melanin hyperpigmentation: Secondary | ICD-10-CM | POA: Diagnosis not present

## 2023-11-15 DIAGNOSIS — L538 Other specified erythematous conditions: Secondary | ICD-10-CM | POA: Diagnosis not present

## 2023-11-15 DIAGNOSIS — Z789 Other specified health status: Secondary | ICD-10-CM | POA: Diagnosis not present

## 2023-11-15 DIAGNOSIS — D225 Melanocytic nevi of trunk: Secondary | ICD-10-CM | POA: Diagnosis not present

## 2023-11-15 DIAGNOSIS — L821 Other seborrheic keratosis: Secondary | ICD-10-CM | POA: Diagnosis not present

## 2023-11-15 DIAGNOSIS — L820 Inflamed seborrheic keratosis: Secondary | ICD-10-CM | POA: Diagnosis not present

## 2023-11-15 DIAGNOSIS — L2989 Other pruritus: Secondary | ICD-10-CM | POA: Diagnosis not present

## 2023-11-21 ENCOUNTER — Ambulatory Visit (INDEPENDENT_AMBULATORY_CARE_PROVIDER_SITE_OTHER): Payer: BC Managed Care – PPO | Admitting: Family Medicine

## 2023-11-21 VITALS — BP 126/68 | HR 66 | Ht 70.0 in | Wt 244.6 lb

## 2023-11-21 DIAGNOSIS — Z1211 Encounter for screening for malignant neoplasm of colon: Secondary | ICD-10-CM

## 2023-11-21 DIAGNOSIS — Z0000 Encounter for general adult medical examination without abnormal findings: Secondary | ICD-10-CM

## 2023-11-21 DIAGNOSIS — F1011 Alcohol abuse, in remission: Secondary | ICD-10-CM | POA: Diagnosis not present

## 2023-11-21 DIAGNOSIS — I10 Essential (primary) hypertension: Secondary | ICD-10-CM | POA: Diagnosis not present

## 2023-11-21 DIAGNOSIS — Z833 Family history of diabetes mellitus: Secondary | ICD-10-CM

## 2023-11-21 DIAGNOSIS — L719 Rosacea, unspecified: Secondary | ICD-10-CM

## 2023-11-21 DIAGNOSIS — Z87891 Personal history of nicotine dependence: Secondary | ICD-10-CM | POA: Diagnosis not present

## 2023-11-21 DIAGNOSIS — Z13220 Encounter for screening for lipoid disorders: Secondary | ICD-10-CM | POA: Diagnosis not present

## 2023-11-21 DIAGNOSIS — Z8679 Personal history of other diseases of the circulatory system: Secondary | ICD-10-CM

## 2023-11-21 DIAGNOSIS — Z136 Encounter for screening for cardiovascular disorders: Secondary | ICD-10-CM | POA: Diagnosis not present

## 2023-11-21 DIAGNOSIS — L821 Other seborrheic keratosis: Secondary | ICD-10-CM | POA: Insufficient documentation

## 2023-11-21 DIAGNOSIS — E669 Obesity, unspecified: Secondary | ICD-10-CM

## 2023-11-21 DIAGNOSIS — I428 Other cardiomyopathies: Secondary | ICD-10-CM

## 2023-11-21 DIAGNOSIS — Z23 Encounter for immunization: Secondary | ICD-10-CM

## 2023-11-21 LAB — LIPID PANEL

## 2023-11-21 MED ORDER — LISINOPRIL-HYDROCHLOROTHIAZIDE 10-12.5 MG PO TABS: 1.0000 | ORAL_TABLET | Freq: Every day | ORAL | 1 refills | Status: AC

## 2023-11-21 MED ORDER — CARVEDILOL 6.25 MG PO TABS: 6.2500 mg | ORAL_TABLET | Freq: Two times a day (BID) | ORAL | 1 refills | Status: AC

## 2023-11-21 NOTE — Progress Notes (Signed)
 Complete physical exam  Patient: Daniel Romero   DOB: 12-31-1960   63 y.o. Male  MRN: [id number]  Subjective:    Chief Complaint  Patient presents with   Annual Exam    Fasting    Daniel Romero is a 63 y.o. male who presents today for a complete physical exam.  He reports consuming a general diet. The patient does not participate in regular exercise at present. He generally feels fairly well. He reports sleeping well. Discussed the use of AI scribe software for clinical note transcription with the patient, who gave verbal consent to proceed.  History of Present Illness   He is considering options for colon cancer screening, including a colonoscopy or a Cologuard test. He had a colonoscopy 10 years ago at age 78, which was normal. He is open to using Cologuard, a non-invasive stool test, for screening.  He has a history of squamous cell carcinoma on his skin, which was treated by a dermatologist. He recently visited the dermatologist, who checked his skin and addressed seborrheic keratosis, which were removed at his request.  He has a history of atrial fibrillation, for which he underwent an ablation in 2019. He reports that since the ablation, he has not heard any clinician mention a recurrence of atrial fibrillation, and he is not currently taking any medication specifically for atrial fibrillation. He recalls being cardioverted in the past and has seen a cardiologist within the last year.  He is currently on carvedilol  and lisinopril  for hypertension. He quit smoking and drinking approximately 12 years ago.  He is concerned about his weight and is considering lifestyle changes, including cutting carbohydrates. He is interested in learning more about medical weight loss programs.  No issues with sexual function. He is semi-retired and working part-time.      Most recent fall risk assessment:    11/21/2023    3:10 PM  Fall Risk   Falls in the past year? 0  Number falls  in past yr: 0  Injury with Fall? 0  Risk for fall due to : No Fall Risks  Follow up Falls evaluation completed     Most recent depression screenings:    11/21/2023    3:10 PM 11/15/2022    3:01 PM  PHQ 2/9 Scores  PHQ - 2 Score 0 0    Dentist: [facility] within this year Dr.[place] [facility]:6 months [email or web address]: [facility]. Last week Colonoscopy:02-05-2015   Immunization History  Administered Date(s) Administered   DT (Pediatric) 11/02/1988   INFLUENZA, HIGH DOSE SEASONAL PF 05/09/2018   Influenza Split 02/19/2012   Influenza,inj,Quad PF,6+ Mos 12/31/2014   Influenza,inj,quad, With Preservative 12/31/2013   Influenza-Unspecified 12/13/2018, 05/04/2021, 01/14/2022   Moderna Covid-19 Vaccine Bivalent Booster 80yrs & up 04/02/2021   Moderna Sars-Covid-2 Vaccination 06/27/2019, 07/26/2019, 04/11/2020   PPD Test 11/02/1988   Tdap 04/13/2012, 11/15/2022   Zoster Recombinant(Shingrix) 10/19/2020, 12/29/2020    Health Maintenance  Topic Date Due   Pneumococcal Vaccine: 50+ Years (1 of 1 - PCV) Never done   COVID-19 Vaccine (5 - 2025-26 season) 12/07/2023 (Originally 11/20/2023)   INFLUENZA VACCINE  06/18/2024 (Originally 10/20/2023)   Colonoscopy  02/04/2025   DTaP/Tdap/Td (4 - Td or Tdap) 11/14/2032   Hepatitis C Screening  Completed   HIV Screening  Completed   Zoster Vaccines- Shingrix  Completed   Hepatitis B Vaccines 19-59 Average Risk  Aged Out   HPV VACCINES  Aged Out   Meningococcal B Vaccine  Aged Out  Patient Care Team: Joyce Norleen BROCKS, MD as PCP - General (Family Medicine) Court Dorn PARAS, MD as PCP - Cardiology (Cardiology) Kelsie Agent, MD (Inactive) as PCP - Electrophysiology (Cardiology)   Outpatient Medications Prior to Visit  Medication Sig   aspirin EC 81 MG tablet Take 81 mg by mouth daily. Swallow whole.   doxycycline (PERIOSTAT) 20 MG tablet Take 20 mg by mouth 2 (two) times daily.   Multiple Vitamins-Minerals (EYE VITAMINS PO) Take 2  Capfuls by mouth daily.   [DISCONTINUED] carvedilol  (COREG ) 6.25 MG tablet Take 1 tablet (6.25 mg total) by mouth 2 (two) times daily with a meal.   [DISCONTINUED] hydrocortisone  (ANUSOL -HC) 25 MG suppository Place 1 suppository (25 mg total) rectally 2 (two) times daily.   [DISCONTINUED] lisinopril -hydrochlorothiazide  (ZESTORETIC ) 10-12.5 MG tablet Take 1 tablet by mouth daily.   No facility-administered medications prior to visit.    Review of Systems  All other systems reviewed and are negative.   Family and social history as well as health maintenance and immunizations was reviewed.     Objective:    BP 126/68   Pulse 66   Ht 5' 10 (1.778 m)   Wt 244 lb 9.6 oz (110.9 kg)   SpO2 98%   BMI 35.10 kg/m    Physical Exam  Alert and in no distress. Tympanic membranes and canals are normal. Pharyngeal area is normal. Neck is supple without adenopathy or thyromegaly. Cardiac exam shows a regular sinus rhythm without murmurs or gallops. Lungs are clear to auscultation.      Assessment & Plan:    Discussed health benefits of physical activity, and encouraged him to engage in regular exercise appropriate for his age and condition.  Routine general medical examination at a health care facility Assessment and Plan    Discussed colon cancer screening options. He is comfortable with Cologuard as a non-invasive option. - Order Cologuard for colon cancer screening.  Obesity Discussed weight management and lifestyle changes. He is interested in Medical Weight Loss and Wellness programs. Discussed potential insurance coverage for weight loss medications. - Refer to Medical Weight Loss and Wellness program.  Essential hypertension Blood pressure well-controlled with carvedilol  and lisinopril .  General Health Maintenance Discussed vaccinations including pneumonia and flu shots. He prefers to receive the flu shot at a pharmacy on a Friday due to potential side effects. - Discuss  pneumonia vaccination with pharmacy. - Receive flu shot at pharmacy.        Norleen Joyce, MD

## 2023-11-22 ENCOUNTER — Encounter: Payer: Self-pay | Admitting: Family Medicine

## 2023-11-22 ENCOUNTER — Ambulatory Visit: Payer: Self-pay | Admitting: Family Medicine

## 2023-11-22 LAB — CBC WITH DIFFERENTIAL/PLATELET
Basophils Absolute: 0.1 x10E3/uL (ref 0.0–0.2)
Basos: 1 %
EOS (ABSOLUTE): 0.3 x10E3/uL (ref 0.0–0.4)
Eos: 3 %
Hematocrit: 51.2 % — ABNORMAL HIGH (ref 37.5–51.0)
Hemoglobin: 17 g/dL (ref 13.0–17.7)
Immature Grans (Abs): 0.1 x10E3/uL (ref 0.0–0.1)
Immature Granulocytes: 1 %
Lymphocytes Absolute: 1.9 x10E3/uL (ref 0.7–3.1)
Lymphs: 21 %
MCH: 29.6 pg (ref 26.6–33.0)
MCHC: 33.2 g/dL (ref 31.5–35.7)
MCV: 89 fL (ref 79–97)
Monocytes Absolute: 0.6 x10E3/uL (ref 0.1–0.9)
Monocytes: 6 %
Neutrophils Absolute: 6.5 x10E3/uL (ref 1.4–7.0)
Neutrophils: 68 %
Platelets: 354 x10E3/uL (ref 150–450)
RBC: 5.75 x10E6/uL (ref 4.14–5.80)
RDW: 13 % (ref 11.6–15.4)
WBC: 9.4 x10E3/uL (ref 3.4–10.8)

## 2023-11-22 LAB — LIPID PANEL
Cholesterol, Total: 210 mg/dL — AB (ref 100–199)
HDL: 47 mg/dL (ref >39)
LDL CALC COMMENT:: 4.5 ratio (ref 0.0–5.0)
LDL Chol Calc (NIH): 138 mg/dL — AB (ref 0–99)
Triglycerides: 137 mg/dL (ref 0–149)
VLDL Cholesterol Cal: 25 mg/dL (ref 5–40)

## 2023-11-22 LAB — COMPREHENSIVE METABOLIC PANEL WITH GFR
ALT: 31 IU/L (ref 0–44)
AST: 26 IU/L (ref 0–40)
Albumin: 4.6 g/dL (ref 3.8–4.9)
Alkaline Phosphatase: 81 IU/L (ref 44–121)
BUN/Creatinine Ratio: 14 (ref 10–24)
BUN: 12 mg/dL (ref 8–27)
Bilirubin Total: 0.6 mg/dL (ref 0.0–1.2)
CO2: 22 mmol/L (ref 20–29)
Calcium: 9.5 mg/dL (ref 8.6–10.2)
Chloride: 99 mmol/L (ref 96–106)
Creatinine, Ser: 0.85 mg/dL (ref 0.76–1.27)
Globulin, Total: 2.4 g/dL (ref 1.5–4.5)
Glucose: 96 mg/dL (ref 70–99)
Potassium: 4.1 mmol/L (ref 3.5–5.2)
Sodium: 136 mmol/L (ref 134–144)
Total Protein: 7 g/dL (ref 6.0–8.5)
eGFR: 99 mL/min/1.73 (ref >59)

## 2023-12-03 DIAGNOSIS — Z1211 Encounter for screening for malignant neoplasm of colon: Secondary | ICD-10-CM | POA: Diagnosis not present

## 2023-12-05 ENCOUNTER — Encounter (INDEPENDENT_AMBULATORY_CARE_PROVIDER_SITE_OTHER): Payer: Self-pay

## 2023-12-08 LAB — COLOGUARD: COLOGUARD: POSITIVE — AB

## 2023-12-11 NOTE — Progress Notes (Signed)
Called Patient and LVM to call back

## 2023-12-12 NOTE — Progress Notes (Signed)
 Called Patient to go over results and LVM for him to call back.

## 2023-12-13 NOTE — Progress Notes (Signed)
 He's on vacation, he is going to call when he gets back.

## 2023-12-14 ENCOUNTER — Other Ambulatory Visit: Payer: Self-pay

## 2023-12-14 DIAGNOSIS — R195 Other fecal abnormalities: Secondary | ICD-10-CM

## 2023-12-14 NOTE — Progress Notes (Signed)
 I put in referral to Dr. Renaye Sous for Colonoscopy, Just so that we can get the ball rolling with it.

## 2023-12-19 ENCOUNTER — Telehealth: Payer: Self-pay | Admitting: Internal Medicine

## 2023-12-19 DIAGNOSIS — R195 Other fecal abnormalities: Secondary | ICD-10-CM | POA: Insufficient documentation

## 2023-12-19 NOTE — Telephone Encounter (Signed)
 I looked at his chart he has a positive Cologuard test please go ahead and schedule a direct colonoscopy for that reason   Encounter Diagnosis  Name Primary?   Positive colorectal cancer screening using Cologuard test Yes

## 2023-12-19 NOTE — Telephone Encounter (Signed)
 Good morning Dr. Avram,   I received a call from this patient stating that he has a referral with us  to have a colonoscopy done. I did advise the patient that he was not due to have a colonoscopy until November of 2026. Patient stated that he is having blood in his stool. Patient is requesting to have a sooner colonoscopy. Would you please advise on scheduling this patient.    Thank you.

## 2023-12-19 NOTE — Progress Notes (Signed)
 Spoke with Pt and let him know results. He was referred to Dr. Kristie however he was denied due to being patient at [facility]. So he is awaiting appt with [facility].

## 2024-02-19 NOTE — Progress Notes (Deleted)
 Chief Complaint: Bleeding  HPI:    Daniel Romero is a 63 year old male with a past medical history as listed below including A-fib on aspirin, who was referred to me by Joyce Norleen BROCKS, MD for a complaint of bleeding.    02/05/2015 colonoscopy was normal with good prep.  Repeat recommended 10 years.    07/25/2018 echo with LVEF 55-60%.  Mild thickening of the mitral valve and otherwise normal.    11/21/2023 CMP normal, CBC with hematocrit elevated 51.2 and otherwise normal.    12/03/2023 Cologuard positive.  Past Medical History:  Diagnosis Date   Anginal pain    Chronic systolic dysfunction of left ventricle    Depression    Hypertension    Obesity    Persistent atrial fibrillation (HCC)    Seasonal allergies    Skin cancer 04/2018   BACK    Past Surgical History:  Procedure Laterality Date   ATRIAL FIBRILLATION ABLATION  05/08/2018   ATRIAL FIBRILLATION ABLATION N/A 05/08/2018   Procedure: ATRIAL FIBRILLATION ABLATION;  Surgeon: Kelsie Agent, MD;  Location: [facility] INVASIVE CV LAB;  Service: Cardiovascular;  Laterality: N/A;   CARDIOVERSION N/A 12/01/2017   Procedure: CARDIOVERSION;  Surgeon: Rolan Ezra RAMAN, MD;  Location: [facility] ENDOSCOPY;  Service: Cardiovascular;  Laterality: N/A;   CARDIOVERSION N/A 03/01/2018   Procedure: CARDIOVERSION;  Surgeon: Loni Soyla LABOR, MD;  Location: [facility] ENDOSCOPY;  Service: Cardiovascular;  Laterality: N/A;   CARDIOVERSION N/A 04/06/2018   Procedure: CARDIOVERSION;  Surgeon: Alveta Aleene PARAS, MD;  Location: [facility] ENDOSCOPY;  Service: Cardiovascular;  Laterality: N/A;   COLONOSCOPY  ~ 2015   SKIN BIOPSY Left 03/30/2018   squamous cell carcinoma in situ   WISDOM TOOTH EXTRACTION      Current Outpatient Medications  Medication Sig Dispense Refill   aspirin EC 81 MG tablet Take 81 mg by mouth daily. Swallow whole.     carvedilol  (COREG ) 6.25 MG tablet Take 1 tablet (6.25 mg total) by mouth 2 (two) times daily with a meal. 180 tablet 1   doxycycline  (PERIOSTAT) 20 MG tablet Take 20 mg by mouth 2 (two) times daily.     lisinopril -hydrochlorothiazide  (ZESTORETIC ) 10-12.5 MG tablet Take 1 tablet by mouth daily. 90 tablet 1   Multiple Vitamins-Minerals (EYE VITAMINS PO) Take 2 Capfuls by mouth daily.     No current facility-administered medications for this visit.    Allergies as of 02/20/2024   (No Known Allergies)    Family History  Problem Relation Age of Onset   Heart disease Mother        chf   Stroke Father    Heart disease Father    Diabetes Father    Colon cancer Neg Hx    Esophageal cancer Neg Hx    Stomach cancer Neg Hx    Rectal cancer Neg Hx     Social History   Socioeconomic History   Marital status: Single    Spouse name: Not on file   Number of children: Not on file   Years of education: Not on file   Highest education level: Bachelor's degree (e.g., BA, AB, BS)  Occupational History   Not on file  Tobacco Use   Smoking status: Former    Current packs/day: 0.00    Average packs/day: 0.8 packs/day for 5.0 years (3.8 ttl pk-yrs)    Types: Cigarettes    Start date: 05/20/2008    Quit date: 05/20/2013    Years since quitting: 10.7   Smokeless tobacco:  Never   Tobacco comments:    Former smoker 04/19/22  Vaping Use   Vaping status: Never Used  Substance and Sexual Activity   Alcohol use: Not Currently   Drug use: Not Currently   Sexual activity: Yes  Other Topics Concern   Not on file  Social History Narrative   Not on file   Social Drivers of Health   Financial Resource Strain: Low Risk  (11/21/2023)   Overall Financial Resource Strain (CARDIA)    Difficulty of Paying Living Expenses: Not very hard  Food Insecurity: No Food Insecurity (11/21/2023)   Hunger Vital Sign    Worried About Running Out of Food in the Last Year: Never true    Ran Out of Food in the Last Year: Never true  Transportation Needs: No Transportation Needs (11/21/2023)   PRAPARE - Administrator, Civil Service  (Medical): No    Lack of Transportation (Non-Medical): No  Physical Activity: Inactive (11/21/2023)   Exercise Vital Sign    Days of Exercise per Week: 0 days    Minutes of Exercise per Session: Not on file  Stress: No Stress Concern Present (11/21/2023)   [employer] of Occupational Health - Occupational Stress Questionnaire    Feeling of Stress: Only a little  Social Connections: Moderately Integrated (11/21/2023)   Social Connection and Isolation Panel    Frequency of Communication with Friends and Family: More than three times a week    Frequency of Social Gatherings with Friends and Family: More than three times a week    Attends Religious Services: More than 4 times per year    Active Member of [employer] or Organizations: Yes    Attends Banker Meetings: More than 4 times per year    Marital Status: Never married  Intimate Partner Violence: Not At Risk (11/15/2022)   Humiliation, Afraid, Rape, and Kick questionnaire    Fear of Current or Ex-Partner: No    Emotionally Abused: No    Physically Abused: No    Sexually Abused: No    Review of Systems:    Constitutional: No weight loss, fever, chills, weakness or fatigue HEENT: Eyes: No change in vision               Ears, Nose, Throat:  No change in hearing or congestion Skin: No rash or itching Cardiovascular: No chest pain, chest pressure or palpitations   Respiratory: No SOB or cough Gastrointestinal: See HPI and otherwise negative Genitourinary: No dysuria or change in urinary frequency Neurological: No headache, dizziness or syncope Musculoskeletal: No new muscle or joint pain Hematologic: No bleeding or bruising Psychiatric: No history of depression or anxiety    Physical Exam:  Vital signs: There were no vitals taken for this visit.  Constitutional:   Pleasant Caucasian male appears to be in NAD, Well developed, Well nourished, alert and cooperative Head:  Normocephalic and atraumatic. Eyes:   PEERL,  EOMI. No icterus. Conjunctiva pink. Ears:  Normal auditory acuity. Neck:  Supple Throat: Oral cavity and pharynx without inflammation, swelling or lesion.  Respiratory: Respirations even and unlabored. Lungs clear to auscultation bilaterally.   No wheezes, crackles, or rhonchi.  Cardiovascular: Normal S1, S2. No MRG. Regular rate and rhythm. No peripheral edema, cyanosis or pallor.  Gastrointestinal:  Soft, nondistended, nontender. No rebound or guarding. Normal bowel sounds. No appreciable masses or hepatomegaly. Rectal:  Not performed.  Msk:  Symmetrical without gross deformities. Without edema, no deformity or joint abnormality.  Neurologic:  Alert and  oriented x4;  grossly normal neurologically.  Skin:   Dry and intact without significant lesions or rashes. Psychiatric: Oriented to person, place and time. Demonstrates good judgement and reason without abnormal affect or behaviors.  RELEVANT LABS AND IMAGING: CBC    Component Value Date/Time   WBC 9.4 11/21/2023 1612   WBC 9.0 05/14/2018 2157   RBC 5.75 11/21/2023 1612   RBC 5.03 05/14/2018 2157   HGB 17.0 11/21/2023 1612   HCT 51.2 (H) 11/21/2023 1612   PLT 354 11/21/2023 1612   MCV 89 11/21/2023 1612   MCH 29.6 11/21/2023 1612   MCH 30.0 05/14/2018 2157   MCHC 33.2 11/21/2023 1612   MCHC 33.3 05/14/2018 2157   RDW 13.0 11/21/2023 1612   LYMPHSABS 1.9 11/21/2023 1612   MONOABS 606 07/21/2016 1435   EOSABS 0.3 11/21/2023 1612   BASOSABS 0.1 11/21/2023 1612    CMP     Component Value Date/Time   NA 136 11/21/2023 1612   K 4.1 11/21/2023 1612   CL 99 11/21/2023 1612   CO2 22 11/21/2023 1612   GLUCOSE 96 11/21/2023 1612   GLUCOSE 176 (H) 05/14/2018 2157   BUN 12 11/21/2023 1612   CREATININE 0.85 11/21/2023 1612   CREATININE 1.02 07/21/2016 1435   CALCIUM 9.5 11/21/2023 1612   PROT 7.0 11/21/2023 1612   ALBUMIN 4.6 11/21/2023 1612   AST 26 11/21/2023 1612   ALT 31 11/21/2023 1612   ALKPHOS 81 11/21/2023 1612    BILITOT 0.6 11/21/2023 1612   GFRNONAA 84 10/17/2019 1526   GFRAA 97 10/17/2019 1526    Assessment: 1.  Positive Cologuard: Done 12/03/2023, last colonoscopy in 2016 was normal  Plan: 1. ***     Daniel Failing, PA-C [facility] 02/19/2024, 3:55 PM  Cc: Joyce Norleen BROCKS, MD

## 2024-02-20 ENCOUNTER — Ambulatory Visit: Admitting: Physician Assistant

## 2024-02-21 ENCOUNTER — Encounter: Payer: Self-pay | Admitting: Gastroenterology

## 2024-02-21 ENCOUNTER — Ambulatory Visit: Admitting: Gastroenterology

## 2024-02-21 VITALS — BP 114/68 | HR 74 | Ht 70.0 in | Wt 244.1 lb

## 2024-02-21 DIAGNOSIS — R195 Other fecal abnormalities: Secondary | ICD-10-CM | POA: Diagnosis not present

## 2024-02-21 DIAGNOSIS — Z83719 Family history of colon polyps, unspecified: Secondary | ICD-10-CM

## 2024-02-21 DIAGNOSIS — K625 Hemorrhage of anus and rectum: Secondary | ICD-10-CM

## 2024-02-21 MED ORDER — NA SULFATE-K SULFATE-MG SULF 17.5-3.13-1.6 GM/177ML PO SOLN: 1.0000 | Freq: Once | ORAL | 0 refills | Status: AC

## 2024-02-21 NOTE — Patient Instructions (Signed)
 _______________________________________________________  If your blood pressure at your visit was 140/90 or greater, please contact your primary care physician to follow up on this.  _______________________________________________________  If you are age 61 or older, your body mass index should be between 23-30. Your Body mass index is 35.03 kg/m. If this is out of the aforementioned range listed, please consider follow up with your Primary Care Provider.  If you are age 64 or younger, your body mass index should be between 19-25. Your Body mass index is 35.03 kg/m. If this is out of the aformentioned range listed, please consider follow up with your Primary Care Provider.   ________________________________________________________  The [facility] providers would like to encourage you to use MYCHART to communicate with providers for non-urgent requests or questions.  Due to long hold times on the telephone, sending your provider a message by [facility] may be a faster and more efficient way to get a response.  Please allow 48 business hours for a response.  Please remember that this is for non-urgent requests.  _______________________________________________________  [facility] is using a team-based approach to care.  Your team is made up of your doctor and two to three APPS. Our APPS (Nurse Practitioners and Physician Assistants) work with your physician to ensure care continuity for you. They are fully qualified to address your health concerns and develop a treatment plan. They communicate directly with your gastroenterologist to care for you. Seeing the Advanced Practice Practitioners on your physician's team can help you by facilitating care more promptly, often allowing for earlier appointments, access to diagnostic testing, procedures, and other specialty referrals.   You have been scheduled for a colonoscopy. Please follow written instructions given to you at your visit today.    If you use inhalers (even only as needed), please bring them with you on the day of your procedure.  DO NOT TAKE 7 DAYS PRIOR TO TEST- Trulicity (dulaglutide) Ozempic, Wegovy (semaglutide) Mounjaro, Zepbound (tirzepatide) Bydureon Bcise (exanatide extended release)  DO NOT TAKE 1 DAY PRIOR TO YOUR TEST Rybelsus (semaglutide) Adlyxin (lixisenatide) Victoza (liraglutide) Byetta (exanatide) ___________________________________________________________________________   Due to recent changes in healthcare laws, you may see the results of your imaging and laboratory studies on MyChart before your provider has had a chance to review them.  We understand that in some cases there may be results that are confusing or concerning to you. Not all laboratory results come back in the same time frame and the provider may be waiting for multiple results in order to interpret others.  Please give us  48 hours in order for your provider to thoroughly review all the results before contacting the office for clarification of your results.

## 2024-02-21 NOTE — Progress Notes (Signed)
 Daniel Romero [id number] 1961/02/10   Chief Complaint: Positive Cologuard, blood in stool  Referring Provider: Joyce Norleen BROCKS, MD Primary GI MD: Dr. Avram  HPI: Daniel Romero is a 63 y.o. male with past medical history of A-fib with history of cardioversion and ablation, on aspirin, depression, HTN, obesity who presents today for a complaint of blood in his stool and a positive Cologuard test.    02/05/2015 colonoscopy was normal with good prep.  Repeat recommended 10 years.  07/25/2018 echo with LVEF 55-60%.  Mild thickening of the mitral valve and otherwise normal.  Last visit with cardiology 04/19/2022 for A-fib follow-up.  Low CV score and not on anticoagulation.  Advised to follow-up in a year.  Seen at family medicine 06/27/2023 for complaint of rectal bleeding which occurred on 3 separate occasions, bright red blood on the toilet paper.  No obvious abnormality on rectal exam.  He was started on Anusol  suppositories and advised that if he had any additional bleeding in the next 2 to 3 months he could be referred to GI for updated colonoscopy.  11/21/2023 CMP normal, CBC with hematocrit elevated 51.2 and otherwise normal.  12/03/2023 Cologuard positive.   Discussed the use of AI scribe software for clinical note transcription with the patient, who gave verbal consent to proceed.  History of Present Illness Daniel Romero is a 63 year old male who presents with blood in stool and a positive Cologuard test.  Rectal bleeding - First episode in April 2025 with blood in stool turning toilet water pink, lasting three days with a possible skipped day - Recent episodes of small amount of blood on toilet paper, not in stool - No pain with bowel movements, rectal pain, or irritation  Colorectal cancer screening - Positive Cologuard test  Abdominal sensations - Intermittent sensation in lower abdomen over the past month, described as muscular and surface-level - No  associated abdominal pain - Sensation not affected by movement or bowel movements - Possibly related to wearing belt  Bowel habits - Regular daily bowel movements - No constipation or diarrhea - Stool consistency varies with diet, no significant change in bowel habits  Cardiac history - Atrial fibrillation, status post ablation which was successful - Not on blood thinners except for aspirin use - No new chest pain or shortness of breath   Previous GI Procedures/Imaging   Colonoscopy 02/05/2015 Normal colonoscopy, good prep, for screening Repeat 10 years   Past Medical History:  Diagnosis Date   Anginal pain    Chronic systolic dysfunction of left ventricle    Depression    Hypertension    Obesity    Persistent atrial fibrillation (HCC)    Seasonal allergies    Skin cancer 04/2018   BACK    Past Surgical History:  Procedure Laterality Date   ATRIAL FIBRILLATION ABLATION  05/08/2018   ATRIAL FIBRILLATION ABLATION N/A 05/08/2018   Procedure: ATRIAL FIBRILLATION ABLATION;  Surgeon: Kelsie Agent, MD;  Location: [facility] INVASIVE CV LAB;  Service: Cardiovascular;  Laterality: N/A;   CARDIOVERSION N/A 12/01/2017   Procedure: CARDIOVERSION;  Surgeon: Rolan Ezra RAMAN, MD;  Location: [facility] ENDOSCOPY;  Service: Cardiovascular;  Laterality: N/A;   CARDIOVERSION N/A 03/01/2018   Procedure: CARDIOVERSION;  Surgeon: Loni Soyla LABOR, MD;  Location: [facility] ENDOSCOPY;  Service: Cardiovascular;  Laterality: N/A;   CARDIOVERSION N/A 04/06/2018   Procedure: CARDIOVERSION;  Surgeon: Alveta Aleene PARAS, MD;  Location: [facility] ENDOSCOPY;  Service: Cardiovascular;  Laterality: N/A;   COLONOSCOPY  ~  2015   SKIN BIOPSY Left 03/30/2018   squamous cell carcinoma in situ   WISDOM TOOTH EXTRACTION      Current Outpatient Medications  Medication Sig Dispense Refill   aspirin EC 81 MG tablet Take 81 mg by mouth daily. Swallow whole.     carvedilol  (COREG ) 6.25 MG tablet Take 1 tablet (6.25 mg total) by mouth 2  (two) times daily with a meal. 180 tablet 1   doxycycline (PERIOSTAT) 20 MG tablet Take 20 mg by mouth 2 (two) times daily.     lisinopril -hydrochlorothiazide  (ZESTORETIC ) 10-12.5 MG tablet Take 1 tablet by mouth daily. 90 tablet 1   Multiple Vitamins-Minerals (EYE VITAMINS PO) Take 2 Capfuls by mouth daily.     No current facility-administered medications for this visit.    Allergies as of 02/21/2024   (No Known Allergies)    Family History  Problem Relation Age of Onset   Heart disease Mother        chf   Stroke Father    Heart disease Father    Diabetes Father    Colon cancer Neg Hx    Esophageal cancer Neg Hx    Stomach cancer Neg Hx    Rectal cancer Neg Hx     Social History   Tobacco Use   Smoking status: Former    Current packs/day: 0.00    Average packs/day: 0.8 packs/day for 5.0 years (3.8 ttl pk-yrs)    Types: Cigarettes    Start date: 05/20/2008    Quit date: 05/20/2013    Years since quitting: 10.7   Smokeless tobacco: Never   Tobacco comments:    Former smoker 04/19/22  Vaping Use   Vaping status: Never Used  Substance Use Topics   Alcohol use: Not Currently   Drug use: Not Currently     Review of Systems:    Constitutional: No weight loss, fever, chills Cardiovascular: No chest pain Respiratory: No SOB  Gastrointestinal: See HPI and otherwise negative   Physical Exam:  Vital signs: BP 114/68   Pulse 74   Ht 5' 10 (1.778 m)   Wt 244 lb 2 oz (110.7 kg)   BMI 35.03 kg/m   Wt Readings from Last 3 Encounters:  02/21/24 244 lb 2 oz (110.7 kg)  11/21/23 244 lb 9.6 oz (110.9 kg)  06/27/23 245 lb 12.8 oz (111.5 kg)    Constitutional: Pleasant, obese male in NAD, alert and cooperative Head:  Normocephalic and atraumatic.  Eyes: No scleral icterus.  Respiratory: Respirations even and unlabored. Lungs clear to auscultation bilaterally.  No wheezes, crackles, or rhonchi.  Cardiovascular:  Regular rate and rhythm. No murmurs. No peripheral  edema. Gastrointestinal:  Soft, nondistended, nontender. No rebound or guarding. Normal bowel sounds. No appreciable masses or hepatomegaly. Rectal:  Deferred to colonoscopy Neurologic:  Alert and oriented x4;  grossly normal neurologically.  Skin:   Dry and intact without significant lesions or rashes. Psychiatric: Oriented to person, place and time. Demonstrates good judgement and reason without abnormal affect or behaviors.   RELEVANT LABS AND IMAGING: CBC    Component Value Date/Time   WBC 9.4 11/21/2023 1612   WBC 9.0 05/14/2018 2157   RBC 5.75 11/21/2023 1612   RBC 5.03 05/14/2018 2157   HGB 17.0 11/21/2023 1612   HCT 51.2 (H) 11/21/2023 1612   PLT 354 11/21/2023 1612   MCV 89 11/21/2023 1612   MCH 29.6 11/21/2023 1612   MCH 30.0 05/14/2018 2157   MCHC 33.2 11/21/2023 1612  MCHC 33.3 05/14/2018 2157   RDW 13.0 11/21/2023 1612   LYMPHSABS 1.9 11/21/2023 1612   MONOABS 606 07/21/2016 1435   EOSABS 0.3 11/21/2023 1612   BASOSABS 0.1 11/21/2023 1612    CMP     Component Value Date/Time   NA 136 11/21/2023 1612   K 4.1 11/21/2023 1612   CL 99 11/21/2023 1612   CO2 22 11/21/2023 1612   GLUCOSE 96 11/21/2023 1612   GLUCOSE 176 (H) 05/14/2018 2157   BUN 12 11/21/2023 1612   CREATININE 0.85 11/21/2023 1612   CREATININE 1.02 07/21/2016 1435   CALCIUM 9.5 11/21/2023 1612   PROT 7.0 11/21/2023 1612   ALBUMIN 4.6 11/21/2023 1612   AST 26 11/21/2023 1612   ALT 31 11/21/2023 1612   ALKPHOS 81 11/21/2023 1612   BILITOT 0.6 11/21/2023 1612   GFRNONAA 84 10/17/2019 1526   GFRAA 97 10/17/2019 1526   Echocardiogram 07/30/2018 1. The left ventricle has normal systolic function, with an ejection  fraction of 55-60%. The cavity size was normal. Left ventricular diastolic  Doppler parameters are indeterminate.   2. The right ventricle has normal systolic function. The cavity was  normal. There is no increase in right ventricular wall thickness.   3. Left atrial size was  mildly dilated.   4. Mild thickening of the mitral valve leaflet.   5. The aortic valve is tricuspid.   6. The aortic root is normal in size and structure.    Assessment/Plan:   Assessment & Plan Rectal bleeding with positive Cologuard Patient with new onset intermittent rectal bleeding this year.  Initially had 3 days of bleeding and April, was seen at family medicine and treated with Anusol  suppositories.  Symptoms resolved for a while, then had recurrence.  Mostly seeing small amount of BRBPR on toilet paper.   Had positive Cologuard in September.   Normal CBC, CMP on 11/21/2023. Last colonoscopy 2016 was normal with 10-year recall recommended. Positive Cologuard and rectal bleeding warrant colonoscopy.  Family history of colon polyps noted (father).    - Schedule colonoscopy. I thoroughly discussed the procedure with the patient to include nature of the procedure, alternatives, benefits, and risks (including but not limited to bleeding, infection, perforation, anesthesia/cardiac/pulmonary complications). Patient verbalized understanding and gave verbal consent to proceed with procedure.     Camie Furbish, PA-C [facility] 02/21/2024, 3:17 PM  Patient Care Team: Joyce Norleen BROCKS, MD as PCP - General (Family Medicine) Court Dorn PARAS, MD as PCP - Cardiology (Cardiology) Kelsie Agent, MD (Inactive) as PCP - Electrophysiology (Cardiology)

## 2024-02-22 ENCOUNTER — Encounter: Payer: Self-pay | Admitting: Internal Medicine

## 2024-02-29 ENCOUNTER — Encounter: Payer: Self-pay | Admitting: Internal Medicine

## 2024-02-29 ENCOUNTER — Ambulatory Visit: Admitting: Internal Medicine

## 2024-02-29 VITALS — BP 119/76 | HR 60 | Temp 98.4°F | Resp 12 | Ht 70.0 in | Wt 244.0 lb

## 2024-02-29 DIAGNOSIS — D123 Benign neoplasm of transverse colon: Secondary | ICD-10-CM | POA: Diagnosis not present

## 2024-02-29 DIAGNOSIS — R195 Other fecal abnormalities: Secondary | ICD-10-CM

## 2024-02-29 DIAGNOSIS — Z1211 Encounter for screening for malignant neoplasm of colon: Secondary | ICD-10-CM | POA: Diagnosis not present

## 2024-02-29 MED ORDER — SODIUM CHLORIDE 0.9 % IV SOLN: 500.0000 mL | Freq: Once | INTRAVENOUS | Status: DC

## 2024-02-29 NOTE — Progress Notes (Signed)
 Called to room to assist during endoscopic procedure.  Patient ID and intended procedure confirmed with present staff. Received instructions for my participation in the procedure from the performing physician.

## 2024-02-29 NOTE — Patient Instructions (Addendum)
 Please read handouts provided. Continue present medications. Resume previous diet. Await pathology results. Repeat colonoscopy in 1 year.   YOU HAD AN ENDOSCOPIC PROCEDURE TODAY AT THE [facility]:   Refer to the procedure report that was given to you for any specific questions about what was found during the examination.  If the procedure report does not answer your questions, please call your gastroenterologist to clarify.  If you requested that your care partner not be given the details of your procedure findings, then the procedure report has been included in a sealed envelope for you to review at your convenience later.  YOU SHOULD EXPECT: Some feelings of bloating in the abdomen. Passage of more gas than usual.  Walking can help get rid of the air that was put into your GI tract during the procedure and reduce the bloating. If you had a lower endoscopy (such as a colonoscopy or flexible sigmoidoscopy) you may notice spotting of blood in your stool or on the toilet paper. If you underwent a bowel prep for your procedure, you may not have a normal bowel movement for a few days.  Please Note:  You might notice some irritation and congestion in your nose or some drainage.  This is from the oxygen used during your procedure.  There is no need for concern and it should clear up in a day or so.  SYMPTOMS TO REPORT IMMEDIATELY:  Following lower endoscopy (colonoscopy or flexible sigmoidoscopy):  Excessive amounts of blood in the stool  Significant tenderness or worsening of abdominal pains  Swelling of the abdomen that is new, acute  Fever of 100F or higher.  For urgent or emergent issues, a gastroenterologist can be reached at any hour by calling [phone]. Do not use MyChart messaging for urgent concerns.    DIET:  We do recommend a small meal at first, but then you may proceed to your regular diet.  Drink plenty of fluids but you should avoid alcoholic beverages for 24  hours.  ACTIVITY:  You should plan to take it easy for the rest of today and you should NOT DRIVE or use heavy machinery until tomorrow (because of the sedation medicines used during the test).    FOLLOW UP: Our staff will call the number listed on your records the next business day following your procedure.  We will call around 7:15- 8:00 am to check on you and address any questions or concerns that you may have regarding the information given to you following your procedure. If we do not reach you, we will leave a message.     If any biopsies were taken you will be contacted by phone or by letter within the next 1-3 weeks.  Please call us  at [phone] if you have not heard about the biopsies in 3 weeks.    SIGNATURES/CONFIDENTIALITY: You and/or your care partner have signed paperwork which will be entered into your electronic medical record.  These signatures attest to the fact that that the information above on your After Visit Summary has been reviewed and is understood.  Full responsibility of the confidentiality of this discharge information lies with you and/or your care-partner.I found 2 small polyps that look benign. All else looked okay. Unfortunately the cleanout did not work as well as we would like.  Once I review the polyp pathology I will make recommendations regarding when to repeat a colonoscopy but based upon current expert recommendations that should be within 1 year because your prep  was only fair as opposed to adequate or better.  I appreciate the opportunity to care for you. Lupita CHARLENA Commander, MD, NOLIA

## 2024-02-29 NOTE — Progress Notes (Signed)
 History and Physical Interval Note:  02/29/2024 3:02 PM  Daniel Romero  has presented today for endoscopic procedure(s), with the diagnosis of  Encounter Diagnosis  Name Primary?   Positive colorectal cancer screening using Cologuard test Yes  .  The various methods of evaluation and treatment have been discussed with the patient and/or family. After consideration of risks, benefits and other options for treatment, the patient has consented to  the endoscopic procedure(s).   The patient's history has been reviewed, patient examined, no change in status, stable for endoscopic procedure(s).  I have reviewed the patient's chart and labs.  Questions were answered to the patient's satisfaction.     Lupita CHARLENA Commander, MD, NOLIA

## 2024-02-29 NOTE — Op Note (Signed)
 [facility] Patient Name: Daniel Romero Procedure Date: 02/29/2024 2:41 PM MRN: [id number] Endoscopist: Lupita FORBES Commander , MD, [id number] Age: 63 Referring MD:  Date of Birth: [birth year] Gender: Male Account #: [id number] Procedure:                Colonoscopy Indications:              Positive Cologuard test Medicines:                Monitored Anesthesia Care Procedure:                Pre-Anesthesia Assessment:                           - Prior to the procedure, a History and Physical                            was performed, and patient medications and                            allergies were reviewed. The patient's tolerance of                            previous anesthesia was also reviewed. The risks                            and benefits of the procedure and the sedation                            options and risks were discussed with the patient.                            All questions were answered, and informed consent                            was obtained. Prior Anticoagulants: The patient has                            taken no anticoagulant or antiplatelet agents. ASA                            Grade Assessment: III - A patient with severe                            systemic disease. After reviewing the risks and                            benefits, the patient was deemed in satisfactory                            condition to undergo the procedure.                           After obtaining informed consent, the colonoscope  was passed under direct vision. Throughout the                            procedure, the patient's blood pressure, pulse, and                            oxygen saturations were monitored continuously. The                            Olympus Scope SN: [id number] was introduced through                            the anus and advanced to the the cecum, identified                            by appendiceal orifice and  ileocecal valve. The                            colonoscopy was somewhat difficult due to poor                            endoscopic visualization. Successful completion of                            the procedure was aided by lavage. The patient                            tolerated the procedure well. The quality of the                            bowel preparation was fair. The ileocecal valve,                            appendiceal orifice, and rectum were photographed.                            The bowel preparation used was SUPREP via split                            dose instruction. Scope In: 3:05:32 PM Scope Out: 3:19:34 PM Scope Withdrawal Time: 0 hours 10 minutes 27 seconds  Total Procedure Duration: 0 hours 14 minutes 2 seconds  Findings:                 The perianal and digital rectal examinations were                            normal.                           Two sessile polyps were found in the transverse                            colon. The polyps were diminutive in size. These  polyps were removed with a cold snare. Resection                            and retrieval were complete. Verification of                            patient identification for the specimen was done.                            Estimated blood loss was minimal.                           The exam was otherwise without abnormality on                            direct and retroflexion views. Complications:            No immediate complications. Estimated Blood Loss:     Estimated blood loss was minimal. Impression:               - Preparation of the colon was fair.                           - Two diminutive polyps in the transverse colon,                            removed with a cold snare. Resected and retrieved.                           - The examination was otherwise normal on direct                            and retroflexion views. Recommendation:           - Patient  has a contact number available for                            emergencies. The signs and symptoms of potential                            delayed complications were discussed with the                            patient. Return to normal activities tomorrow.                            Written discharge instructions were provided to the                            patient.                           - Resume previous diet.                           - Continue present medications.                           -  Await pathology results. He will need a repeat                            colonoscopy within 1 year given fair prep. Lupita FORBES Commander, MD 02/29/2024 3:35:45 PM This report has been signed electronically.

## 2024-02-29 NOTE — Progress Notes (Unsigned)
 Vss nad trans to pacu

## 2024-03-01 ENCOUNTER — Telehealth: Payer: Self-pay | Admitting: *Deleted

## 2024-03-01 NOTE — Telephone Encounter (Signed)
 No answer for post procedure call back. Left VM.

## 2024-03-05 ENCOUNTER — Encounter: Payer: Self-pay | Admitting: Internal Medicine

## 2024-03-05 ENCOUNTER — Ambulatory Visit: Payer: Self-pay | Admitting: Internal Medicine

## 2024-03-05 DIAGNOSIS — Z860101 Personal history of adenomatous and serrated colon polyps: Secondary | ICD-10-CM | POA: Insufficient documentation

## 2024-03-05 LAB — SURGICAL PATHOLOGY

## 2024-11-21 ENCOUNTER — Encounter: Payer: Self-pay | Admitting: Family Medicine
